# Patient Record
Sex: Male | Born: 1950
Health system: Southern US, Community
[De-identification: ages and names within clinical notes are randomized; demographics above are authoritative.]

## PROBLEM LIST (undated history)

## (undated) ENCOUNTER — Emergency Department (HOSPITAL_COMMUNITY): Discharge status: Against Medical Advice | Payer: BC Managed Care – PPO

## (undated) DIAGNOSIS — D509 Iron deficiency anemia, unspecified: Secondary | ICD-10-CM

## (undated) DIAGNOSIS — I739 Peripheral vascular disease, unspecified: Secondary | ICD-10-CM

## (undated) DIAGNOSIS — E785 Hyperlipidemia, unspecified: Secondary | ICD-10-CM

## (undated) DIAGNOSIS — I10 Essential (primary) hypertension: Secondary | ICD-10-CM

## (undated) DIAGNOSIS — K7460 Unspecified cirrhosis of liver: Secondary | ICD-10-CM

## (undated) DIAGNOSIS — R161 Splenomegaly, not elsewhere classified: Secondary | ICD-10-CM

## (undated) DIAGNOSIS — K648 Other hemorrhoids: Secondary | ICD-10-CM

## (undated) DIAGNOSIS — N200 Calculus of kidney: Secondary | ICD-10-CM

## (undated) DIAGNOSIS — E119 Type 2 diabetes mellitus without complications: Secondary | ICD-10-CM

## (undated) DIAGNOSIS — M7918 Myalgia, other site: Secondary | ICD-10-CM

## (undated) DIAGNOSIS — D126 Benign neoplasm of colon, unspecified: Secondary | ICD-10-CM

## (undated) DIAGNOSIS — K900 Celiac disease: Secondary | ICD-10-CM

## (undated) DIAGNOSIS — G4730 Sleep apnea, unspecified: Secondary | ICD-10-CM

## (undated) DIAGNOSIS — K2990 Gastroduodenitis, unspecified, without bleeding: Secondary | ICD-10-CM

## (undated) DIAGNOSIS — T7840XA Allergy, unspecified, initial encounter: Secondary | ICD-10-CM

## (undated) HISTORY — DX: Essential (primary) hypertension: I10

## (undated) HISTORY — PX: POLYPECTOMY: SHX149

## (undated) HISTORY — PX: SPINE SURGERY: SHX786

## (undated) HISTORY — DX: Peripheral vascular disease, unspecified: I73.9

## (undated) HISTORY — DX: Other hemorrhoids: K64.8

## (undated) HISTORY — DX: Iron deficiency anemia, unspecified: D50.9

## (undated) HISTORY — PX: JOINT REPLACEMENT: SHX530

## (undated) HISTORY — DX: Benign neoplasm of colon, unspecified: D12.6

## (undated) HISTORY — DX: Type 2 diabetes mellitus without complications: E11.9

## (undated) HISTORY — DX: Calculus of kidney: N20.0

## (undated) HISTORY — DX: Gastroduodenitis, unspecified, without bleeding: K29.90

## (undated) HISTORY — PX: COLONOSCOPY: SHX174

## (undated) HISTORY — DX: Splenomegaly, not elsewhere classified: R16.1

## (undated) HISTORY — DX: Celiac disease: K90.0

## (undated) HISTORY — DX: Sleep apnea, unspecified: G47.30

## (undated) HISTORY — DX: Hyperlipidemia, unspecified: E78.5

## (undated) HISTORY — DX: Allergy, unspecified, initial encounter: T78.40XA

## (undated) HISTORY — DX: Unspecified cirrhosis of liver: K74.60

---
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
## 2000-04-05 ENCOUNTER — Encounter: Admission: RE | Admit: 2000-04-05 | Discharge: 2000-04-05 | Payer: Self-pay | Admitting: Unknown Physician Specialty

## 2000-05-31 ENCOUNTER — Encounter: Admission: RE | Admit: 2000-05-31 | Discharge: 2000-05-31 | Payer: Self-pay | Admitting: Neurosurgery

## 2000-05-31 ENCOUNTER — Encounter: Payer: Self-pay | Admitting: Neurosurgery

## 2000-06-14 ENCOUNTER — Encounter: Payer: Self-pay | Admitting: Neurosurgery

## 2000-06-14 ENCOUNTER — Encounter: Admission: RE | Admit: 2000-06-14 | Discharge: 2000-06-14 | Payer: Self-pay | Admitting: Neurosurgery

## 2002-05-08 ENCOUNTER — Encounter: Payer: Self-pay | Admitting: Internal Medicine

## 2002-05-08 ENCOUNTER — Ambulatory Visit (HOSPITAL_COMMUNITY): Admission: RE | Admit: 2002-05-08 | Discharge: 2002-05-08 | Payer: Self-pay | Admitting: Internal Medicine

## 2002-06-25 ENCOUNTER — Ambulatory Visit (HOSPITAL_COMMUNITY): Admission: RE | Admit: 2002-06-25 | Discharge: 2002-06-25 | Payer: Self-pay | Admitting: Gastroenterology

## 2002-06-25 ENCOUNTER — Encounter: Payer: Self-pay | Admitting: Gastroenterology

## 2002-07-30 ENCOUNTER — Encounter: Payer: Self-pay | Admitting: Orthopedic Surgery

## 2002-08-05 ENCOUNTER — Encounter: Payer: Self-pay | Admitting: Orthopedic Surgery

## 2002-08-05 ENCOUNTER — Inpatient Hospital Stay (HOSPITAL_COMMUNITY): Admission: RE | Admit: 2002-08-05 | Discharge: 2002-08-09 | Payer: Self-pay | Admitting: Orthopedic Surgery

## 2002-08-29 ENCOUNTER — Emergency Department (HOSPITAL_COMMUNITY): Admission: EM | Admit: 2002-08-29 | Discharge: 2002-08-29 | Payer: Self-pay | Admitting: Emergency Medicine

## 2002-08-29 ENCOUNTER — Encounter: Payer: Self-pay | Admitting: Emergency Medicine

## 2004-11-10 ENCOUNTER — Ambulatory Visit (HOSPITAL_COMMUNITY): Admission: RE | Admit: 2004-11-10 | Discharge: 2004-11-10 | Payer: Self-pay | Admitting: Family Medicine

## 2004-11-10 IMAGING — US US ABDOMEN COMPLETE
1 series · 14 of 25 positions shown · non-contrast
Comparison: none

CLINICAL DATA: Elevated LFT?s. Known fatty liver.
 ABDOMEN ULTRASOUND:
TECHNIQUE: Complete abdominal ultrasound examination was performed including evaluation of the liver, gallbladder, bile ducts, pancreas, kidneys, spleen, IVC, and abdominal aorta.

[Series 1: unknown · 0.37mm/px · 14 of 87 slices shown]
[im 1/87]
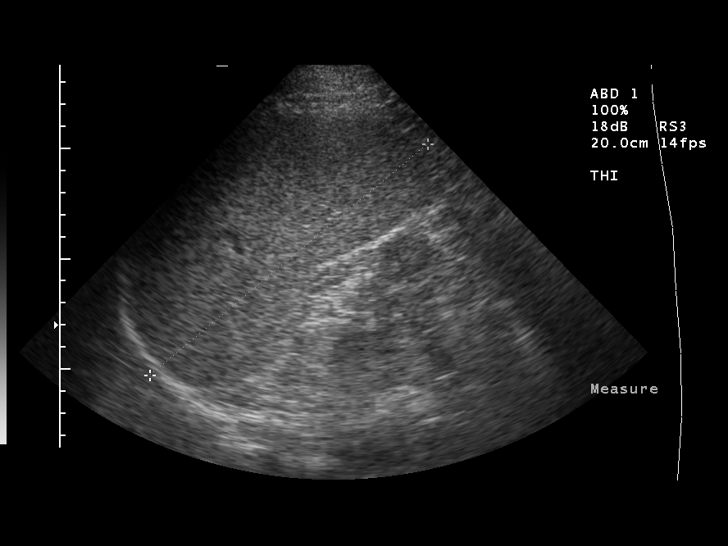
[im 8/87]
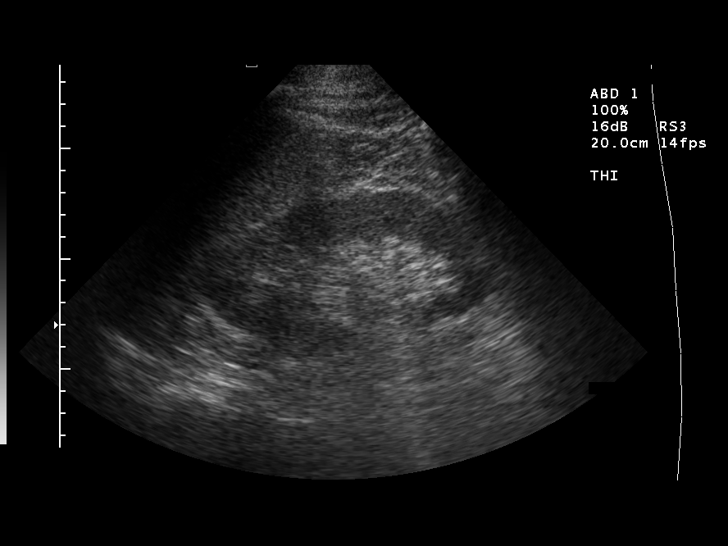
[im 15/87]
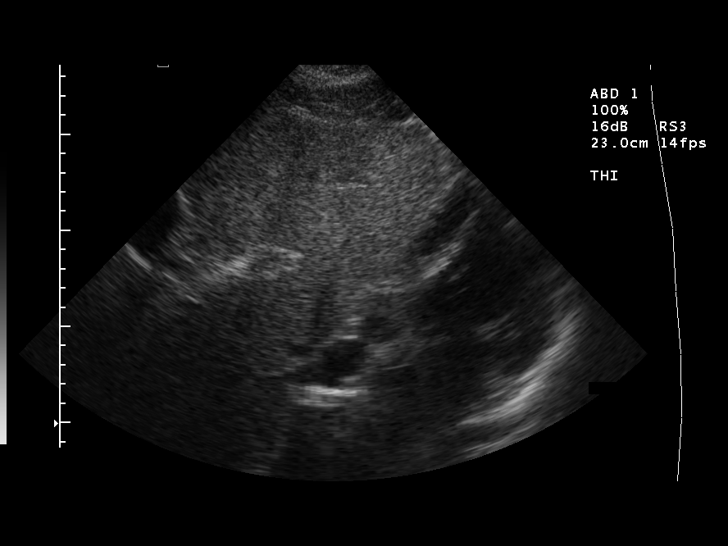
[im 22/87]
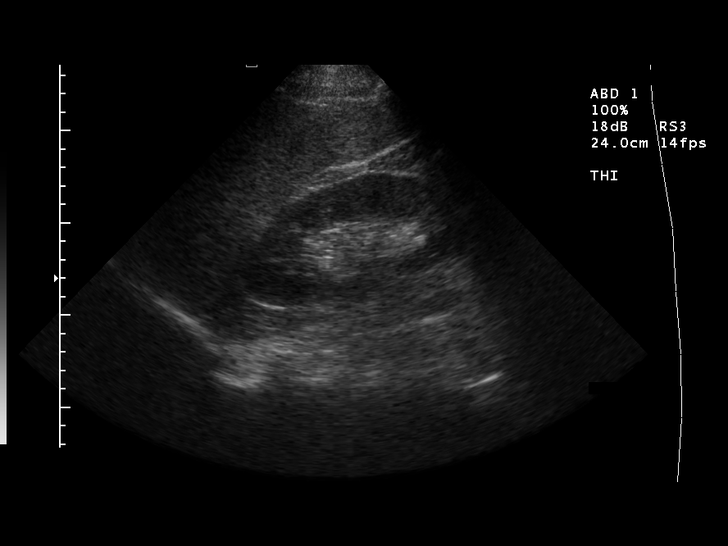
[im 29/87]
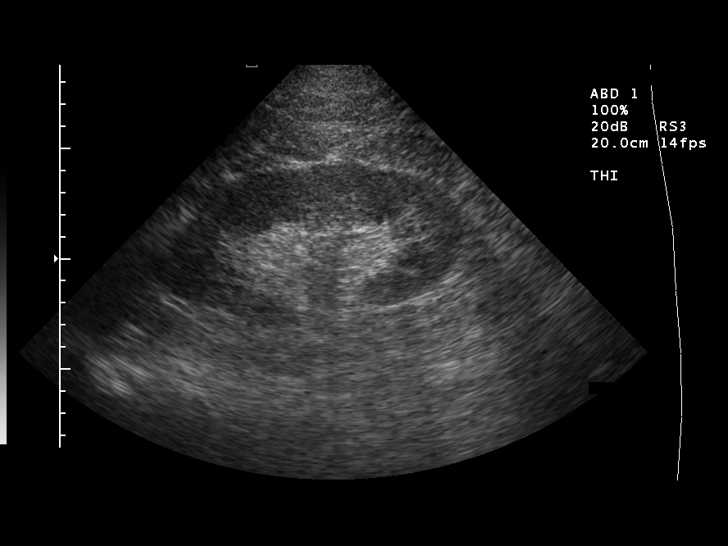
[im 33/87]
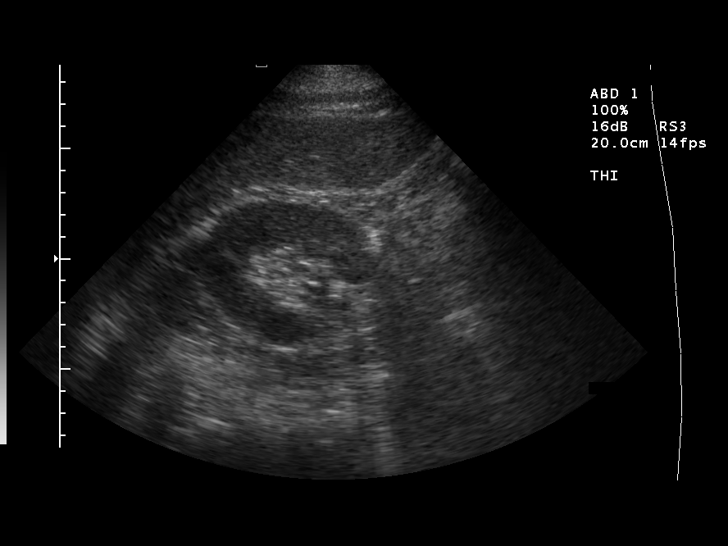
[im 40/87]
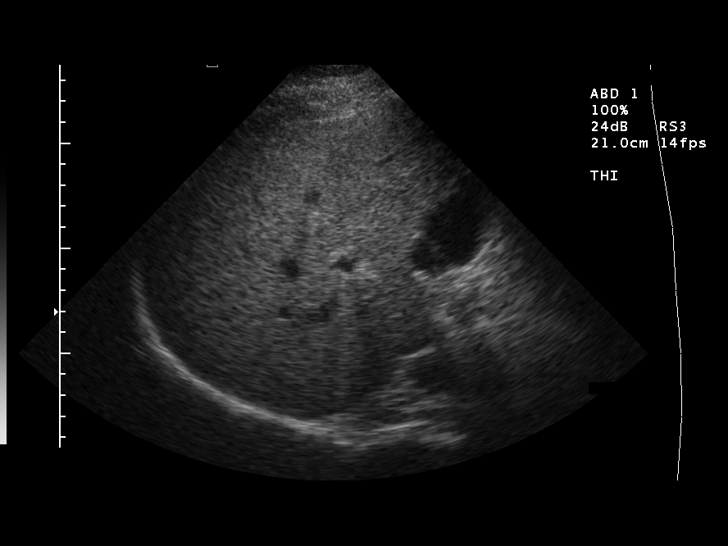
[im 47/87]
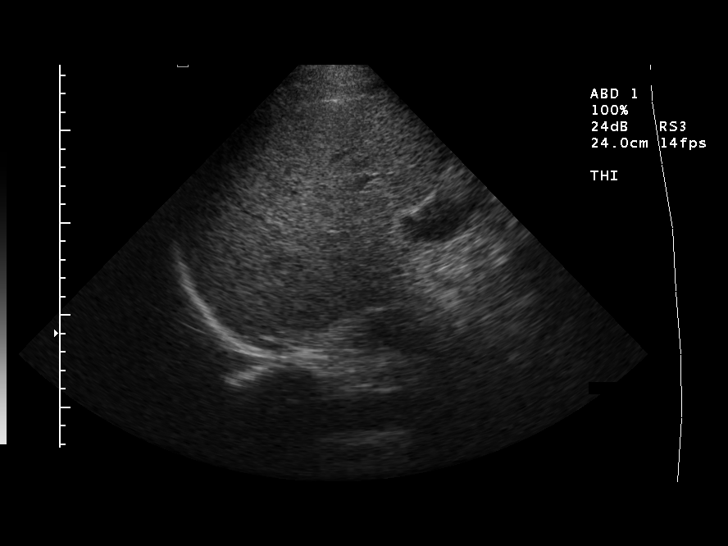
[im 54/87]
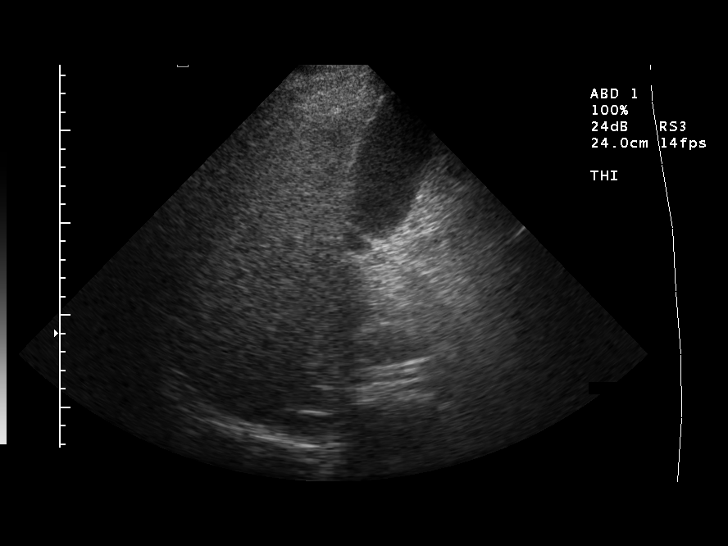
[im 58/87]
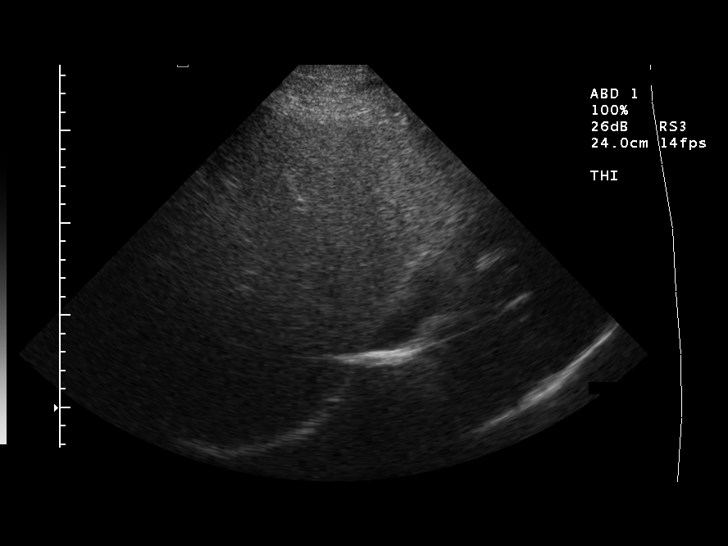
[im 65/87]
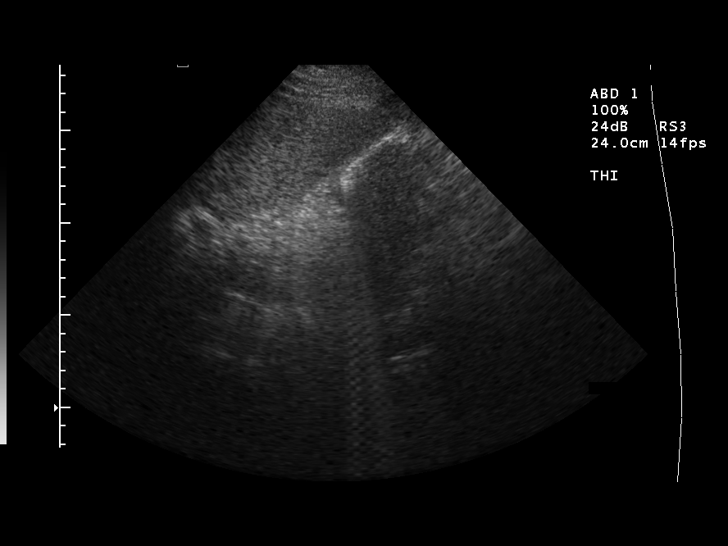
[im 72/87]
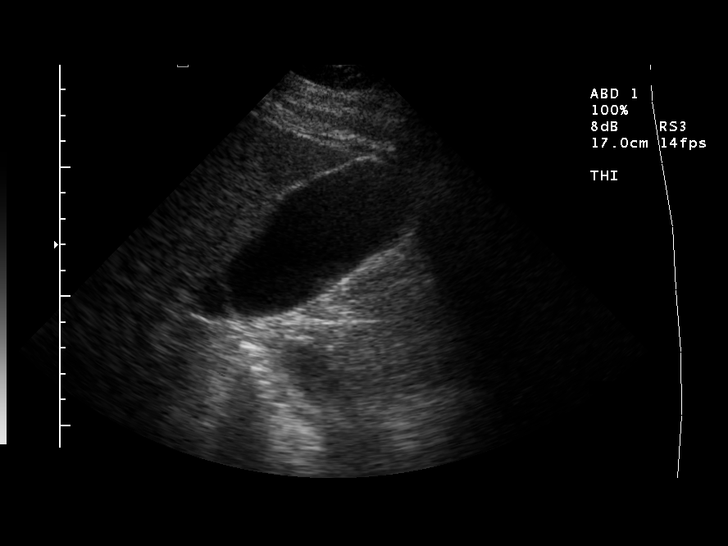
[im 79/87]
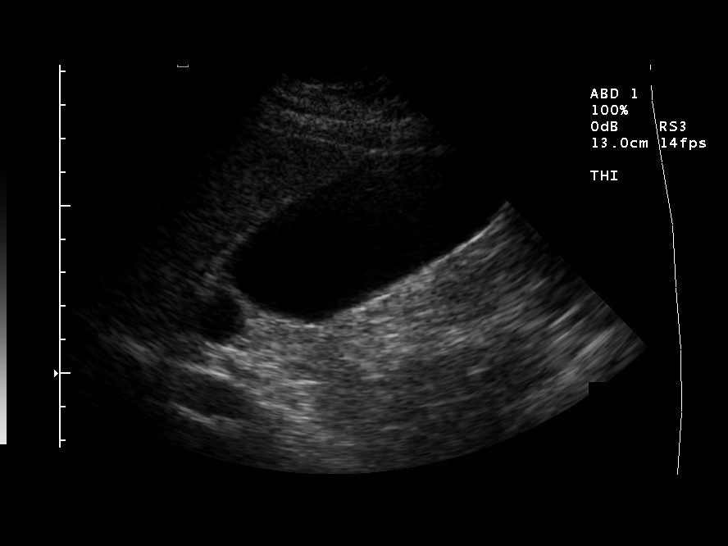
[im 87/87]
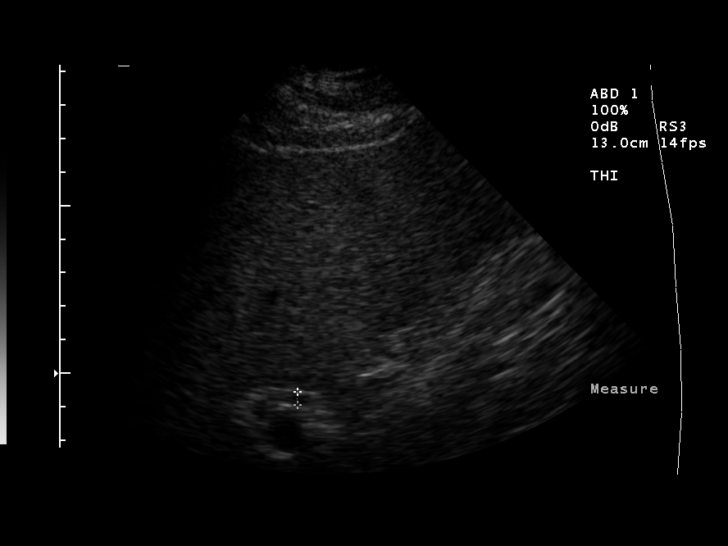

[14 of 25 positions shown; findings below may reference images not displayed]

FINDINGS: Multiple scans of the abdomen show the gallbladder to be normal with a wall thickness of 2.6 mm.  The common bile duct is normal and measures 3.9 mm.  The liver is echogenic in a generalized fashion consistent with fatty infiltration. Inferior vena cava appears normal.  The pancreas is poorly seen due to overlying gas. Spleen is slightly prominent in size and measures 16.4 cm in maximum diameter.  The right kidney measures 15.0 cm and appears normal.  The left kidney is normal and measures 13.7 cm.  The abdominal aorta is normal and measures 2.5 cm.
IMPRESSION: Liver is echogenic suggesting fatty infiltration with no focal masses. Spleen is at the upper limits of normal.  Pancreas is poorly seen due to overlying gas.

## 2006-04-19 ENCOUNTER — Ambulatory Visit (HOSPITAL_COMMUNITY): Admission: RE | Admit: 2006-04-19 | Discharge: 2006-04-19 | Payer: Self-pay | Admitting: Family Medicine

## 2006-04-19 IMAGING — US US EXTREM LOW VENOUS*R*
1 series · 14 of 22 positions shown · non-contrast
Comparison: None.

CLINICAL DATA: Swelling,  cellulitis.
 RIGHT LOWER EXTREMITY VENOUS DOPPLER ULTRASOUND:
TECHNIQUE: Gray-scale sonography with compression, as well as color and duplex Doppler ultrasound, were performed to evaluate the deep venous system from the level of the common femoral vein through the popliteal and proximal calf veins.

[Series 1: us extrem low venous*right* · 14 of 22 slices shown]
[im 1/22]
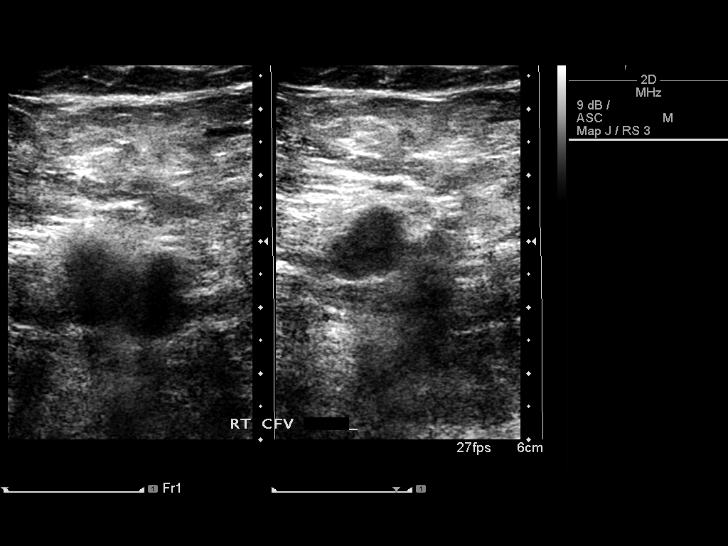
[im 3/22]
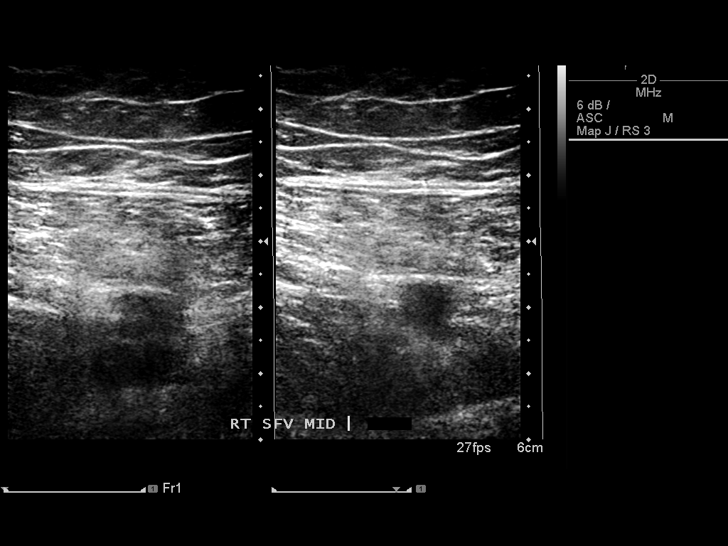
[im 4/22]
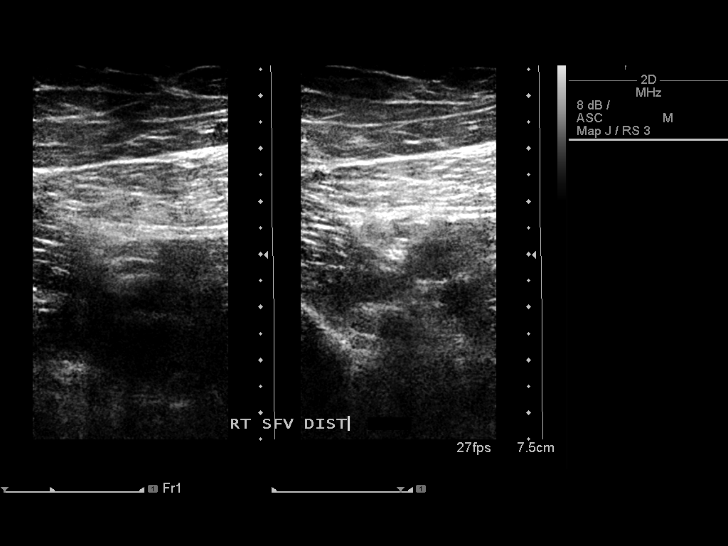
[im 6/22]
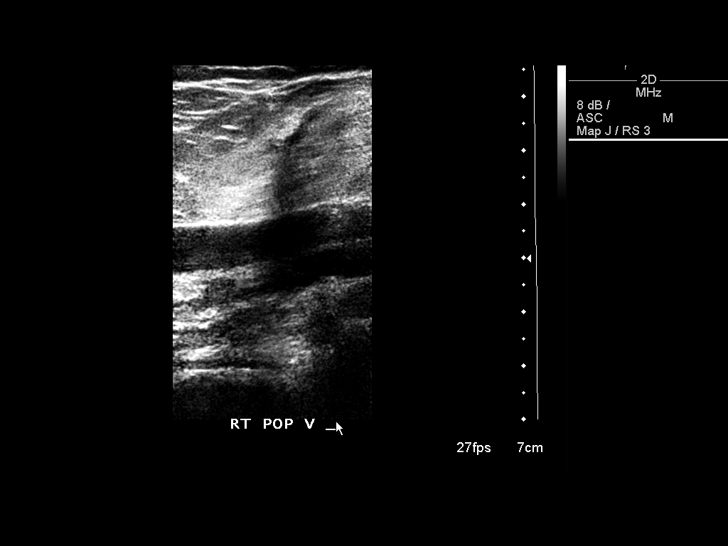
[im 8/22]
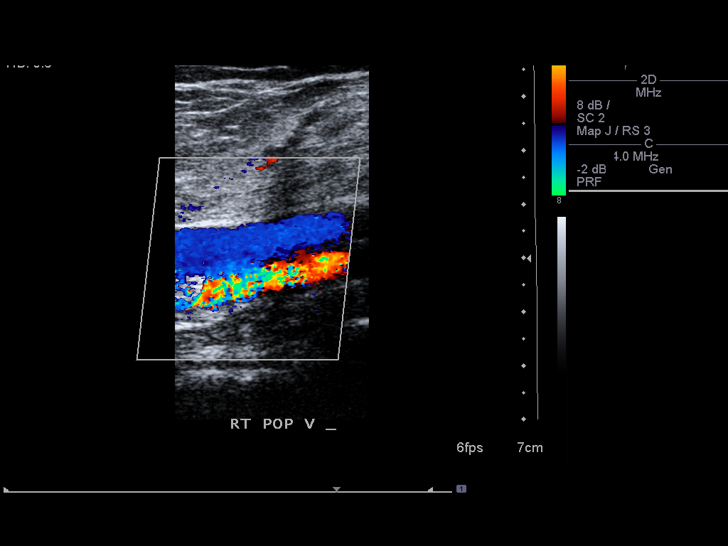
[im 9/22]
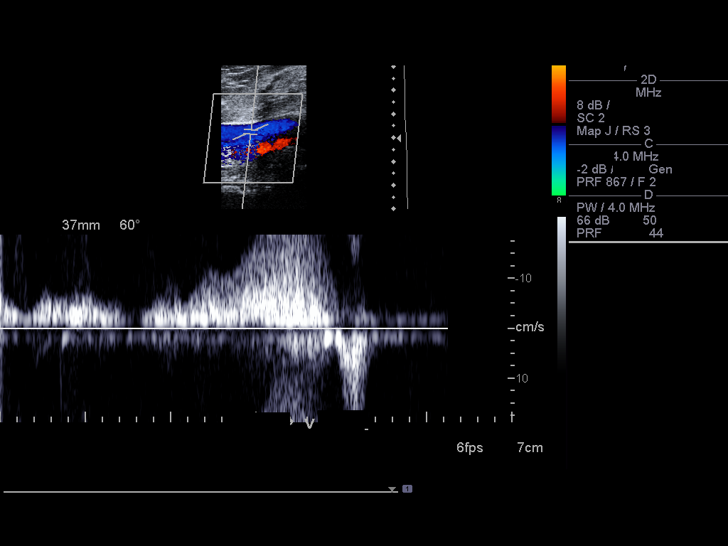
[im 11/22]
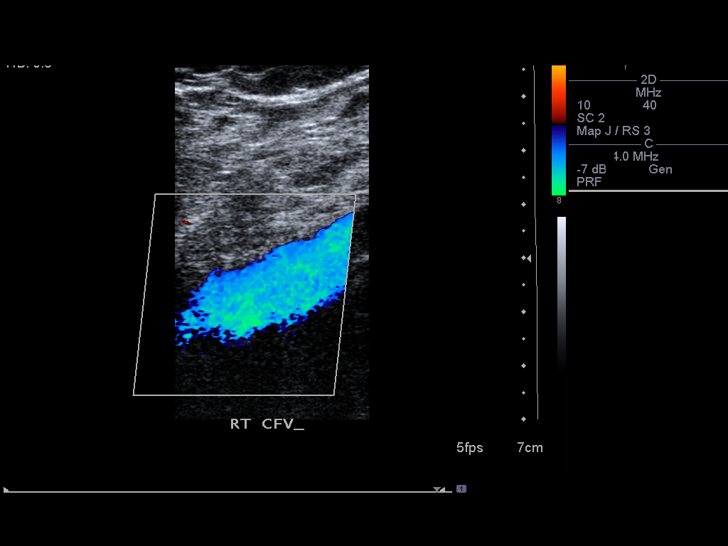
[im 12/22]
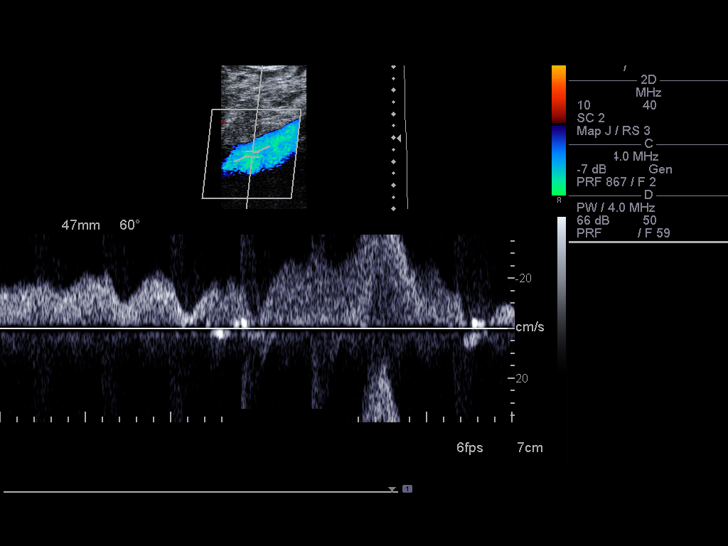
[im 14/22]
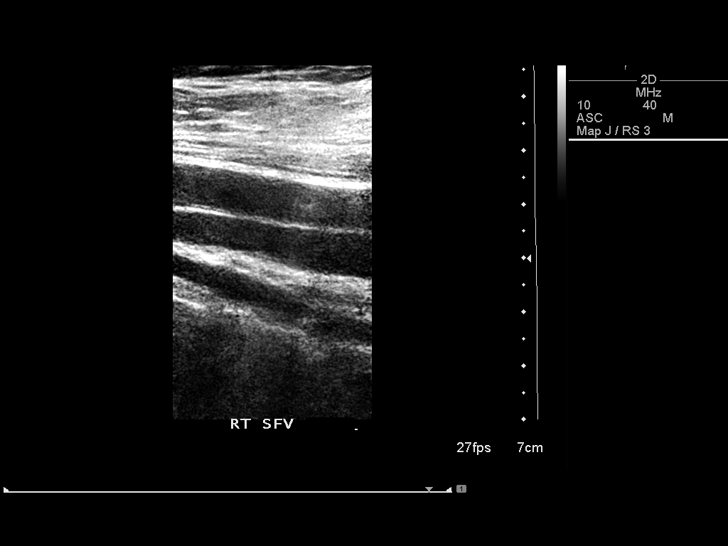
[im 15/22]
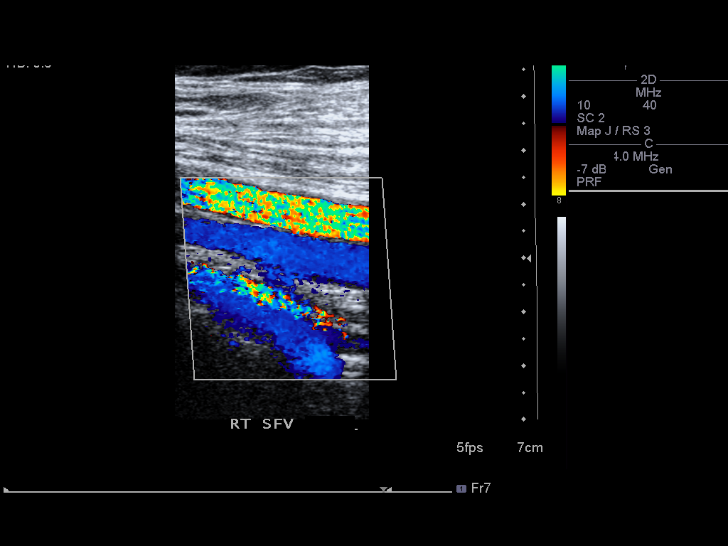
[im 17/22]
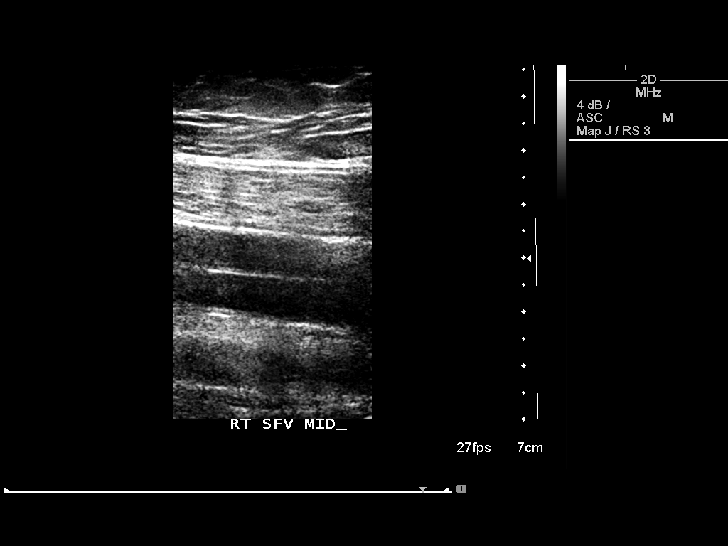
[im 19/22]
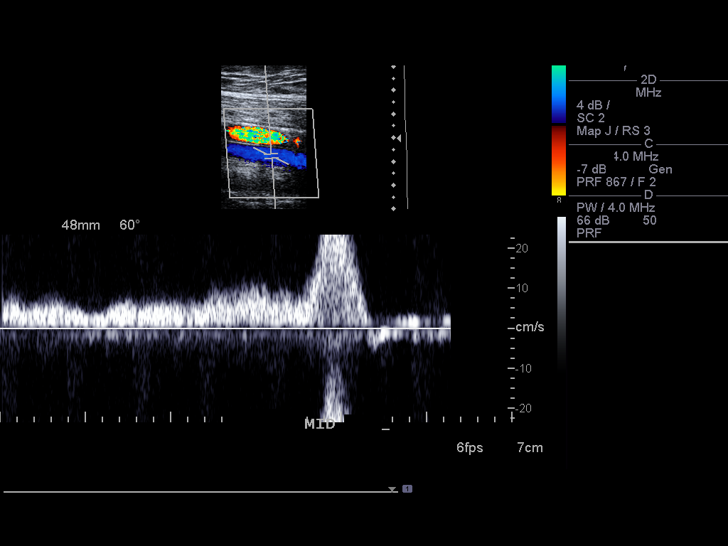
[im 20/22]
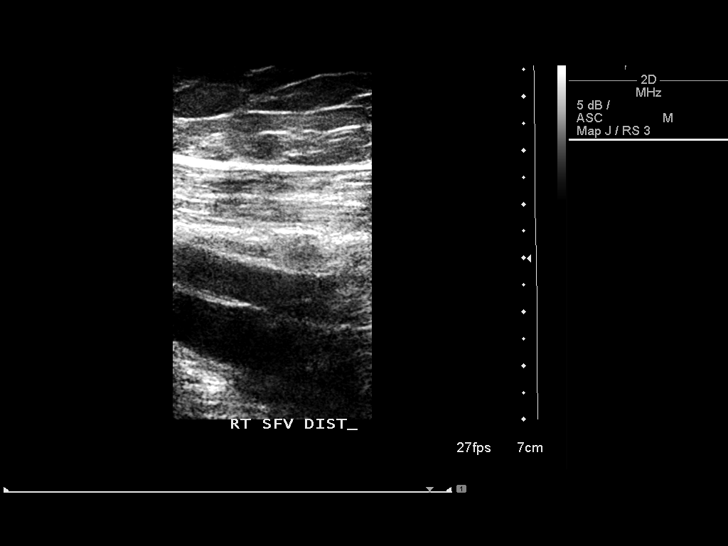
[im 22/22]
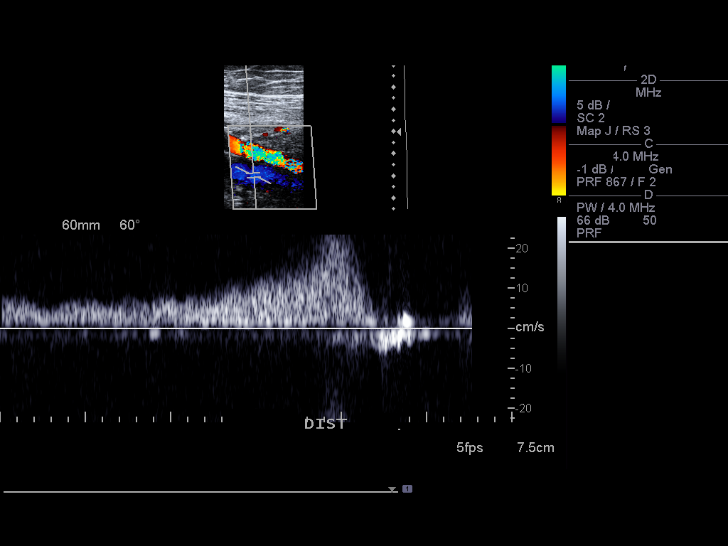

[14 of 22 positions shown; findings below may reference images not displayed]

FINDINGS: There is normal compressibility, phasicity, and augmentation of flow in the superficial and common femoral and popliteal veins.
IMPRESSION: Negative for deep venous thrombosis.

## 2012-12-08 ENCOUNTER — Ambulatory Visit (HOSPITAL_COMMUNITY)
Admission: RE | Admit: 2012-12-08 | Discharge: 2012-12-08 | Disposition: A | Discharge status: Home / Self Care | Payer: BC Managed Care – PPO | Source: Ambulatory Visit | Attending: Family Medicine | Admitting: Family Medicine

## 2012-12-08 ENCOUNTER — Other Ambulatory Visit (HOSPITAL_COMMUNITY): Payer: Self-pay | Admitting: Family Medicine

## 2012-12-08 DIAGNOSIS — N201 Calculus of ureter: Secondary | ICD-10-CM | POA: Insufficient documentation

## 2012-12-08 DIAGNOSIS — R319 Hematuria, unspecified: Secondary | ICD-10-CM | POA: Insufficient documentation

## 2012-12-08 DIAGNOSIS — N200 Calculus of kidney: Secondary | ICD-10-CM | POA: Insufficient documentation

## 2012-12-08 DIAGNOSIS — R1032 Left lower quadrant pain: Secondary | ICD-10-CM | POA: Insufficient documentation

## 2012-12-08 DIAGNOSIS — R109 Unspecified abdominal pain: Secondary | ICD-10-CM

## 2012-12-08 DIAGNOSIS — R161 Splenomegaly, not elsewhere classified: Secondary | ICD-10-CM | POA: Insufficient documentation

## 2012-12-08 DIAGNOSIS — K7689 Other specified diseases of liver: Secondary | ICD-10-CM | POA: Insufficient documentation

## 2012-12-08 DIAGNOSIS — Z87442 Personal history of urinary calculi: Secondary | ICD-10-CM | POA: Insufficient documentation

## 2012-12-08 DIAGNOSIS — K7460 Unspecified cirrhosis of liver: Secondary | ICD-10-CM | POA: Insufficient documentation

## 2012-12-08 IMAGING — CT CT ABD-PELV W/O CM
2 of 4 series · 16 of 46 positions shown, 18 images · non-contrast
Comparison: None.

ADDENDUM:
Contacted by referring physician for additional view of this study.

By report, the patient has a history of left renal abnormality.
However, no abnormality is evident on unenhanced CT (aside from the
2 mm nonobstructing lower pole calculus noted in the original
report).
A possible 13 mm cyst is present in the right lower kidney (series
2/ image 50), although poorly visualized.
CLINICAL DATA: Left flank pain, hematuria, history of kidney stones
EXAM:
CT ABDOMEN AND PELVIS WITHOUT CONTRAST
TECHNIQUE: Multidetector CT imaging of the abdomen and pelvis was performed
following the standard protocol without intravenous contrast.

[Series 2: standard/full over (age)lbs 5.0 · axial · 0.95mm/px · z∈[-515,-30]mm · 13 of 107 slices shown, 15 images]
[im 5/107  soft-tissue]
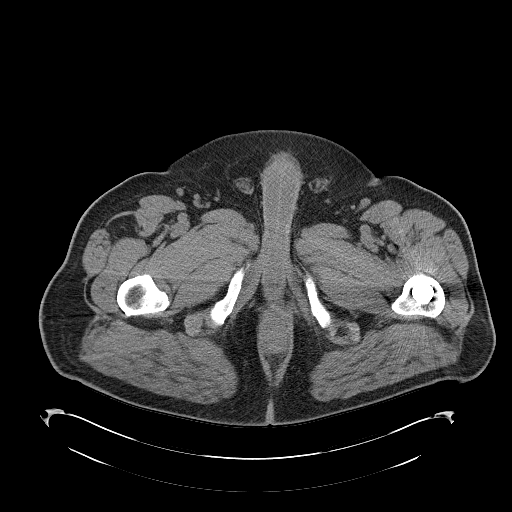
[im 5/107  bone]
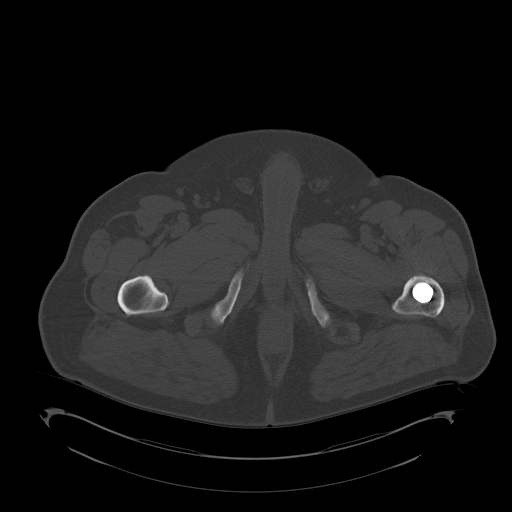
[im 14/107  soft-tissue]
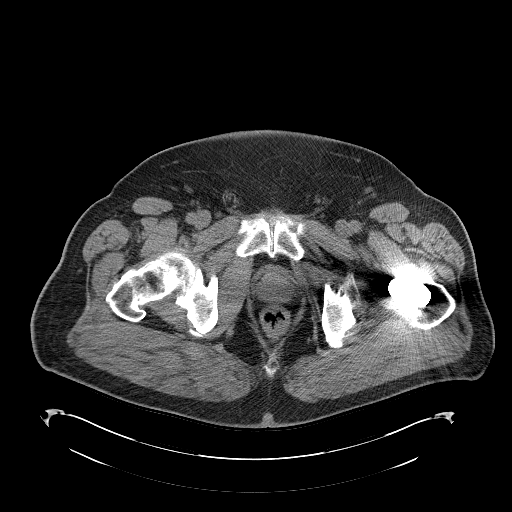
[im 23/107  soft-tissue]
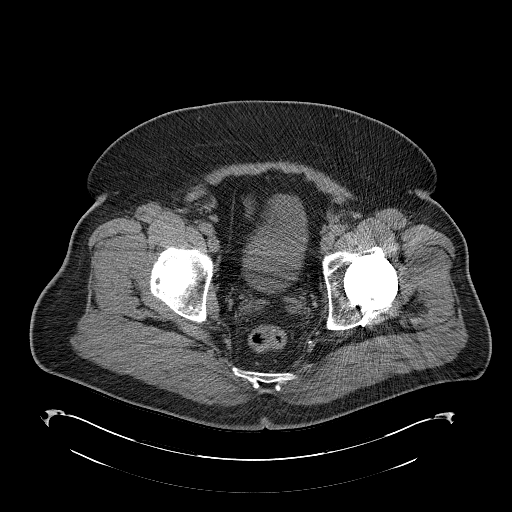
[im 31/107  soft-tissue]
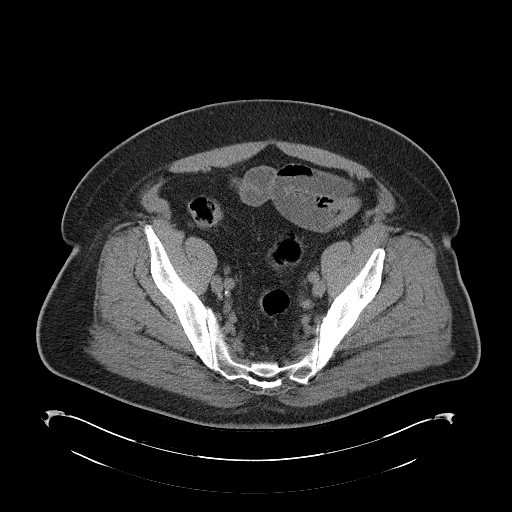
[im 36/107  soft-tissue]
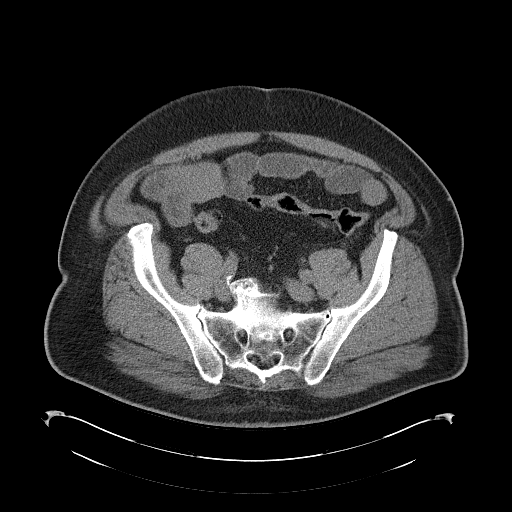
[im 45/107  soft-tissue]
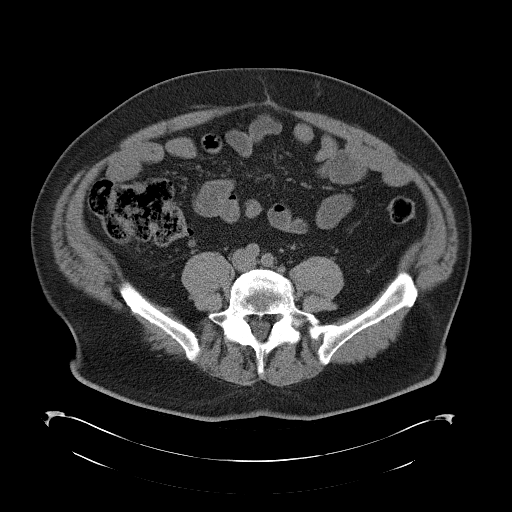
[im 54/107  soft-tissue]
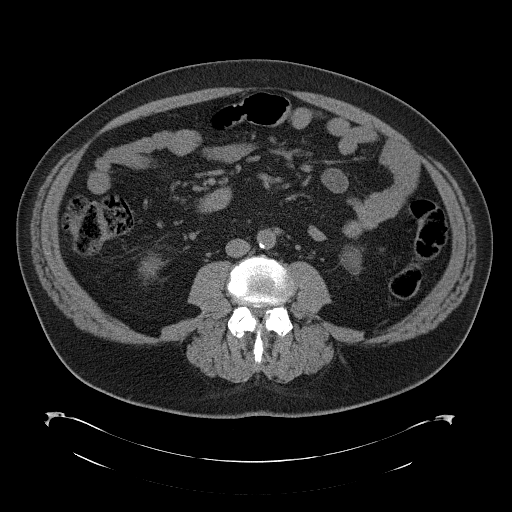
[im 62/107  soft-tissue]
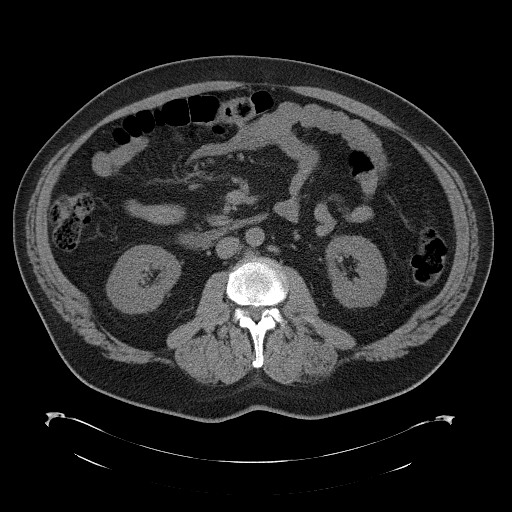
[im 71/107  soft-tissue]
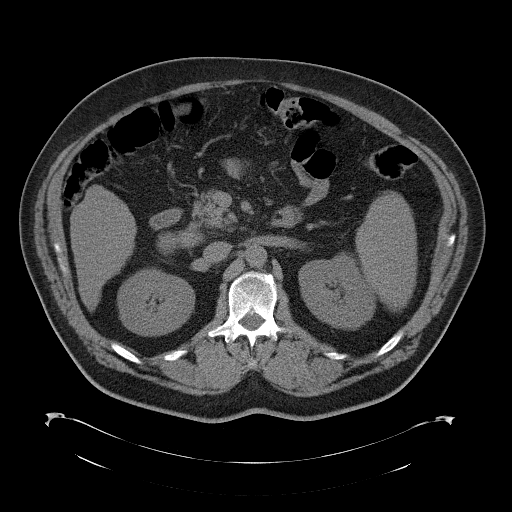
[im 71/107  bone]
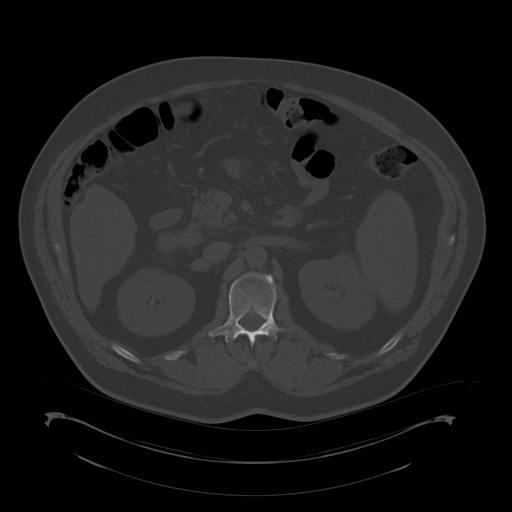
[im 76/107  soft-tissue]
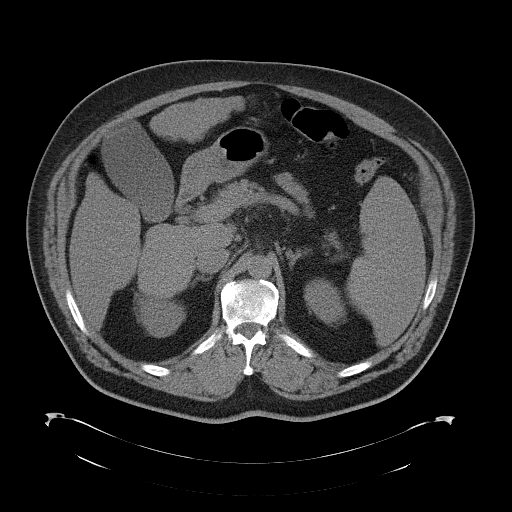
[im 84/107  soft-tissue]
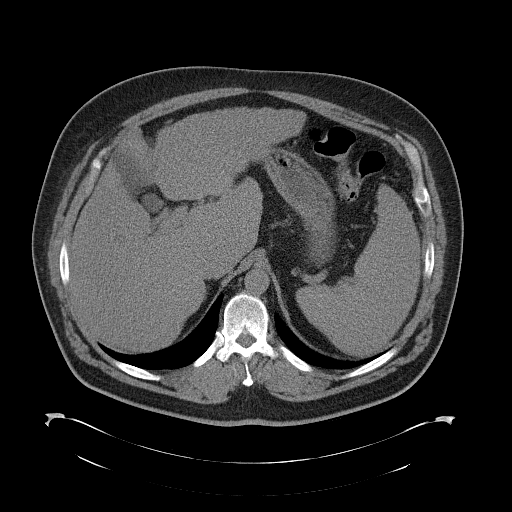
[im 93/107  soft-tissue]
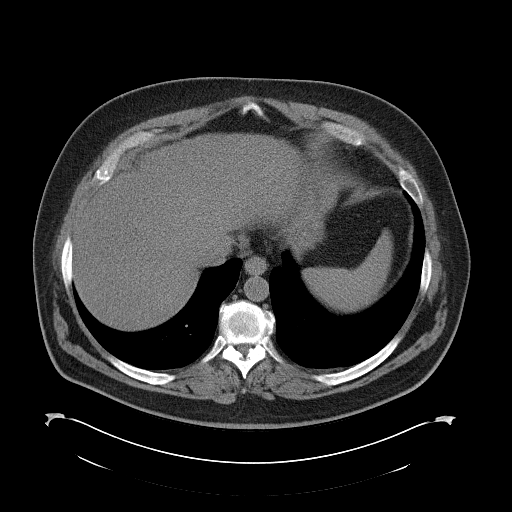
[im 102/107  soft-tissue]
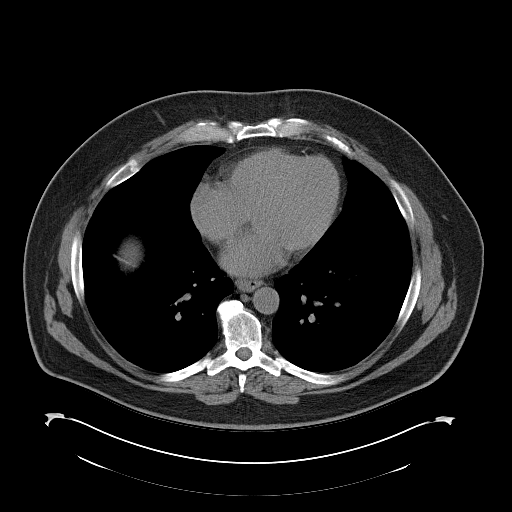

[Series 4: mpr coronal · coronal · 0.91mm/px · 3 of 121 slices shown]
[im 41/121  soft-tissue]
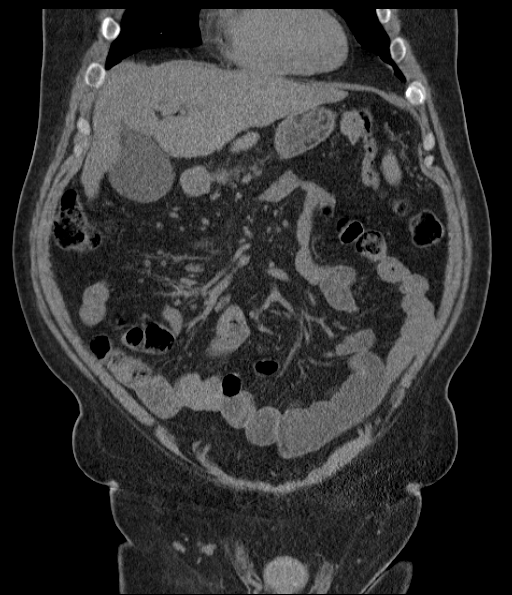
[im 54/121  soft-tissue]
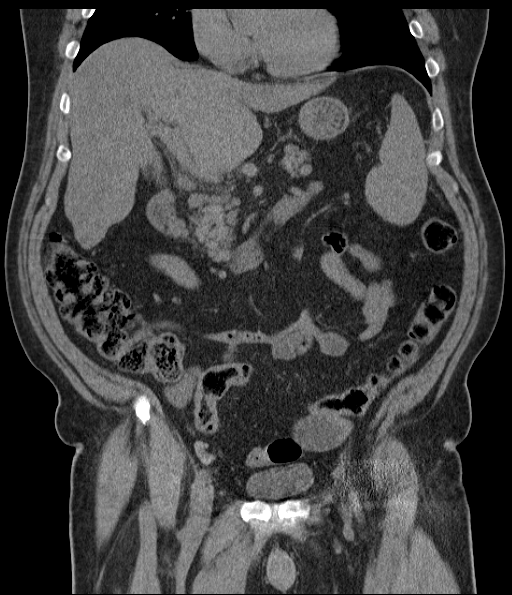
[im 67/121  soft-tissue]
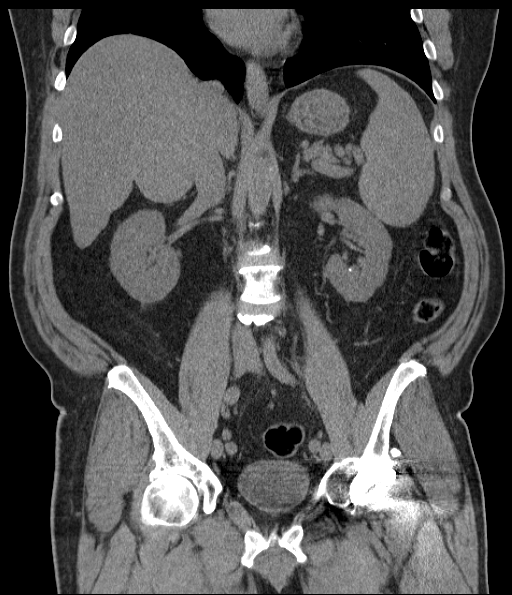

[16 of 46 positions shown; findings below may reference images not displayed]

FINDINGS: Lung bases are clear.

Cirrhotic configuration of the liver with nodular hepatic contour.
Superimposed mild hepatic steatosis.

Spleen is a mildly enlarged, measuring 14.5 cm in maximal dimension.

Pancreas and adrenal glands are unremarkable.

Gallbladder is mildly distended. No intrahepatic or extrahepatic
ductal dilatation.

Right kidney is within normal limits. 2 mm nonobstructing left lower
pole renal calculus (coronal image 68). No hydronephrosis.

No evidence of bowel obstruction. Normal appendix.

No evidence of abdominal aortic aneurysm.

No abdominopelvic ascites.

Small upper abdominal/retroperitoneal lymph nodes which do not meet
pathologic CT size criteria.

Prostate is unremarkable.

4 mm distal right ureteral calculus at the UVJ (series 2/ image 90).

Bladder is unremarkable.

Degenerative changes of the visualized thoracolumbar spine.
IMPRESSION: 4 mm distal right ureteral calculus at the UVJ.  No hydronephrosis.

2 mm nonobstructing left lower pole renal calculus. No
hydronephrosis.

Cirrhosis with mild hepatic steatosis. Splenomegaly, suggesting
portal hypertension.

## 2012-12-08 MED ORDER — IOHEXOL 300 MG/ML  SOLN: 100.0000 mL | Freq: Once | INTRAMUSCULAR | Status: AC | PRN

## 2016-02-20 ENCOUNTER — Encounter (INDEPENDENT_AMBULATORY_CARE_PROVIDER_SITE_OTHER): Payer: Self-pay

## 2016-02-20 ENCOUNTER — Other Ambulatory Visit: Payer: Self-pay | Admitting: Family Medicine

## 2016-02-20 ENCOUNTER — Encounter: Payer: Self-pay | Admitting: Family Medicine

## 2016-02-20 ENCOUNTER — Ambulatory Visit (INDEPENDENT_AMBULATORY_CARE_PROVIDER_SITE_OTHER): Payer: BLUE CROSS/BLUE SHIELD | Admitting: Family Medicine

## 2016-02-20 VITALS — BP 130/70 | HR 76 | Temp 98.4°F | Resp 18 | Ht 72.0 in | Wt 262.0 lb

## 2016-02-20 DIAGNOSIS — Z0000 Encounter for general adult medical examination without abnormal findings: Secondary | ICD-10-CM

## 2016-02-20 DIAGNOSIS — Z125 Encounter for screening for malignant neoplasm of prostate: Secondary | ICD-10-CM

## 2016-02-20 DIAGNOSIS — E7800 Pure hypercholesterolemia, unspecified: Secondary | ICD-10-CM

## 2016-02-20 DIAGNOSIS — Z7689 Persons encountering health services in other specified circumstances: Secondary | ICD-10-CM | POA: Diagnosis not present

## 2016-02-20 DIAGNOSIS — Z23 Encounter for immunization: Secondary | ICD-10-CM | POA: Diagnosis not present

## 2016-02-20 DIAGNOSIS — Z1211 Encounter for screening for malignant neoplasm of colon: Secondary | ICD-10-CM | POA: Diagnosis not present

## 2016-02-20 DIAGNOSIS — I10 Essential (primary) hypertension: Secondary | ICD-10-CM

## 2016-02-20 DIAGNOSIS — E119 Type 2 diabetes mellitus without complications: Secondary | ICD-10-CM | POA: Diagnosis not present

## 2016-02-20 NOTE — Addendum Note (Signed)
Addended by: Shary Decamp B on: 02/20/2016 04:33 PM   Modules accepted: Orders

## 2016-02-20 NOTE — Progress Notes (Signed)
Subjective:    Patient ID: Joel Reed, male    DOB: Apr 19, 1950, 66 y.o.   MRN: [id number]  HPI  Patient is a 66 year old white male here today to establish care and for complete physical exam. His last colonoscopy was in 2005 and is due. He is overdue for a PSA for prostate cancer screening. He has never had Pneumovax 23 or a flu shot. He's never had Prevnar 13. He's never had the shingles vaccine. He has history of hypertension, hyperlipidemia, diabetes mellitus type 2. He is due today for fasting lab work. Otherwise he is doing well. Diabetic eye exam was performed last March and is due again this March. Diabetic foot exam is performed today and is normal outside of some pitting edema and venous stasis changes. Past Medical History:  Diagnosis Date  . Diabetes mellitus without complication ([place])   . Hyperlipidemia   . Hypertension   . Nephrolithiasis    Past Surgical History:  Procedure Laterality Date  . JOINT REPLACEMENT     left hip Dr Noemi Chapel  . SPINE SURGERY     disectomy   No current outpatient prescriptions on file prior to visit.   No current facility-administered medications on file prior to visit.    No Known Allergies Social History   Social History  . Marital status: Single    Spouse name: N/A  . Number of children: N/A  . Years of education: N/A   Occupational History  . Not on file.   Social History Main Topics  . Smoking status: Never Smoker  . Smokeless tobacco: Never Used  . Alcohol use No  . Drug use: No  . Sexual activity: Not on file   Other Topics Concern  . Not on file   Social History Narrative  . No narrative on file   No family history on file. Father died from throat cancer  Review of Systems     Objective:   Physical Exam  Constitutional: He is oriented to person, place, and time. He appears well-developed and well-nourished. No distress.  HENT:  Head: Normocephalic and atraumatic.  Right Ear: External ear normal.  Left  Ear: External ear normal.  Nose: Nose normal.  Mouth/Throat: Oropharynx is clear and moist. No oropharyngeal exudate.  Eyes: Conjunctivae and EOM are normal. Pupils are equal, round, and reactive to light. Right eye exhibits no discharge. Left eye exhibits no discharge. No scleral icterus.  Neck: Neck supple. No JVD present. No thyromegaly present.  Cardiovascular: Normal rate, regular rhythm, normal heart sounds and intact distal pulses.  Exam reveals no gallop and no friction rub.   No murmur heard. Pulmonary/Chest: Effort normal and breath sounds normal. No respiratory distress. He has no wheezes. He has no rales. He exhibits no tenderness.  Abdominal: Soft. Bowel sounds are normal. He exhibits no distension and no mass. There is no tenderness. There is no rebound and no guarding.  Musculoskeletal: Normal range of motion. He exhibits no edema, tenderness or deformity.  Lymphadenopathy:    He has no cervical adenopathy.  Neurological: He is alert and oriented to person, place, and time. He has normal reflexes. He displays normal reflexes. No cranial nerve deficit. He exhibits normal muscle tone. Coordination normal.  Skin: Skin is warm and dry. No rash noted. He is not diaphoretic. No erythema. No pallor.  Psychiatric: He has a normal mood and affect. His behavior is normal. Judgment and thought content normal.  Vitals reviewed.  Assessment & Plan:  Establishing care with new doctor, encounter for  Benign essential HTN  Controlled type 2 diabetes mellitus without complication, without long-term current use of insulin ([place]) - Plan: CBC with Differential/Platelet, COMPLETE METABOLIC PANEL WITH GFR, Lipid panel, Hemoglobin A1c, Microalbumin, urine  Pure hypercholesterolemia - Plan: COMPLETE METABOLIC PANEL WITH GFR, Lipid panel  Prostate cancer screening - Plan: PSA  Routine general medical examination at a health care facility  Colon cancer screening - Plan: Ambulatory  referral to Gastroenterology   Blood pressure today is well controlled. I will check a hemoglobin A1c. Goal hemoglobin A1c is less than 6.5. I did recommend changing Amaryl 2 Jardiance or the toes and to help facilitate weight loss. I recommended Pneumovax 23 and a flu shot. He agreed to the flu shot. He deferred Pneumovax 23. I'll schedule the patient for a colonoscopy. A PSA will be checked today. Hepatitis see screening is up-to-date. Diabetic eye exam and foot exam are up-to-date. I will also check a fasting lipid panel. Goal LDL cholesterol is less than 100.

## 2016-02-21 LAB — COMPLETE METABOLIC PANEL WITH GFR
ALT: 72 U/L — ABNORMAL HIGH (ref 9–46)
AST: 48 U/L — ABNORMAL HIGH (ref 10–35)
Albumin: 4.1 g/dL (ref 3.6–5.1)
Alkaline Phosphatase: 80 U/L (ref 40–115)
BUN: 14 mg/dL (ref 7–25)
CO2: 28 mmol/L (ref 20–31)
Calcium: 8.8 mg/dL (ref 8.6–10.3)
Chloride: 104 mmol/L (ref 98–110)
Creat: 0.72 mg/dL (ref 0.70–1.25)
GFR, Est African American: >89 mL/min (ref >=60)
GFR, Est Non African American: >89 mL/min (ref >=60)
Glucose, Bld: 100 mg/dL — ABNORMAL HIGH (ref 70–99)
Potassium: 4.1 mmol/L (ref 3.5–5.3)
Sodium: 139 mmol/L (ref 135–146)
Total Bilirubin: 0.9 mg/dL (ref 0.2–1.2)
Total Protein: 7.7 g/dL (ref 6.1–8.1)

## 2016-02-21 LAB — LIPID PANEL
Cholesterol: 115 mg/dL (ref <200)
HDL: 30 mg/dL — ABNORMAL LOW (ref >40)
LDL Cholesterol: 67 mg/dL (ref <100)
Total CHOL/HDL Ratio: 3.8 Ratio (ref <5.0)
Triglycerides: 91 mg/dL (ref <150)
VLDL: 18 mg/dL (ref <30)

## 2016-02-21 LAB — CBC WITH DIFFERENTIAL/PLATELET
Basophils Absolute: 0 cells/uL (ref 0–200)
Basophils Relative: 0 %
Eosinophils Absolute: 98 cells/uL (ref 15–500)
Eosinophils Relative: 2 %
HCT: 41.4 % (ref 38.5–50.0)
Hemoglobin: 14.4 g/dL (ref 13.0–17.0)
Lymphocytes Relative: 33 %
Lymphs Abs: 1617 cells/uL (ref 850–3900)
MCH: 30.8 pg (ref 27.0–33.0)
MCHC: 34.8 g/dL (ref 32.0–36.0)
MCV: 88.5 fL (ref 80.0–100.0)
MPV: 11.6 fL (ref 7.5–12.5)
Monocytes Absolute: 539 cells/uL (ref 200–950)
Monocytes Relative: 11 %
Neutro Abs: 2646 cells/uL (ref 1500–7800)
Neutrophils Relative %: 54 %
Platelets: 154 10*3/uL (ref 140–400)
RBC: 4.68 MIL/uL (ref 4.20–5.80)
RDW: 14.3 % (ref 11.0–15.0)
WBC: 4.9 10*3/uL (ref 3.8–10.8)

## 2016-02-21 LAB — PSA: PSA: 0.2 ng/mL (ref <=4.0)

## 2016-02-21 LAB — HEMOGLOBIN A1C
Hgb A1c MFr Bld: 6.7 % — ABNORMAL HIGH (ref <5.7)
Mean Plasma Glucose: 146 mg/dL

## 2016-02-21 LAB — MICROALBUMIN, URINE: Microalb, Ur: 4.8 mg/dL

## 2016-02-22 LAB — HEPATITIS PANEL, ACUTE
HCV Ab: NEGATIVE
Hep A IgM: NONREACTIVE
Hep B C IgM: NONREACTIVE
Hepatitis B Surface Ag: NEGATIVE

## 2016-03-12 ENCOUNTER — Other Ambulatory Visit: Payer: Self-pay | Admitting: Family Medicine

## 2016-03-12 MED ORDER — GLIMEPIRIDE 4 MG PO TABS: 4.0000 mg | ORAL_TABLET | Freq: Every day | ORAL | 1 refills | Status: DC

## 2016-03-20 ENCOUNTER — Other Ambulatory Visit: Payer: Self-pay | Admitting: Family Medicine

## 2016-03-20 MED ORDER — METFORMIN HCL 500 MG PO TABS: 500.0000 mg | ORAL_TABLET | Freq: Two times a day (BID) | ORAL | 3 refills | Status: DC

## 2016-04-20 ENCOUNTER — Encounter: Payer: Self-pay | Admitting: Family Medicine

## 2016-06-07 ENCOUNTER — Encounter: Payer: Self-pay | Admitting: Internal Medicine

## 2016-07-18 ENCOUNTER — Other Ambulatory Visit: Payer: Self-pay | Admitting: Family Medicine

## 2016-07-18 MED ORDER — PRAVASTATIN SODIUM 20 MG PO TABS: 20.0000 mg | ORAL_TABLET | Freq: Every day | ORAL | 0 refills | Status: DC

## 2016-07-18 MED ORDER — METOPROLOL SUCCINATE ER 25 MG PO TB24: 25.0000 mg | ORAL_TABLET | Freq: Every day | ORAL | 0 refills | Status: DC

## 2016-07-18 MED ORDER — AMLODIPINE BESYLATE 5 MG PO TABS: 5.0000 mg | ORAL_TABLET | Freq: Every day | ORAL | 0 refills | Status: DC

## 2016-07-19 ENCOUNTER — Ambulatory Visit: Payer: BLUE CROSS/BLUE SHIELD

## 2016-07-19 VITALS — Ht 72.0 in | Wt 271.4 lb

## 2016-07-19 DIAGNOSIS — Z1211 Encounter for screening for malignant neoplasm of colon: Secondary | ICD-10-CM

## 2016-07-19 MED ORDER — SUPREP BOWEL PREP KIT 17.5-3.13-1.6 GM/177ML PO SOLN: 1.0000 | Freq: Once | ORAL | 0 refills | Status: AC

## 2016-07-19 NOTE — Progress Notes (Signed)
No allergie to eggs or soy No diet meds No home oxygen No past problems with anesthesia  Declined emmi

## 2016-07-20 ENCOUNTER — Encounter: Payer: Self-pay | Admitting: Internal Medicine

## 2016-07-22 ENCOUNTER — Other Ambulatory Visit: Payer: Self-pay | Admitting: Family Medicine

## 2016-07-25 ENCOUNTER — Telehealth: Payer: Self-pay | Admitting: Internal Medicine

## 2016-07-25 NOTE — Telephone Encounter (Signed)
Unable to get through to [facility] to speak with the person who called [place] I called [facility] and told them we do not call in [place], I asked if the patient has pick up the [place]. The pharmacist was not nice to me and said do we expect the patient to pay the $100, when I told him yes, he said he was going to call Mr.Cureton to see if he would be able to pay this and have the patient call us back.

## 2016-07-25 NOTE — Telephone Encounter (Signed)
Spoke with patient. Prep $150 per pharmacy. He did not get coupon. Faxed coupon to [employer]. Pt aware.

## 2016-08-02 ENCOUNTER — Ambulatory Visit (AMBULATORY_SURGERY_CENTER): Payer: BLUE CROSS/BLUE SHIELD | Admitting: Internal Medicine

## 2016-08-02 ENCOUNTER — Encounter: Payer: Self-pay | Admitting: Internal Medicine

## 2016-08-02 VITALS — BP 130/73 | HR 74 | Temp 97.8°F | Resp 33 | Ht 72.0 in | Wt 271.0 lb

## 2016-08-02 DIAGNOSIS — Z1212 Encounter for screening for malignant neoplasm of rectum: Secondary | ICD-10-CM | POA: Diagnosis not present

## 2016-08-02 DIAGNOSIS — D122 Benign neoplasm of ascending colon: Secondary | ICD-10-CM | POA: Diagnosis not present

## 2016-08-02 DIAGNOSIS — Z1211 Encounter for screening for malignant neoplasm of colon: Secondary | ICD-10-CM

## 2016-08-02 DIAGNOSIS — D128 Benign neoplasm of rectum: Secondary | ICD-10-CM

## 2016-08-02 DIAGNOSIS — D127 Benign neoplasm of rectosigmoid junction: Secondary | ICD-10-CM | POA: Diagnosis not present

## 2016-08-02 DIAGNOSIS — D123 Benign neoplasm of transverse colon: Secondary | ICD-10-CM

## 2016-08-02 MED ORDER — SODIUM CHLORIDE 0.9 % IV SOLN: 500.0000 mL | INTRAVENOUS | Status: DC

## 2016-08-02 NOTE — Progress Notes (Signed)
Report given to PACU, vss 

## 2016-08-02 NOTE — Progress Notes (Signed)
Called to room to assist during endoscopic procedure.  Patient ID and intended procedure confirmed with present staff. Received instructions for my participation in the procedure from the performing physician.  

## 2016-08-02 NOTE — Op Note (Addendum)
[facility] Patient Name: Joel Reed Procedure Date: 08/02/2016 7:55 AM MRN: [id number] Endoscopist: Jerene Bears , MD Age: 66 Referring MD:  Date of Birth: 25-Feb-1950 Gender: Male Account #: [id number] Procedure:                Colonoscopy Indications:              Screening for colorectal malignant neoplasm, Last                            colonoscopy greater than 10 years ago Medicines:                Monitored Anesthesia Care Procedure:                Pre-Anesthesia Assessment:                           - Prior to the procedure, a History and Physical                            was performed, and patient medications and                            allergies were reviewed. The patient's tolerance of                            previous anesthesia was also reviewed. The risks                            and benefits of the procedure and the sedation                            options and risks were discussed with the patient.                            All questions were answered, and informed consent                            was obtained. Prior Anticoagulants: The patient has                            taken no previous anticoagulant or antiplatelet                            agents. ASA Grade Assessment: II - A patient with                            mild systemic disease. After reviewing the risks                            and benefits, the patient was deemed in                            satisfactory condition to undergo the procedure.  After obtaining informed consent, the colonoscope                            was passed under direct vision. Throughout the                            procedure, the patient's blood pressure, pulse, and                            oxygen saturations were monitored continuously. The                            Colonoscope was introduced through the anus and                            advanced to the the cecum,  identified by                            appendiceal orifice and ileocecal valve. The                            colonoscopy was performed without difficulty. The                            patient tolerated the procedure well. The quality                            of the bowel preparation was fair clearing to good                            after copious irrigation and lavage. The ileocecal                            valve, appendiceal orifice, and rectum were                            photographed. Scope In: 8:10:21 AM Scope Out: 8:27:29 AM Scope Withdrawal Time: 0 hours 14 minutes 31 seconds  Total Procedure Duration: 0 hours 17 minutes 8 seconds  Findings:                 The digital rectal exam was normal.                           A 6 mm polyp was found in the ascending colon. The                            polyp was sessile. The polyp was removed with a                            cold snare. Resection and retrieval were complete.                           A 7 mm polyp was found in the transverse  colon. The                            polyp was sessile. The polyp was removed with a                            cold snare. Resection and retrieval were complete.                           Two sessile polyps were found in the rectum and                            recto-sigmoid colon. The polyps were 3 to 4 mm in                            size. These polyps were removed with a cold snare.                            Resection and retrieval were complete.                           Internal hemorrhoids were found during                            retroflexion. The hemorrhoids were small. Complications:            No immediate complications. Estimated Blood Loss:     Estimated blood loss was minimal. Impression:               - Preparation of the colon was fair.                           - One 6 mm polyp in the ascending colon, removed                            with a cold snare. Resected  and retrieved.                           - One 7 mm polyp in the transverse colon, removed                            with a cold snare. Resected and retrieved.                           - Two 3 to 4 mm polyps in the rectum and at the                            recto-sigmoid colon, removed with a cold snare.                            Resected and retrieved.                           - Small internal hemorrhoids. Recommendation:           -  Patient has a contact number available for                            emergencies. The signs and symptoms of potential                            delayed complications were discussed with the                            patient. Return to normal activities tomorrow.                            Written discharge instructions were provided to the                            patient.                           - Resume previous diet.                           - Continue present medications.                           - Await pathology results.                           - Repeat colonoscopy is recommended. The                            colonoscopy date will be determined after pathology                            results from today's exam become available for                            review. Jerene Bears, MD 08/02/2016 8:31:42 AM This report has been signed electronically.

## 2016-08-02 NOTE — Patient Instructions (Addendum)
YOU HAD AN ENDOSCOPIC PROCEDURE TODAY AT THE [facility]:   Refer to the procedure report that was given to you for any specific questions about what was found during the examination.  If the procedure report does not answer your questions, please call your gastroenterologist to clarify.  If you requested that your care partner not be given the details of your procedure findings, then the procedure report has been included in a sealed envelope for you to review at your convenience later.  YOU SHOULD EXPECT: Some feelings of bloating in the abdomen. Passage of more gas than usual.  Walking can help get rid of the air that was put into your GI tract during the procedure and reduce the bloating. If you had a lower endoscopy (such as a colonoscopy or flexible sigmoidoscopy) you may notice spotting of blood in your stool or on the toilet paper. If you underwent a bowel prep for your procedure, you may not have a normal bowel movement for a few days.  Please Note:  You might notice some irritation and congestion in your nose or some drainage.  This is from the oxygen used during your procedure.  There is no need for concern and it should clear up in a day or so.  SYMPTOMS TO REPORT IMMEDIATELY:   Following lower endoscopy (colonoscopy or flexible sigmoidoscopy):  Excessive amounts of blood in the stool  Significant tenderness or worsening of abdominal pains  Swelling of the abdomen that is new, acute  Fever of 100F or higher  For urgent or emergent issues, a gastroenterologist can be reached at any hour by calling [phone].   DIET:  We do recommend a small meal at first, but then you may proceed to your regular diet.  Drink plenty of fluids but you should avoid alcoholic beverages for 24 hours.  MEDICATIONS: Resume present medications.  Please see handouts given to you by your recovery nurse.  ACTIVITY:  You should plan to take it easy for the rest of today and you should NOT  DRIVE or use heavy machinery until tomorrow (because of the sedation medicines used during the test).    FOLLOW UP: Our staff will call the number listed on your records the next business day following your procedure to check on you and address any questions or concerns that you may have regarding the information given to you following your procedure. If we do not reach you, we will leave a message.  However, if you are feeling well and you are not experiencing any problems, there is no need to return our call.  We will assume that you have returned to your regular daily activities without incident.  If any biopsies were taken you will be contacted by phone or by letter within the next 1-3 weeks.  Please call us at [phone] if you have not heard about the biopsies in 3 weeks.   Thank you for allowing us to provide for your healthcare needs today.  SIGNATURES/CONFIDENTIALITY: You and/or your care partner have signed paperwork which will be entered into your electronic medical record.  These signatures attest to the fact that that the information above on your After Visit Summary has been reviewed and is understood.  Full responsibility of the confidentiality of this discharge information lies with you and/or your care-partner. 

## 2016-08-03 ENCOUNTER — Telehealth: Payer: Self-pay | Admitting: *Deleted

## 2016-08-03 NOTE — Telephone Encounter (Signed)
  Follow up Call-  Call back number 08/02/2016  Post procedure Call Back phone  # [phone]  Permission to leave phone message Yes  Some recent data might be hidden     Patient questions:  Do you have a fever, pain , or abdominal swelling? No. Pain Score  0 *  Have you tolerated food without any problems? Yes.    Have you been able to return to your normal activities? Yes.    Do you have any questions about your discharge instructions: Diet   No. Medications  No. Follow up visit  No.  Do you have questions or concerns about your Care? No.  Actions: * If pain score is 4 or above: No action needed, pain <4.

## 2016-08-09 ENCOUNTER — Encounter: Payer: Self-pay | Admitting: Internal Medicine

## 2016-08-12 ENCOUNTER — Other Ambulatory Visit: Payer: Self-pay | Admitting: Family Medicine

## 2016-08-24 ENCOUNTER — Telehealth: Payer: Self-pay | Admitting: *Deleted

## 2016-08-24 NOTE — Telephone Encounter (Signed)
Received call from patient.   States that he had meeting at work last night and ate seafood. Reports that before he got home, he had episode of loose stools. States that he has nausea, vomiting, abd cramping, RQ pain, and multiple loose stools.   Advised to stay hydrated and eat brat diet as tolerable. If Sx persist >48 hours, schedule appointment with MD or go to UC for eval/ fluids. Verbalized understanding.

## 2016-08-28 ENCOUNTER — Ambulatory Visit (INDEPENDENT_AMBULATORY_CARE_PROVIDER_SITE_OTHER): Payer: BLUE CROSS/BLUE SHIELD | Admitting: Family Medicine

## 2016-08-28 ENCOUNTER — Encounter: Payer: Self-pay | Admitting: Family Medicine

## 2016-08-28 ENCOUNTER — Ambulatory Visit (HOSPITAL_COMMUNITY)
Admission: RE | Admit: 2016-08-28 | Discharge: 2016-08-28 | Disposition: A | Discharge status: Home / Self Care | Payer: BLUE CROSS/BLUE SHIELD | Source: Ambulatory Visit | Attending: Family Medicine | Admitting: Family Medicine

## 2016-08-28 ENCOUNTER — Other Ambulatory Visit: Payer: Self-pay | Admitting: Family Medicine

## 2016-08-28 VITALS — BP 150/82 | HR 84 | Temp 98.2°F | Resp 18 | Ht 72.0 in | Wt 270.0 lb

## 2016-08-28 DIAGNOSIS — N281 Cyst of kidney, acquired: Secondary | ICD-10-CM | POA: Diagnosis not present

## 2016-08-28 DIAGNOSIS — R1011 Right upper quadrant pain: Secondary | ICD-10-CM

## 2016-08-28 LAB — CBC WITH DIFFERENTIAL/PLATELET
Basophils Absolute: 0 cells/uL (ref 0–200)
Basophils Relative: 0 %
Eosinophils Absolute: 54 cells/uL (ref 15–500)
Eosinophils Relative: 1 %
HCT: 40.9 % (ref 38.5–50.0)
Hemoglobin: 13.9 g/dL (ref 13.0–17.0)
Lymphocytes Relative: 23 %
Lymphs Abs: 1242 cells/uL (ref 850–3900)
MCH: 30.5 pg (ref 27.0–33.0)
MCHC: 34 g/dL (ref 32.0–36.0)
MCV: 89.7 fL (ref 80.0–100.0)
MPV: 10.8 fL (ref 7.5–12.5)
Monocytes Absolute: 648 cells/uL (ref 200–950)
Monocytes Relative: 12 %
Neutro Abs: 3456 cells/uL (ref 1500–7800)
Neutrophils Relative %: 64 %
Platelets: 123 10*3/uL — ABNORMAL LOW (ref 140–400)
RBC: 4.56 MIL/uL (ref 4.20–5.80)
RDW: 13.8 % (ref 11.0–15.0)
WBC: 5.4 10*3/uL (ref 3.8–10.8)

## 2016-08-28 LAB — COMPLETE METABOLIC PANEL WITH GFR
ALT: 56 U/L — ABNORMAL HIGH (ref 9–46)
AST: 37 U/L — ABNORMAL HIGH (ref 10–35)
Albumin: 4 g/dL (ref 3.6–5.1)
Alkaline Phosphatase: 83 U/L (ref 40–115)
BUN: 10 mg/dL (ref 7–25)
CO2: 24 mmol/L (ref 20–31)
Calcium: 8.9 mg/dL (ref 8.6–10.3)
Chloride: 101 mmol/L (ref 98–110)
Creat: 0.84 mg/dL (ref 0.70–1.25)
GFR, Est African American: >89 mL/min (ref >=60)
GFR, Est Non African American: >89 mL/min (ref >=60)
Glucose, Bld: 145 mg/dL — ABNORMAL HIGH (ref 70–99)
Potassium: 4.3 mmol/L (ref 3.5–5.3)
Sodium: 137 mmol/L (ref 135–146)
Total Bilirubin: 1 mg/dL (ref 0.2–1.2)
Total Protein: 7.1 g/dL (ref 6.1–8.1)

## 2016-08-28 MED ORDER — TRAMADOL HCL 50 MG PO TABS: 50.0000 mg | ORAL_TABLET | Freq: Three times a day (TID) | ORAL | 0 refills | Status: DC | PRN

## 2016-08-28 NOTE — Progress Notes (Signed)
Subjective:    Patient ID: Joel Reed, male    DOB: 28-Nov-1950, 66 y.o.   MRN: [id number]  HPI Thursday night, the patient had shrimp at a business meeting. Shortly thereafter he developed watery diarrhea. Watery diarrhea persisted for 24-48 hours and then subsided. He denies any nausea or vomiting. However over the entire weekend, he has had near constant pain in his right upper quadrant. He is tender to palpation on the lower edge of the liver. There is no obvious hepatomegaly. He denies any constipation. He denies any diarrhea. He denies any melena or hematochezia. He denies any vomiting. He denies any pain with food. He denies any fevers or chills. Pain is made worse when he is sitting still or with movement. He also reports pain with deep inspiration. He denies any cough or shortness of breath. Past Medical History:  Diagnosis Date  . Diabetes mellitus without complication ([place])   . Hyperlipidemia   . Hypertension   . Nephrolithiasis    Past Surgical History:  Procedure Laterality Date  . JOINT REPLACEMENT     left hip Dr Noemi Chapel  . SPINE SURGERY     disectomy   Current Outpatient Prescriptions on File Prior to Visit  Medication Sig Dispense Refill  . amLODipine (NORVASC) 5 MG tablet TAKE 1 TABLET BY MOUTH ONCE DAILY 90 tablet 2  . aspirin EC 81 MG tablet Take 81 mg by mouth daily.    Marland Kitchen glimepiride (AMARYL) 4 MG tablet Take 1 tablet (4 mg total) by mouth daily. 90 tablet 1  . losartan (COZAAR) 100 MG tablet Take 100 mg by mouth daily.     . metFORMIN (GLUCOPHAGE) 500 MG tablet TAKE ONE TABLET BY MOUTH TWICE DAILY WITH  A  MEAL 60 tablet 3  . metoprolol succinate (TOPROL-XL) 25 MG 24 hr tablet Take 1 tablet (25 mg total) by mouth daily. 90 tablet 0  . pravastatin (PRAVACHOL) 20 MG tablet Take 1 tablet (20 mg total) by mouth daily. 90 tablet 0   Current Facility-Administered Medications on File Prior to Visit  Medication Dose Route Frequency Provider Last Rate Last Dose  . 0.9  %  sodium chloride infusion  500 mL Intravenous Continuous Pyrtle, Lajuan Lines, MD       No Known Allergies Social History   Social History  . Marital status: Single    Spouse name: N/A  . Number of children: N/A  . Years of education: N/A   Occupational History  . Not on file.   Social History Main Topics  . Smoking status: Never Smoker  . Smokeless tobacco: Never Used  . Alcohol use No  . Drug use: No  . Sexual activity: Not on file   Other Topics Concern  . Not on file   Social History Narrative  . No narrative on file      Review of Systems  All other systems reviewed and are negative.      Objective:   Physical Exam  Constitutional: He appears well-developed and well-nourished. No distress.  Cardiovascular: Normal rate, regular rhythm and normal heart sounds.   Pulmonary/Chest: Effort normal and breath sounds normal. No respiratory distress. He has no wheezes. He has no rales.  Abdominal: Soft. Bowel sounds are normal. He exhibits no distension and no mass. There is tenderness. There is no rebound and no guarding.  Skin: He is not diaphoretic.  Vitals reviewed.         Assessment & Plan:  RUQ pain -  Plan: CBC with Differential/Platelet, COMPLETE METABOLIC PANEL WITH GFR, Hepatitis panel, acute, US Abdomen Limited RUQ  Check CBC to evaluate for leukocytosis, CMP to check liver function test. I will check a acute hepatitis panel to rule out hepatitis A. I will also obtain a right upper quadrant ultrasound given the pain in the area to rule out cholelithiasis, cholecystitis. My suspicion is that he has food poisoning and with pain in the right upper quadrant is musculoskeletal in nature. Therefore if right upper quadrant ultrasound is normal, lab work is normal, I will treat the patient with medication to ease the pain. Await the results of the right upper quadrant ultrasound today

## 2016-08-29 ENCOUNTER — Other Ambulatory Visit: Payer: BLUE CROSS/BLUE SHIELD

## 2016-08-29 ENCOUNTER — Ambulatory Visit (HOSPITAL_COMMUNITY)
Admission: RE | Admit: 2016-08-29 | Discharge: 2016-08-29 | Disposition: A | Discharge status: Home / Self Care | Payer: BLUE CROSS/BLUE SHIELD | Source: Ambulatory Visit | Attending: Family Medicine | Admitting: Family Medicine

## 2016-08-29 ENCOUNTER — Other Ambulatory Visit: Payer: Self-pay | Admitting: Family Medicine

## 2016-08-29 DIAGNOSIS — J9811 Atelectasis: Secondary | ICD-10-CM | POA: Insufficient documentation

## 2016-08-29 DIAGNOSIS — R091 Pleurisy: Secondary | ICD-10-CM

## 2016-08-29 LAB — HEPATITIS PANEL, ACUTE
HCV Ab: NEGATIVE
Hep A IgM: NONREACTIVE
Hep B C IgM: NONREACTIVE
Hepatitis B Surface Ag: NEGATIVE

## 2016-08-29 IMAGING — DX DG CHEST 2V
2 series · 2 of 2 positions shown · non-contrast
Comparison: None.

CLINICAL DATA: Right-sided pain with shortness of Breath

EXAM:
CHEST  2 VIEW

[chest pa]
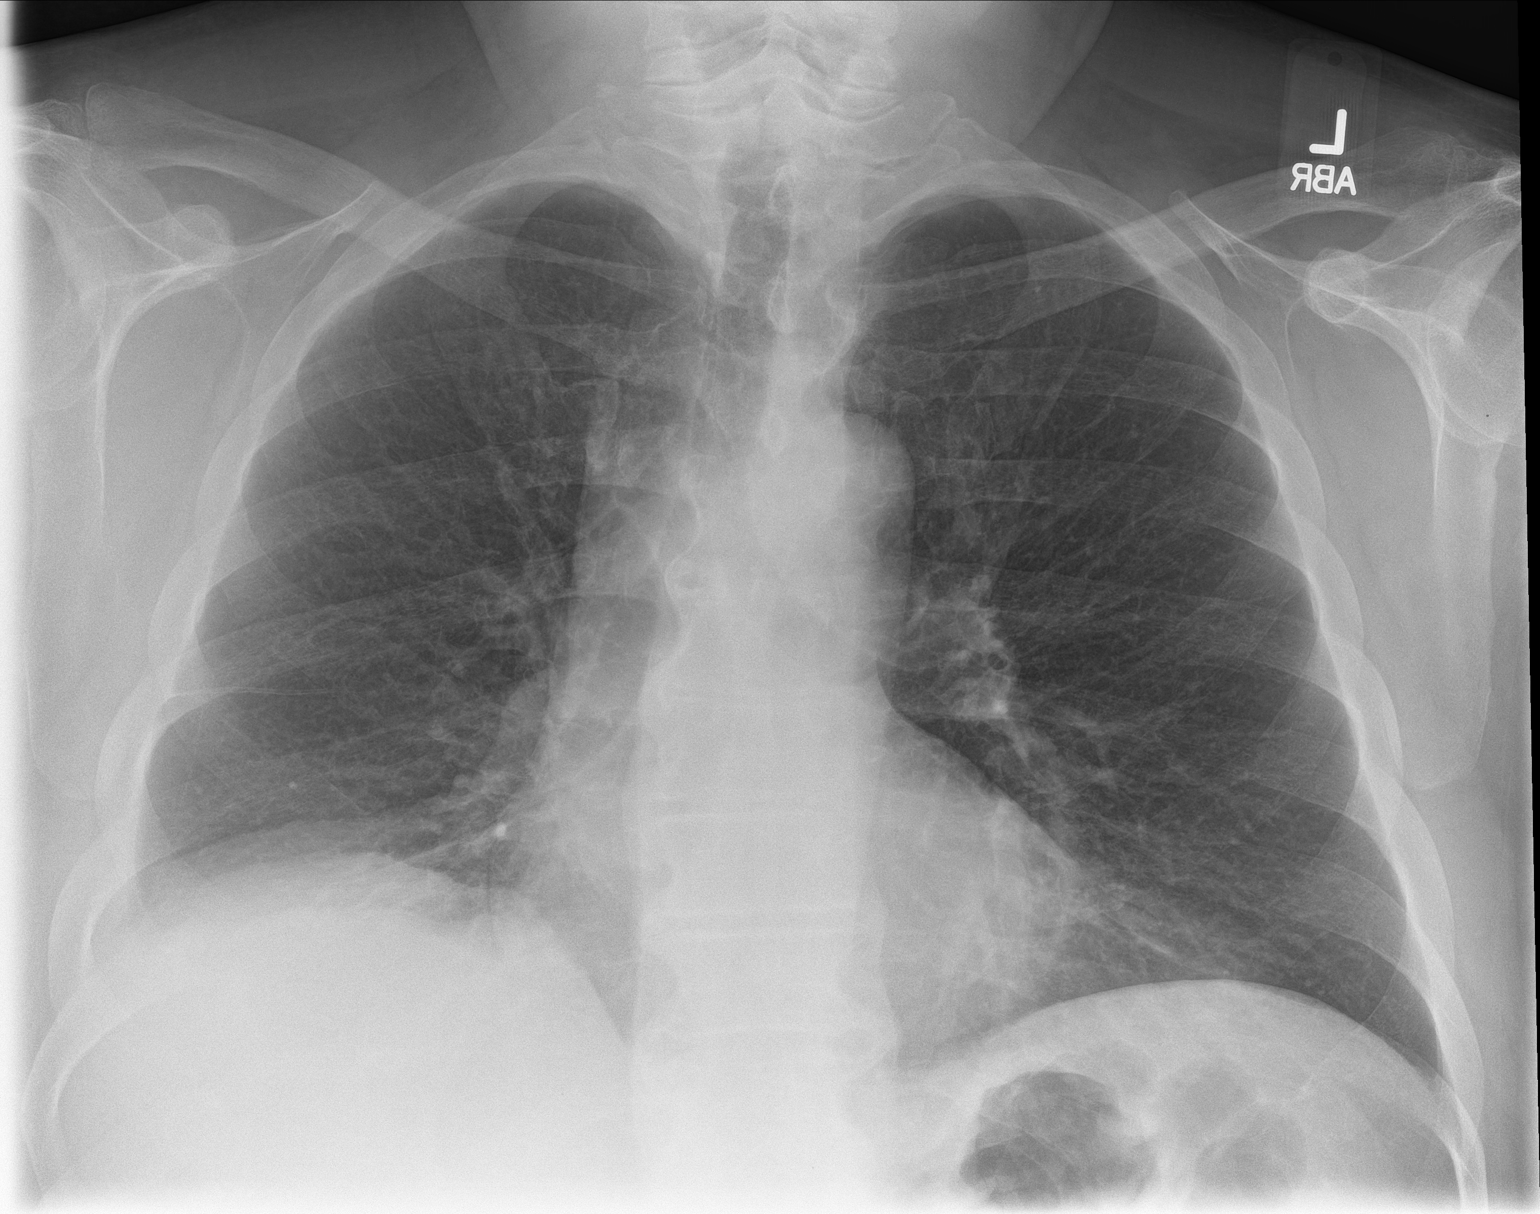

[chest lat]
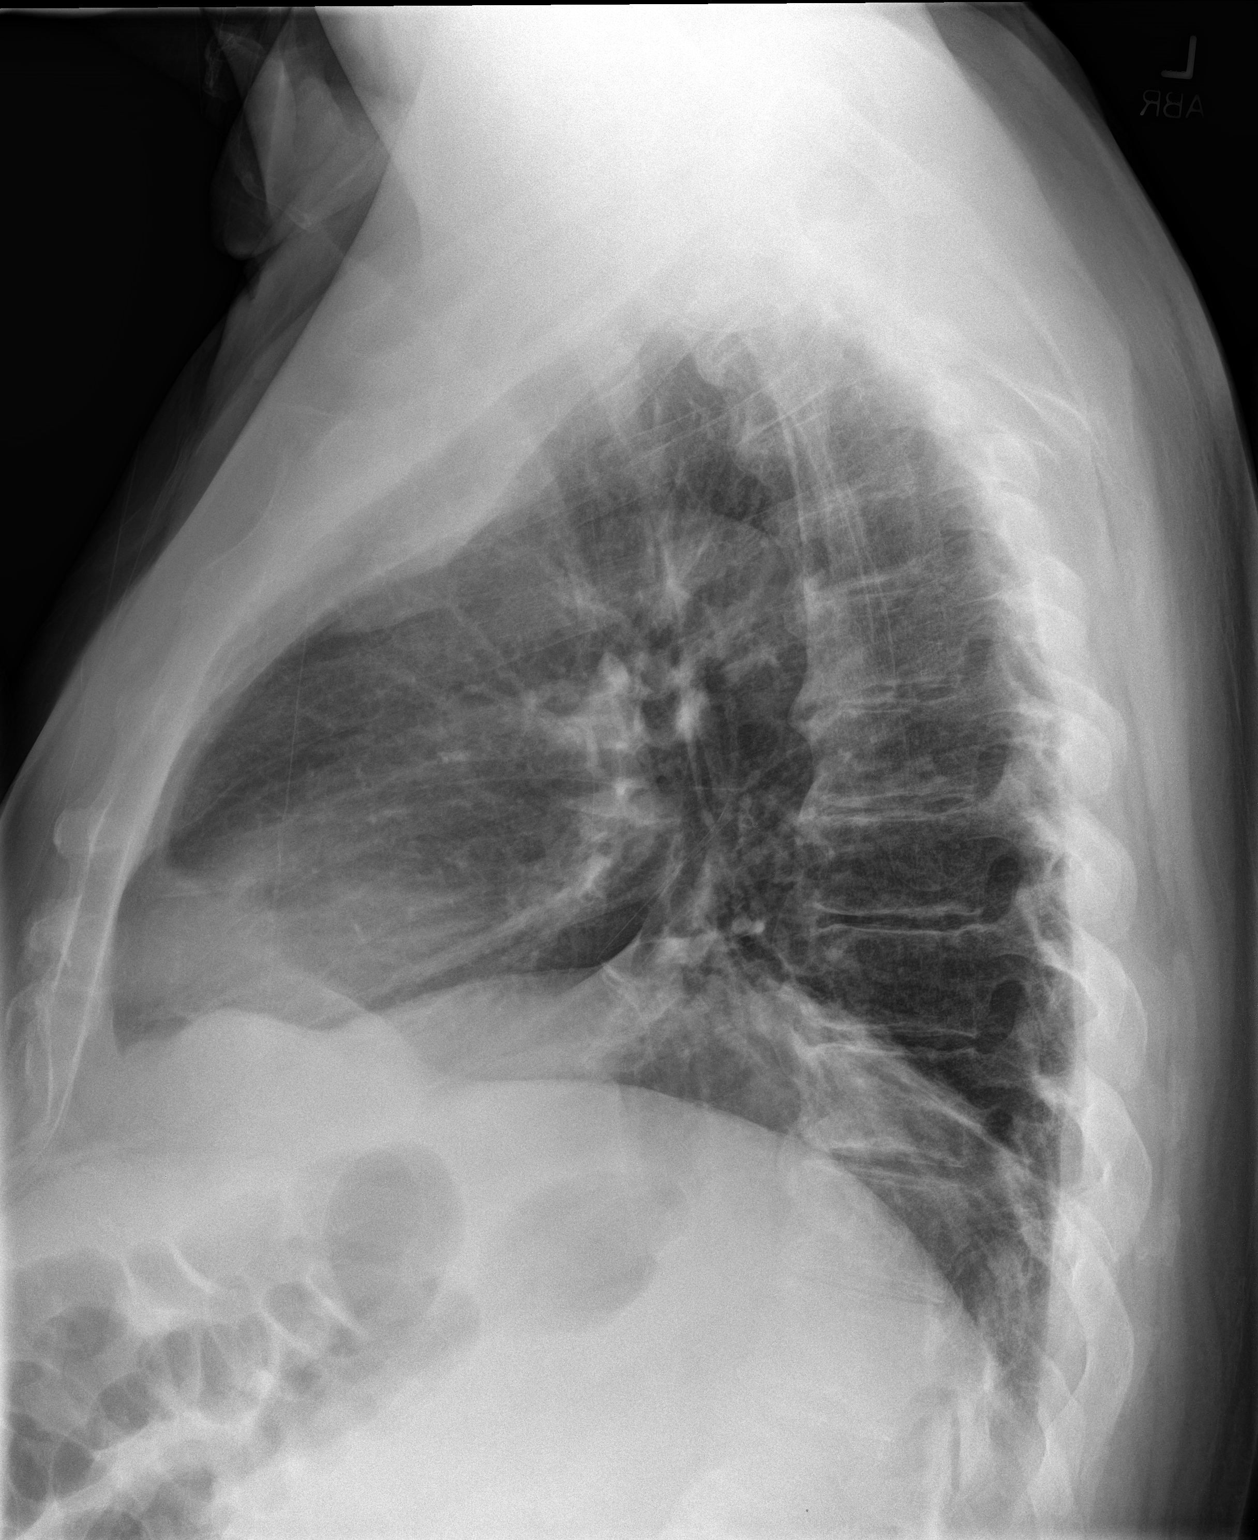

[2 of 2 positions shown; findings below may reference images not displayed]

FINDINGS: Cardiac shadow is within normal limits. The lungs are well aerated
bilaterally. Mild right atelectasis is seen. No sizable effusion is
noted. Degenerative changes of the thoracic spine are noted.
IMPRESSION: Right basilar atelectasis.

## 2016-08-30 ENCOUNTER — Ambulatory Visit: Payer: BLUE CROSS/BLUE SHIELD | Admitting: Family Medicine

## 2016-08-30 ENCOUNTER — Other Ambulatory Visit: Payer: Self-pay | Admitting: Family Medicine

## 2016-08-30 DIAGNOSIS — R1011 Right upper quadrant pain: Secondary | ICD-10-CM

## 2016-08-31 ENCOUNTER — Encounter: Payer: Self-pay | Admitting: Family Medicine

## 2016-08-31 ENCOUNTER — Other Ambulatory Visit: Payer: Self-pay | Admitting: Family Medicine

## 2016-08-31 DIAGNOSIS — R1011 Right upper quadrant pain: Secondary | ICD-10-CM

## 2016-09-04 ENCOUNTER — Other Ambulatory Visit: Payer: Self-pay | Admitting: Family Medicine

## 2016-09-04 ENCOUNTER — Ambulatory Visit
Admission: RE | Admit: 2016-09-04 | Discharge: 2016-09-04 | Disposition: A | Discharge status: Home / Self Care | Payer: BLUE CROSS/BLUE SHIELD | Source: Ambulatory Visit | Attending: Family Medicine | Admitting: Family Medicine

## 2016-09-04 DIAGNOSIS — R1011 Right upper quadrant pain: Secondary | ICD-10-CM

## 2016-09-04 IMAGING — CT CT ABD-PELV W/ CM
3 of 5 series · 12 of 36 positions shown, 18 images · IV contrast (READICAT/WATER & [ID] ISOVUE 300)
Comparison: [DATE]

CLINICAL DATA: Right upper quadrant pain for 1.5 weeks. Remote left
hip replacement. History of nephrolithiasis.

EXAM:
CT ABDOMEN AND PELVIS WITH CONTRAST
TECHNIQUE: Multidetector CT imaging of the abdomen and pelvis was performed
using the standard protocol following bolus administration of
intravenous contrast.
CONTRAST:  125mL [CP] IOPAMIDOL ([CP]) INJECTION 61%

[Series 3: abd/pelvis with · axial · 0.98mm/px · z∈[-340,+25]mm · 7 of 99 slices shown, 12 images]
[im 13/99  soft-tissue]
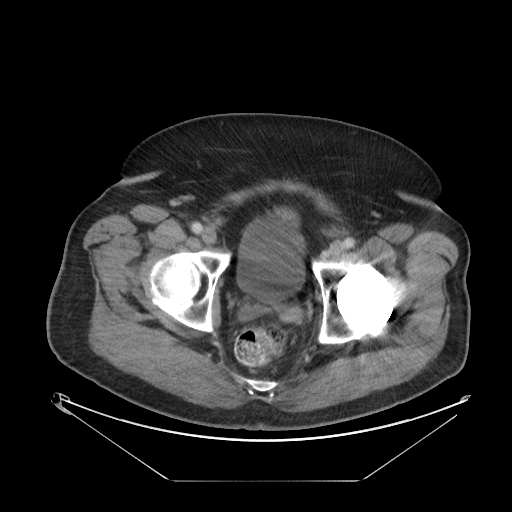
[im 13/99  bone]
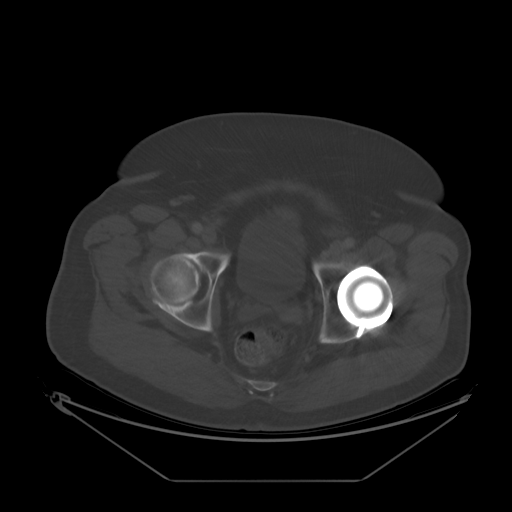
[im 25/99  soft-tissue]
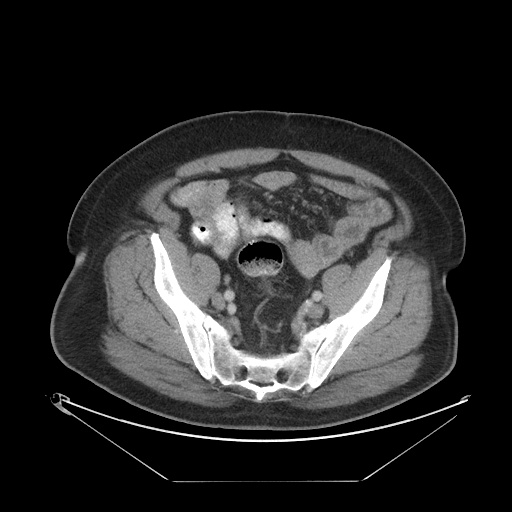
[im 37/99  soft-tissue]
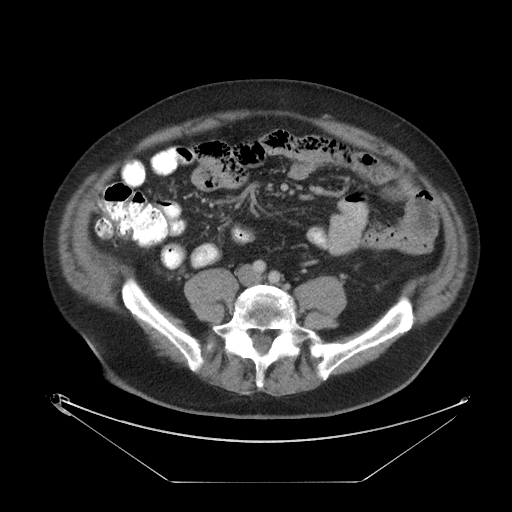
[im 50/99  soft-tissue]
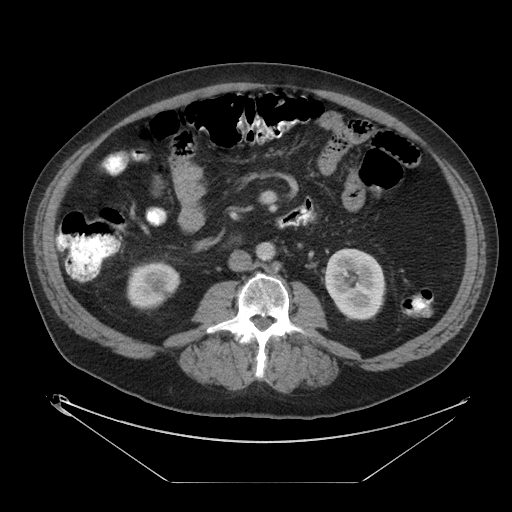
[im 50/99  lung]
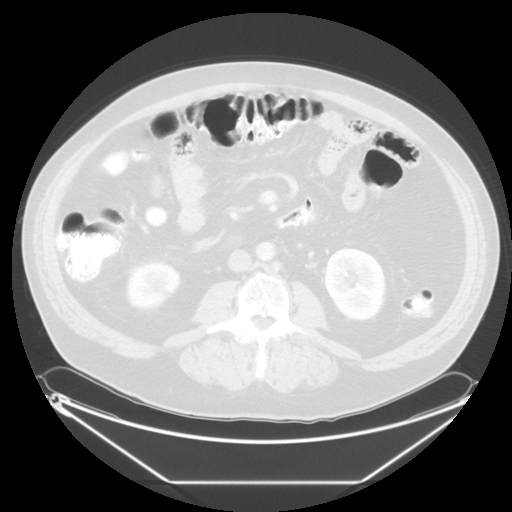
[im 62/99  soft-tissue]
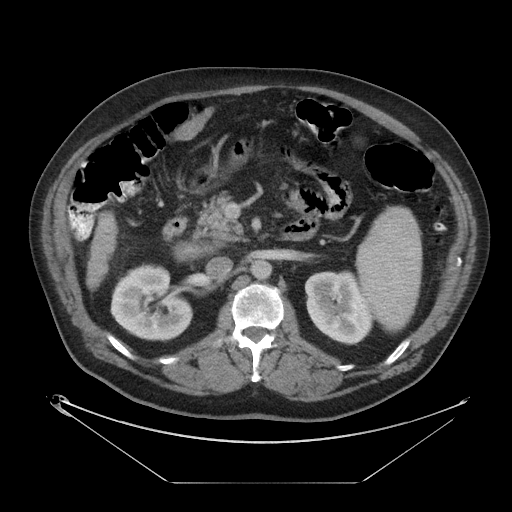
[im 62/99  lung]
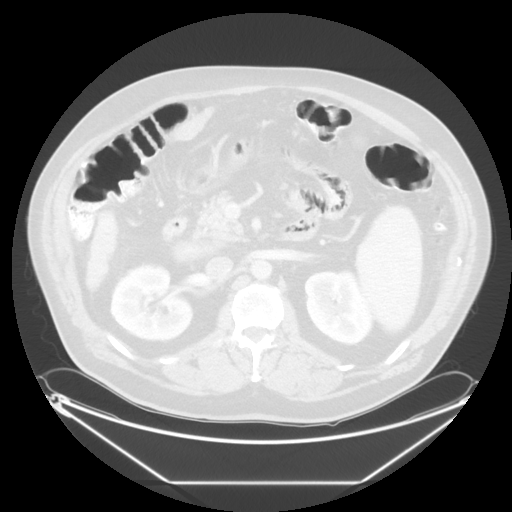
[im 74/99  soft-tissue]
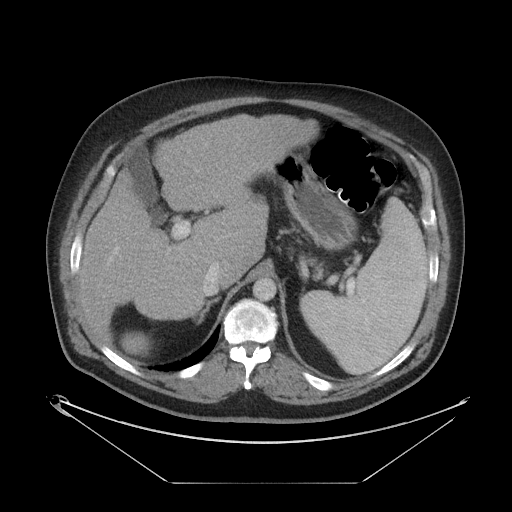
[im 74/99  lung]
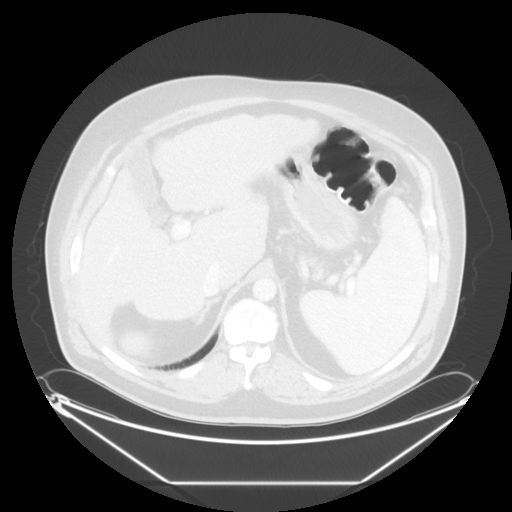
[im 86/99  soft-tissue]
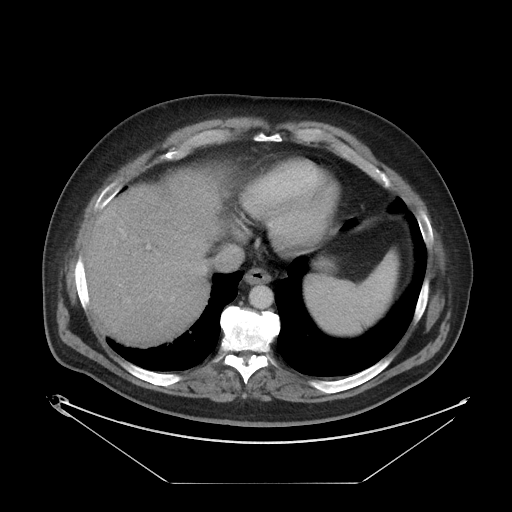
[im 86/99  lung]
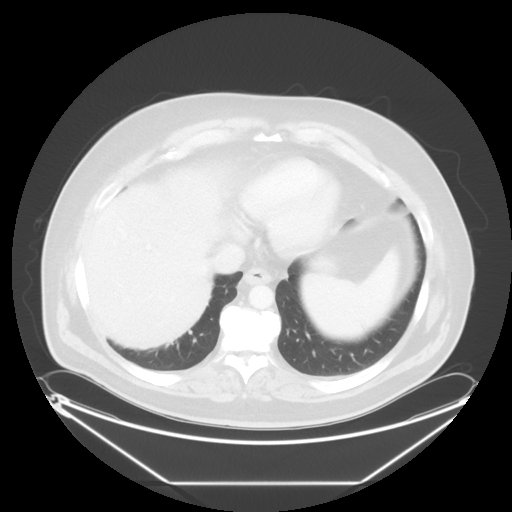

[Series 601: coronal body · coronal · 0.98mm/px · 1 of 149 slices shown, 2 images]
[im 50/149  soft-tissue]
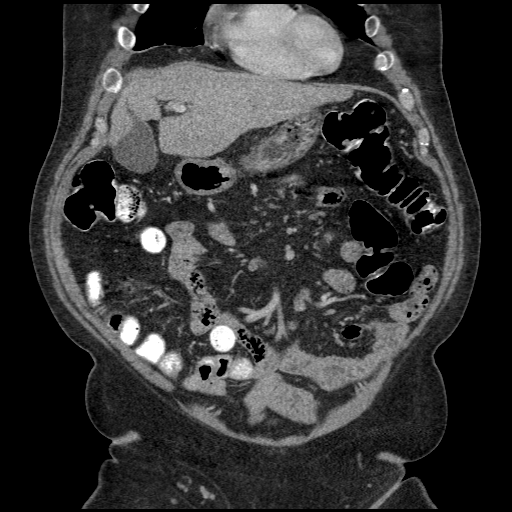
[im 50/149  bone]
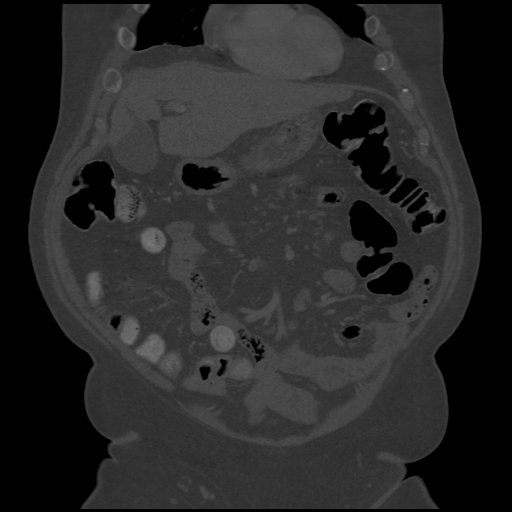

[Series 602: sagittal body · sagittal · 0.98mm/px · 4 of 188 slices shown]
[im 12/188  soft-tissue]
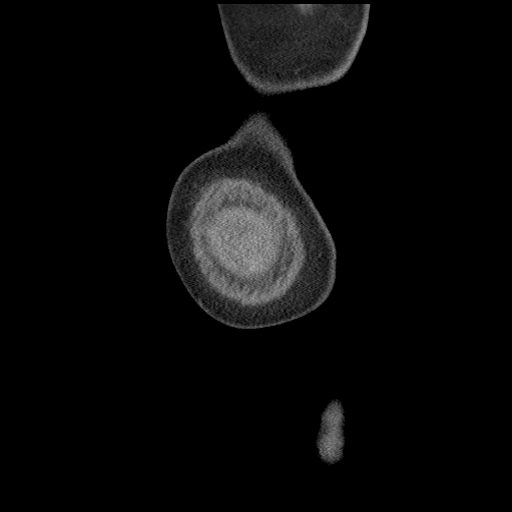
[im 36/188  soft-tissue]
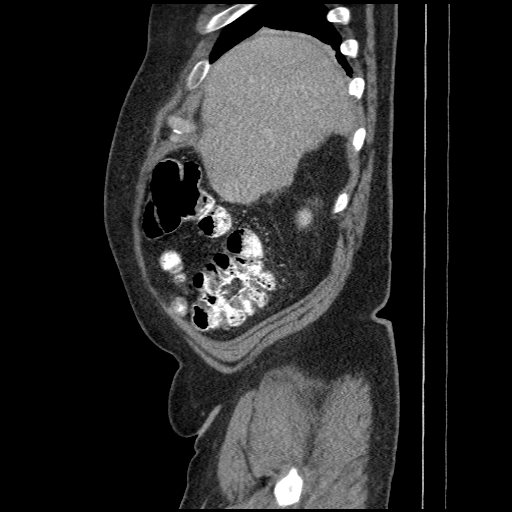
[im 59/188  soft-tissue]
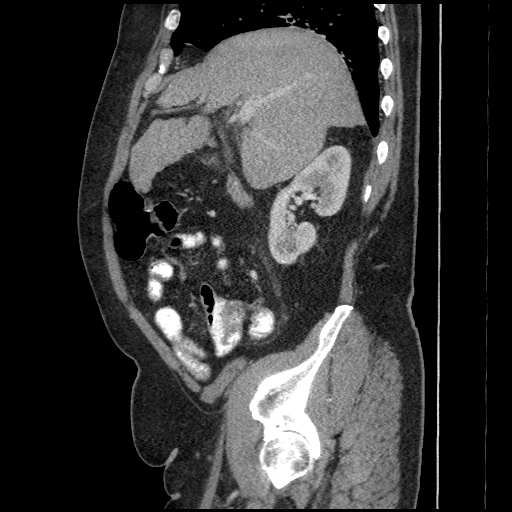
[im 82/188  soft-tissue]
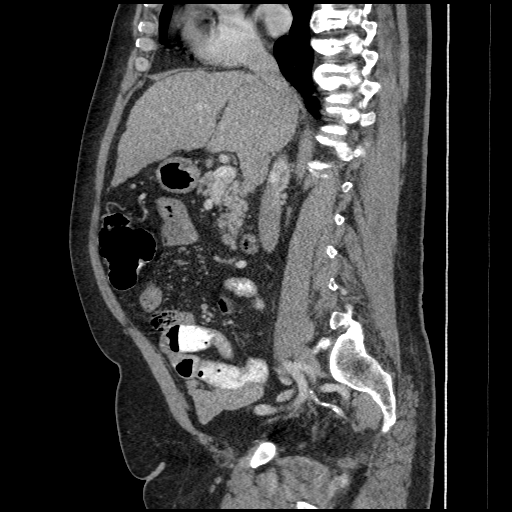

[12 of 36 positions shown; findings below may reference images not displayed]

FINDINGS: Lower chest: Minor left basilar atelectasis. Symmetric gynecomastia
noted anteriorly. Normal heart size. No pericardial or pleural
effusion. Degenerative changes of the lower thoracic spine.

Hepatobiliary: Irregularity and micronodular pattern to the liver
surface compatible with hepatic cirrhosis. Gallbladder and biliary
system unremarkable. No biliary dilatation. Portal and hepatic veins
remain patent. No focal hepatic abnormality.

Pancreas: Unremarkable. No pancreatic ductal dilatation or
surrounding inflammatory changes.

Spleen: Spleen is enlarged measuring 16 cm in length, previously
14.5 cm. No focal splenic abnormality.

Adrenals/Urinary Tract: Normal adrenal glands. Incidental hypodense
12 mm cyst in the right kidney lower pole. Punctate nonobstructing
nephrolithiasis in the left kidney lower pole. No renal obstruction
or hydronephrosis. No obstructing ureteral calculus or hydroureter.
Bladder is underdistended.

Stomach/Bowel: Negative for bowel obstruction, significant
dilatation, ileus, or free air. No fluid collection or abscess.
Normal appendix.

Vascular/Lymphatic: Aortic atherosclerosis present without aneurysm.
No occlusive process. No adenopathy.

Reproductive: No acute or significant finding.

Other: No abdominal wall hernia or abnormality. No abdominopelvic
ascites.

Musculoskeletal: Remote left hip arthroplasty creates artifact
through the pelvis. Degenerative changes of the lumbar spine and
facet joints. No acute osseous finding.
IMPRESSION: No acute intra-abdominal or pelvic finding by CT.

Hepatic cirrhosis with mild splenomegaly. No other complicating
feature of cirrhosis.

Nonobstructing punctate left nephrolithiasis.

Aortic atherosclerosis

Gynecomastia

## 2016-09-04 MED ORDER — IOPAMIDOL (ISOVUE-300) INJECTION 61%
125.0000 mL | Freq: Once | INTRAVENOUS | Status: AC | PRN
  Administered 2016-09-04: 125 mL via INTRAVENOUS

## 2016-09-06 ENCOUNTER — Encounter: Payer: Self-pay | Admitting: Family Medicine

## 2016-09-06 ENCOUNTER — Ambulatory Visit (INDEPENDENT_AMBULATORY_CARE_PROVIDER_SITE_OTHER): Payer: BLUE CROSS/BLUE SHIELD | Admitting: Family Medicine

## 2016-09-06 VITALS — BP 140/80 | HR 64 | Temp 98.3°F | Resp 18 | Ht 72.0 in | Wt 267.0 lb

## 2016-09-06 DIAGNOSIS — K7460 Unspecified cirrhosis of liver: Secondary | ICD-10-CM | POA: Diagnosis not present

## 2016-09-06 NOTE — Progress Notes (Signed)
Subjective:    Patient ID: Joel Reed, male    DOB: May 15, 1950, 66 y.o.   MRN: [id number]  HPI  08/28/16 Thursday night, the patient had shrimp at a business meeting. Shortly thereafter he developed watery diarrhea. Watery diarrhea persisted for 24-48 hours and then subsided. He denies any nausea or vomiting. However over the entire weekend, he has had near constant pain in his right upper quadrant. He is tender to palpation on the lower edge of the liver. There is no obvious hepatomegaly. He denies any constipation. He denies any diarrhea. He denies any melena or hematochezia. He denies any vomiting. He denies any pain with food. He denies any fevers or chills. Pain is made worse when he is sitting still or with movement. He also reports pain with deep inspiration. He denies any cough or shortness of breath.  At that time, my plan was: Check CBC to evaluate for leukocytosis, CMP to check liver function test. I will check a acute hepatitis panel to rule out hepatitis A. I will also obtain a right upper quadrant ultrasound given the pain in the area to rule out cholelithiasis, cholecystitis. My suspicion is that he has food poisoning and with pain in the right upper quadrant is musculoskeletal in nature. Therefore if right upper quadrant ultrasound is normal, lab work is normal, I will treat the patient with medication to ease the pain. Await the results of the right upper quadrant ultrasound today  09/06/16 Right upper quadrant ultrasound revealed no cause of the patient's pain. CT scan revealed cirrhosis with mild splenomegaly but otherwise revealed no cause for his pain. Chest x-ray was clear. Patient states that his pain is gradually improving. It is now brought on only by movement. If he twists or turns the pain worse on the right side. He takes a deep breath in the pain will worsen. It sounds musculoskeletal/abdominal wall related. He denies any fever. He denies any nausea vomiting or diarrhea. He  is here today primarily to discuss the cirrhosis Past Medical History:  Diagnosis Date  . Diabetes mellitus without complication ([place])   . Hyperlipidemia   . Hypertension   . Nephrolithiasis    Past Surgical History:  Procedure Laterality Date  . JOINT REPLACEMENT     left hip Dr Noemi Chapel  . SPINE SURGERY     disectomy   Current Outpatient Prescriptions on File Prior to Visit  Medication Sig Dispense Refill  . amLODipine (NORVASC) 5 MG tablet TAKE 1 TABLET BY MOUTH ONCE DAILY 90 tablet 2  . aspirin EC 81 MG tablet Take 81 mg by mouth daily.    Marland Kitchen glimepiride (AMARYL) 4 MG tablet TAKE ONE TABLET BY MOUTH ONCE DAILY 90 tablet 1  . losartan (COZAAR) 100 MG tablet Take 100 mg by mouth daily.     . metFORMIN (GLUCOPHAGE) 500 MG tablet TAKE ONE TABLET BY MOUTH TWICE DAILY WITH  A  MEAL 60 tablet 3  . metoprolol succinate (TOPROL-XL) 25 MG 24 hr tablet Take 1 tablet (25 mg total) by mouth daily. 90 tablet 0  . traMADol (ULTRAM) 50 MG tablet Take 1-2 tablets (50-100 mg total) by mouth every 8 (eight) hours as needed. 30 tablet 0  . pravastatin (PRAVACHOL) 20 MG tablet Take 1 tablet (20 mg total) by mouth daily. (Patient not taking: Reported on 09/06/2016) 90 tablet 0   No current facility-administered medications on file prior to visit.    No Known Allergies Social History   Social History  .  Marital status: Single    Spouse name: N/A  . Number of children: N/A  . Years of education: N/A   Occupational History  . Not on file.   Social History Main Topics  . Smoking status: Never Smoker  . Smokeless tobacco: Never Used  . Alcohol use No  . Drug use: No  . Sexual activity: Not on file   Other Topics Concern  . Not on file   Social History Narrative  . No narrative on file      Review of Systems  All other systems reviewed and are negative.      Objective:   Physical Exam  Constitutional: He appears well-developed and well-nourished. No distress.  Cardiovascular:  Normal rate, regular rhythm and normal heart sounds.   Pulmonary/Chest: Effort normal and breath sounds normal. No respiratory distress. He has no wheezes. He has no rales.  Abdominal: Soft. Bowel sounds are normal. He exhibits no distension and no mass. There is no tenderness. There is no rebound and no guarding.  Skin: He is not diaphoretic.  Vitals reviewed.         Assessment & Plan:  Cirrhosis of liver without ascites, unspecified hepatic cirrhosis type ([place]) - Plan: Amb Referral to Hepatology I believe the pain he was having in his right side is musculoskeletal. It is slowly improving and we decided to watch it for the next 2 weeks and allow her to gradually improve. We spent 20 minutes today discussing his cirrhosis. I recommended 30-50 pounds weight loss, a 1500-calorie a day diet, a vegetarian diet, switching glipizide to victoza for weight loss.  Patient would also like to touch base again with his hepatologist at [facility]. In the past he had a liver biopsy to confirm fatty liver disease. However he never has cirrhosis until now. I will schedule this as well

## 2016-10-06 IMAGING — US US ABDOMEN LIMITED
1 series · 14 of 25 positions shown · non-contrast
Comparison: Abdomen and pelvis CT dated [DATE].

CLINICAL DATA: Right upper quadrant abdominal pain.

EXAM:
ULTRASOUND ABDOMEN LIMITED RIGHT UPPER QUADRANT

[Series 1: us abdomen limited · 0.25mm/px · 14 of 49 slices shown]
[im 1/49]
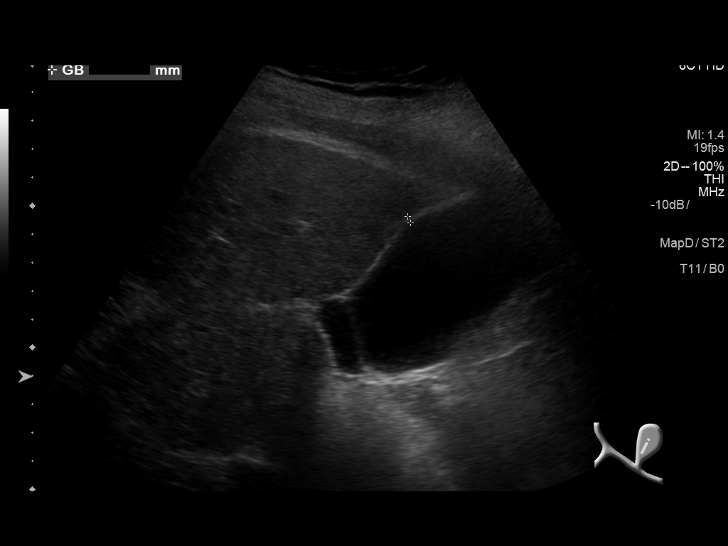
[im 5/49]
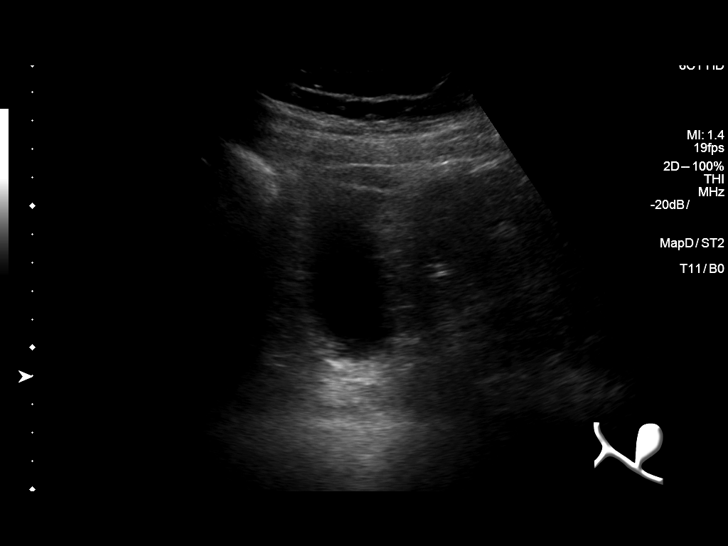
[im 9/49]
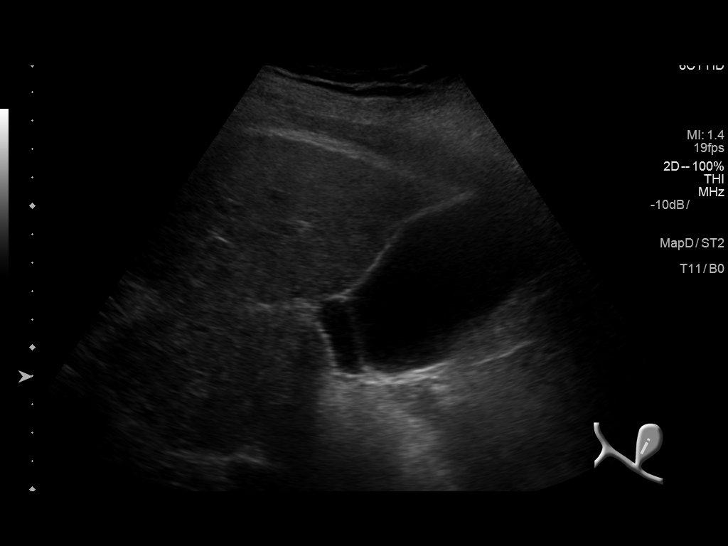
[im 13/49]
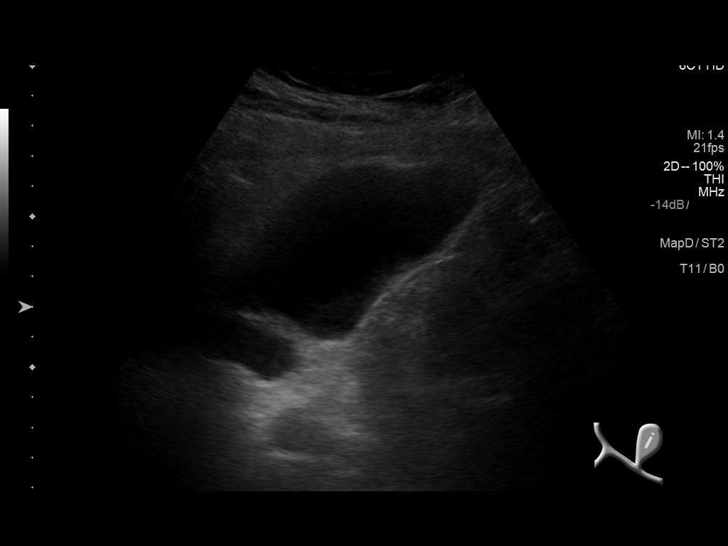
[im 17/49]
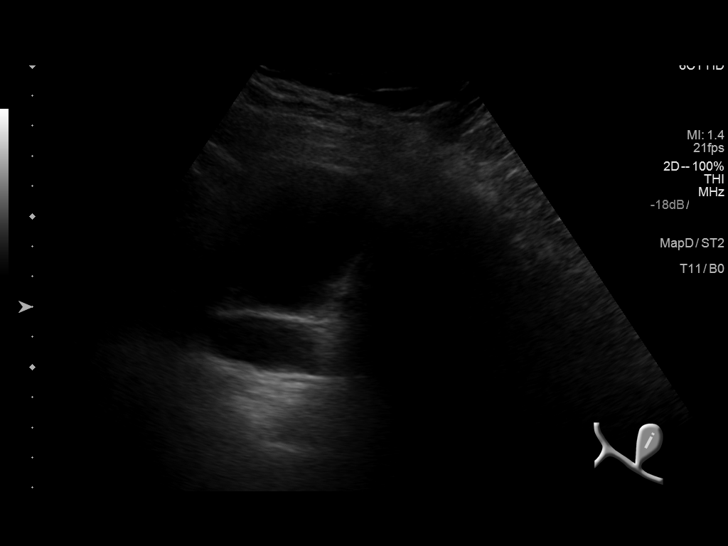
[im 19/49]
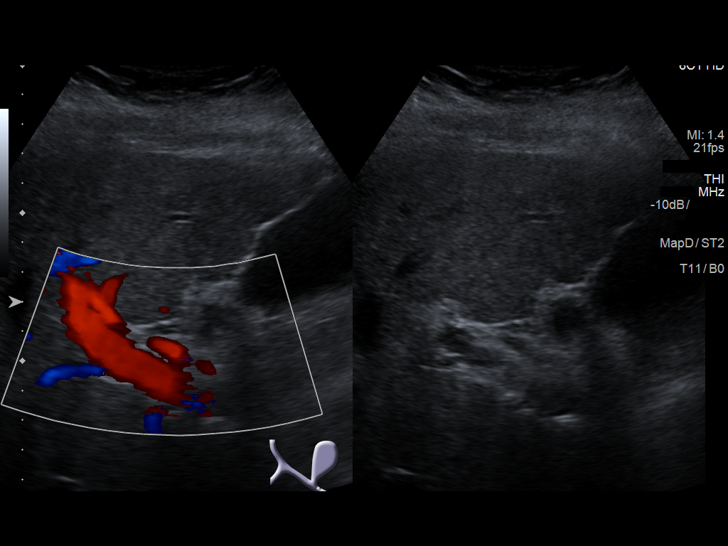
[im 23/49]
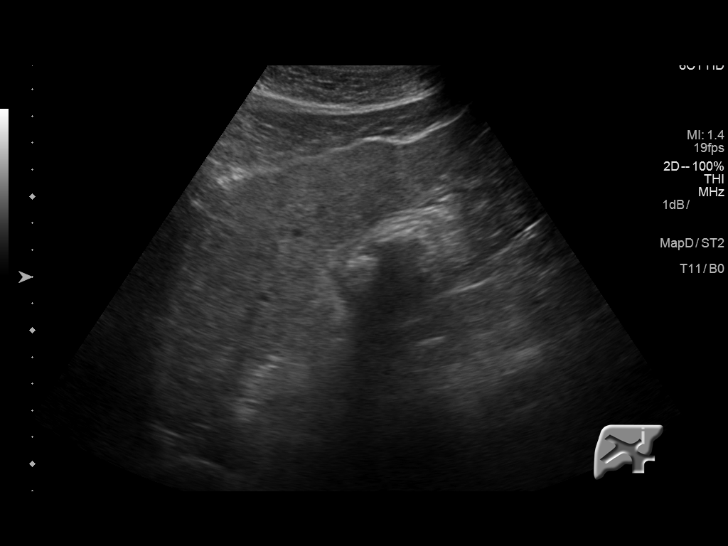
[im 27/49]
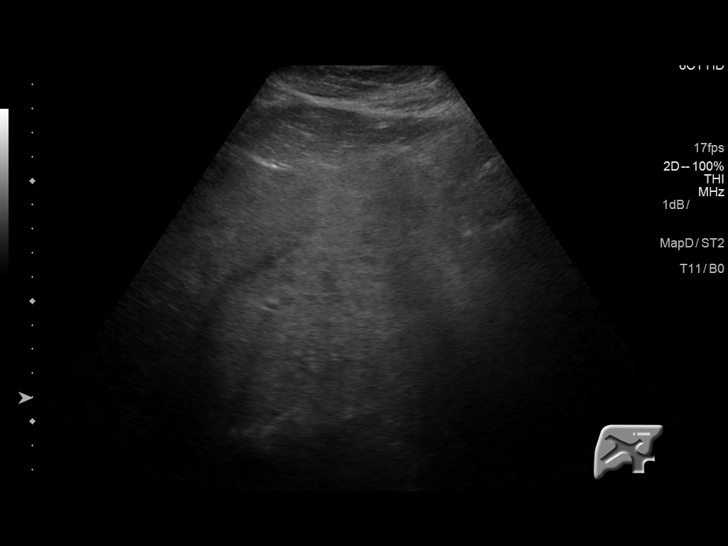
[im 31/49]
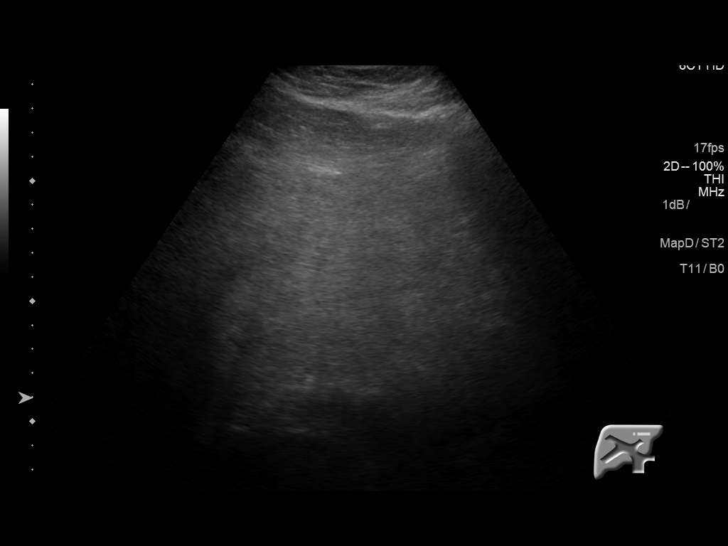
[im 33/49]
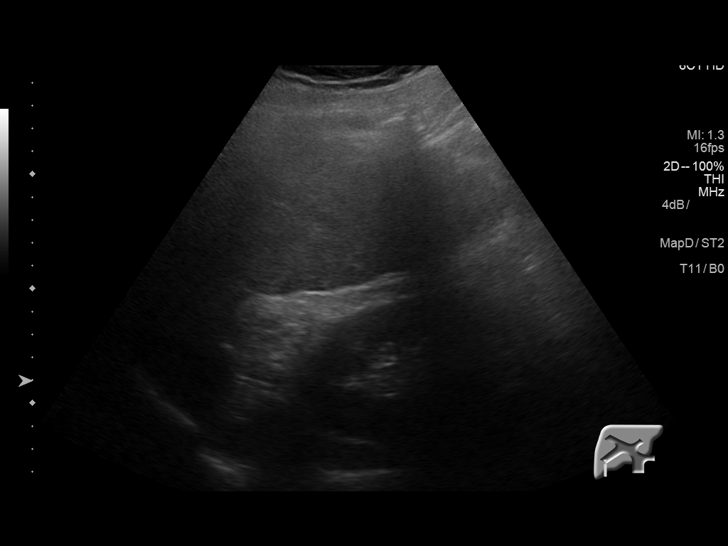
[im 37/49]
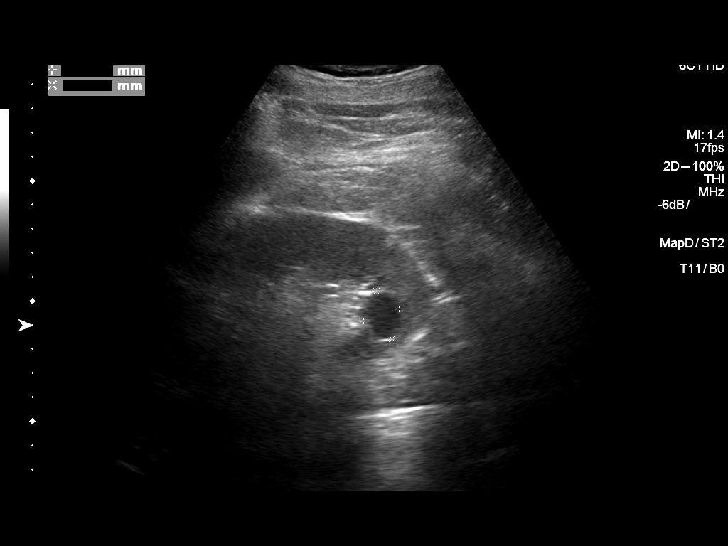
[im 41/49]
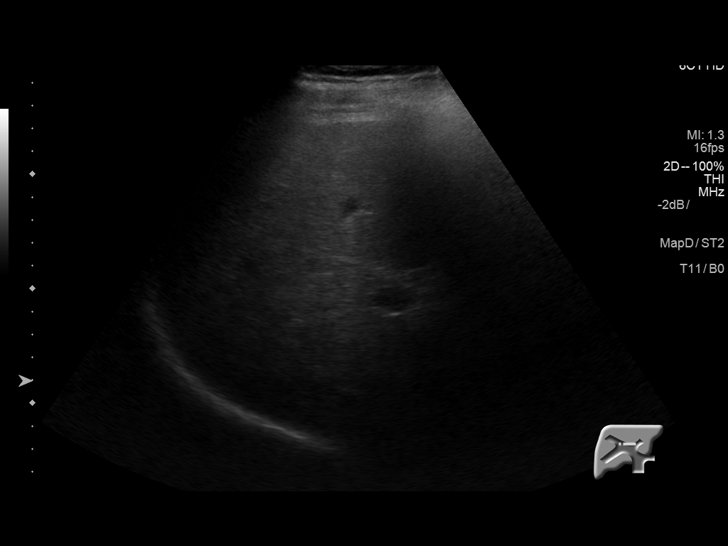
[im 45/49]
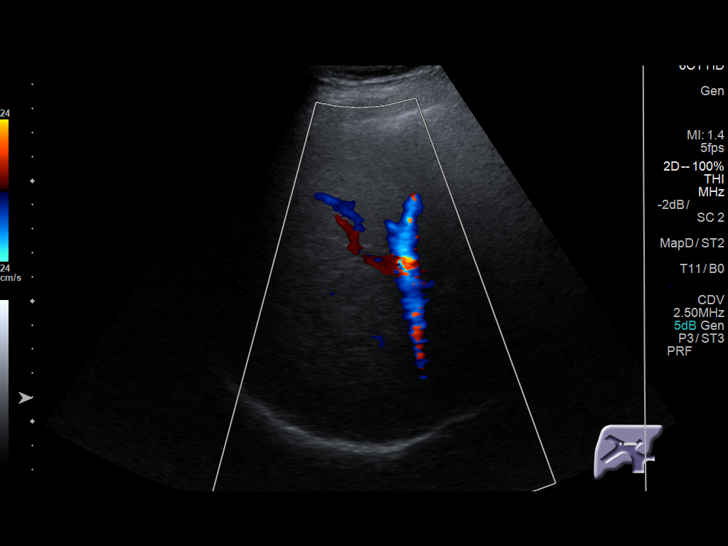
[im 49/49]
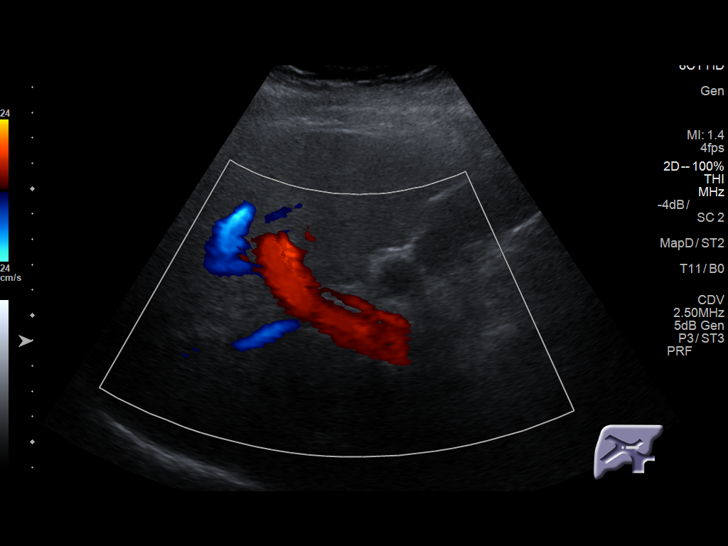

[14 of 25 positions shown; findings below may reference images not displayed]

FINDINGS: Gallbladder:

No gallstones or wall thickening visualized. No sonographic Murphy
sign noted by sonographer.

Common bile duct:

Diameter: 3.6 mm

Liver:

Diffusely echogenic.  Previously noted lobulated contours.

Other:  2.1 cm lower pole right renal cyst.
IMPRESSION: 1. Diffusely echogenic liver with lobulated contours. This could all
be due to cirrhosis. Hepatic steatosis with cirrhosis is also a
possibility.
2. 2.1 cm lower pole right renal cyst.

## 2016-10-09 ENCOUNTER — Other Ambulatory Visit: Payer: Self-pay | Admitting: Family Medicine

## 2016-10-09 MED ORDER — LOSARTAN POTASSIUM 100 MG PO TABS: 100.0000 mg | ORAL_TABLET | Freq: Every day | ORAL | 3 refills | Status: DC

## 2016-10-14 ENCOUNTER — Other Ambulatory Visit: Payer: Self-pay | Admitting: Family Medicine

## 2016-10-26 ENCOUNTER — Encounter: Payer: Self-pay | Admitting: Family Medicine

## 2016-10-26 ENCOUNTER — Ambulatory Visit (INDEPENDENT_AMBULATORY_CARE_PROVIDER_SITE_OTHER): Payer: BLUE CROSS/BLUE SHIELD | Admitting: Family Medicine

## 2016-10-26 VITALS — BP 130/80 | HR 78 | Temp 98.6°F | Resp 16 | Ht 72.0 in | Wt 268.0 lb

## 2016-10-26 DIAGNOSIS — Z9109 Other allergy status, other than to drugs and biological substances: Secondary | ICD-10-CM

## 2016-10-26 MED ORDER — CETIRIZINE HCL 10 MG PO TABS: 10.0000 mg | ORAL_TABLET | Freq: Every day | ORAL | 11 refills | Status: DC

## 2016-10-26 MED ORDER — FLUTICASONE PROPIONATE 50 MCG/ACT NA SUSP: 2.0000 | Freq: Every day | NASAL | 6 refills | Status: DC

## 2016-10-26 NOTE — Progress Notes (Signed)
Subjective:    Patient ID: Joel Reed, male    DOB: 1950-08-18, 66 y.o.   MRN: [id number]  HPI  08/28/16 Thursday night, the patient had shrimp at a business meeting. Shortly thereafter he developed watery diarrhea. Watery diarrhea persisted for 24-48 hours and then subsided. He denies any nausea or vomiting. However over the entire weekend, he has had near constant pain in his right upper quadrant. He is tender to palpation on the lower edge of the liver. There is no obvious hepatomegaly. He denies any constipation. He denies any diarrhea. He denies any melena or hematochezia. He denies any vomiting. He denies any pain with food. He denies any fevers or chills. Pain is made worse when he is sitting still or with movement. He also reports pain with deep inspiration. He denies any cough or shortness of breath.  At that time, my plan was: Check CBC to evaluate for leukocytosis, CMP to check liver function test. I will check a acute hepatitis panel to rule out hepatitis A. I will also obtain a right upper quadrant ultrasound given the pain in the area to rule out cholelithiasis, cholecystitis. My suspicion is that he has food poisoning and with pain in the right upper quadrant is musculoskeletal in nature. Therefore if right upper quadrant ultrasound is normal, lab work is normal, I will treat the patient with medication to ease the pain. Await the results of the right upper quadrant ultrasound today  09/06/16 Right upper quadrant ultrasound revealed no cause of the patient's pain. CT scan revealed cirrhosis with mild splenomegaly but otherwise revealed no cause for his pain. Chest x-ray was clear. Patient states that his pain is gradually improving. It is now brought on only by movement. If he twists or turns the pain worse on the right side. He takes a deep breath in the pain will worsen. It sounds musculoskeletal/abdominal wall related. He denies any fever. He denies any nausea vomiting or diarrhea. He  is here today primarily to discuss the cirrhosis.  At that time, my plan was: I believe the pain he was having in his right side is musculoskeletal. It is slowly improving and we decided to watch it for the next 2 weeks and allow her to gradually improve. We spent 20 minutes today discussing his cirrhosis. I recommended 30-50 pounds weight loss, a 1500-calorie a day diet, a vegetarian diet, switching glipizide to victoza for weight loss.  Patient would also like to touch base again with his hepatologist at [facility]. In the past he had a liver biopsy to confirm fatty liver disease. However he never has cirrhosis until now. I will schedule this as well  10/26/16 Unfortunately, the patient has not lost substantial weight since his last office visit. He is not engaging in any regular aerobic exercise although he states that he is trying to eat a more vegetarian diet. I spent more than 20 minutes today with the patient discussing a low saturated fat diet high in fruits and vegetables, 30 minutes a day 5 days a week of aerobic exercise, and recommending 30 pounds of weight loss over the next 6 months. I encouraged the patient to try to meet these goals and recheck his weight in 3-6 months to see how he is doing as a way to manage his fatty liver disease and cirrhosis. However his biggest concern today is rhinorrhea, hoarseness, and postnasal drip. Symptoms began roughly a week ago. He developed a scratchy throat with postnasal drainage. He then developed  hoarseness that comes and goes and a nonproductive cough that is dry.  He feels as though his throat and his airways or irritated. He does not feel ill. He is congested. Past Medical History:  Diagnosis Date  . Diabetes mellitus without complication ([place])   . Hyperlipidemia   . Hypertension   . Nephrolithiasis    Past Surgical History:  Procedure Laterality Date  . JOINT REPLACEMENT     left hip Dr Noemi Chapel  . SPINE SURGERY     disectomy   Current Outpatient  Prescriptions on File Prior to Visit  Medication Sig Dispense Refill  . amLODipine (NORVASC) 5 MG tablet TAKE 1 TABLET BY MOUTH ONCE DAILY 90 tablet 2  . aspirin EC 81 MG tablet Take 81 mg by mouth daily.    Marland Kitchen glimepiride (AMARYL) 4 MG tablet TAKE ONE TABLET BY MOUTH ONCE DAILY 90 tablet 1  . losartan (COZAAR) 100 MG tablet Take 1 tablet (100 mg total) by mouth daily. 90 tablet 3  . metFORMIN (GLUCOPHAGE) 500 MG tablet TAKE ONE TABLET BY MOUTH TWICE DAILY WITH  A  MEAL 60 tablet 3  . metoprolol succinate (TOPROL-XL) 25 MG 24 hr tablet TAKE 1 TABLET BY MOUTH ONCE DAILY 90 tablet 3  . traMADol (ULTRAM) 50 MG tablet Take 1-2 tablets (50-100 mg total) by mouth every 8 (eight) hours as needed. 30 tablet 0   No current facility-administered medications on file prior to visit.    No Known Allergies Social History   Social History  . Marital status: Single    Spouse name: N/A  . Number of children: N/A  . Years of education: N/A   Occupational History  . Not on file.   Social History Main Topics  . Smoking status: Never Smoker  . Smokeless tobacco: Never Used  . Alcohol use No  . Drug use: No  . Sexual activity: Not on file   Other Topics Concern  . Not on file   Social History Narrative  . No narrative on file      Review of Systems  All other systems reviewed and are negative.      Objective:   Physical Exam  Constitutional: He appears well-developed and well-nourished. No distress.  HENT:  Right Ear: Tympanic membrane, external ear and ear canal normal. Tympanic membrane is not injected and not erythematous.  Left Ear: Tympanic membrane, external ear and ear canal normal. Tympanic membrane is not injected and not erythematous.  Nose: Mucosal edema and rhinorrhea present. Right sinus exhibits no maxillary sinus tenderness and no frontal sinus tenderness. Left sinus exhibits no maxillary sinus tenderness and no frontal sinus tenderness.  Mouth/Throat: Oropharynx is clear  and moist. No oropharyngeal exudate.  Cardiovascular: Normal rate, regular rhythm and normal heart sounds.   Pulmonary/Chest: Effort normal and breath sounds normal. No respiratory distress. He has no wheezes. He has no rales.  Abdominal: Soft. Bowel sounds are normal. He exhibits no distension and no mass. There is no tenderness. There is no rebound and no guarding.  Skin: He is not diaphoretic.  Vitals reviewed.         Assessment & Plan:  Environmental allergies I believe the patient is suffering from seasonal allergies most likely to ragweed. I believe this is causing postnasal drip, irritating his throat and also causing laryngitis as well as his dry cough. I recommended trying Zyrtec 10 mg a day and Flonase 2 sprays each nostril daily. I also spent more than 20 minutes with  the patient discussing therapeutic lifestyle changes including diet exercise and weight loss to address his fatty liver disease, his cirrhosis, his obesity, and his diabetes. I will see the patient back in 3 months to recheck his weight and monitor his lab work

## 2016-11-07 LAB — HM DIABETES EYE EXAM

## 2016-11-08 ENCOUNTER — Encounter: Payer: Self-pay | Admitting: Family Medicine

## 2016-11-08 DIAGNOSIS — I10 Essential (primary) hypertension: Secondary | ICD-10-CM | POA: Insufficient documentation

## 2016-11-08 DIAGNOSIS — E785 Hyperlipidemia, unspecified: Secondary | ICD-10-CM | POA: Insufficient documentation

## 2016-11-08 DIAGNOSIS — E1165 Type 2 diabetes mellitus with hyperglycemia: Secondary | ICD-10-CM | POA: Insufficient documentation

## 2016-11-08 DIAGNOSIS — IMO0002 Reserved for concepts with insufficient information to code with codable children: Secondary | ICD-10-CM | POA: Insufficient documentation

## 2016-11-08 DIAGNOSIS — Z87442 Personal history of urinary calculi: Secondary | ICD-10-CM | POA: Insufficient documentation

## 2016-11-29 ENCOUNTER — Other Ambulatory Visit: Payer: Self-pay | Admitting: Family Medicine

## 2017-01-01 ENCOUNTER — Other Ambulatory Visit: Payer: Self-pay | Admitting: Family Medicine

## 2017-01-02 NOTE — Telephone Encounter (Signed)
Patient needs to be seen before any further refills 

## 2017-03-29 ENCOUNTER — Encounter: Payer: Self-pay | Admitting: Family Medicine

## 2017-03-29 ENCOUNTER — Ambulatory Visit (INDEPENDENT_AMBULATORY_CARE_PROVIDER_SITE_OTHER): Payer: No Typology Code available for payment source | Admitting: Family Medicine

## 2017-03-29 VITALS — BP 134/68 | HR 76 | Temp 98.2°F | Resp 16 | Ht 72.0 in | Wt 266.0 lb

## 2017-03-29 DIAGNOSIS — Z23 Encounter for immunization: Secondary | ICD-10-CM | POA: Diagnosis not present

## 2017-03-29 DIAGNOSIS — E7800 Pure hypercholesterolemia, unspecified: Secondary | ICD-10-CM | POA: Diagnosis not present

## 2017-03-29 DIAGNOSIS — K7460 Unspecified cirrhosis of liver: Secondary | ICD-10-CM

## 2017-03-29 DIAGNOSIS — R0989 Other specified symptoms and signs involving the circulatory and respiratory systems: Secondary | ICD-10-CM

## 2017-03-29 DIAGNOSIS — E119 Type 2 diabetes mellitus without complications: Secondary | ICD-10-CM | POA: Diagnosis not present

## 2017-03-29 DIAGNOSIS — I10 Essential (primary) hypertension: Secondary | ICD-10-CM

## 2017-03-29 NOTE — Addendum Note (Signed)
Addended by: Shary Decamp B on: 03/29/2017 04:31 PM   Modules accepted: Orders

## 2017-03-29 NOTE — Progress Notes (Signed)
Subjective:    Patient ID: Joel Reed, male    DOB: [birth year], 67 y.o.   MRN: [id number]  HPI  08/28/16 Thursday night, the patient had shrimp at a business meeting. Shortly thereafter he developed watery diarrhea. Watery diarrhea persisted for 24-48 hours and then subsided. He denies any nausea or vomiting. However over the entire weekend, he has had near constant pain in his right upper quadrant. He is tender to palpation on the lower edge of the liver. There is no obvious hepatomegaly. He denies any constipation. He denies any diarrhea. He denies any melena or hematochezia. He denies any vomiting. He denies any pain with food. He denies any fevers or chills. Pain is made worse when he is sitting still or with movement. He also reports pain with deep inspiration. He denies any cough or shortness of breath.  At that time, my plan was: Check CBC to evaluate for leukocytosis, CMP to check liver function test. I will check a acute hepatitis panel to rule out hepatitis A. I will also obtain a right upper quadrant ultrasound given the pain in the area to rule out cholelithiasis, cholecystitis. My suspicion is that he has food poisoning and with pain in the right upper quadrant is musculoskeletal in nature. Therefore if right upper quadrant ultrasound is normal, lab work is normal, I will treat the patient with medication to ease the pain. Await the results of the right upper quadrant ultrasound today  09/06/16 Right upper quadrant ultrasound revealed no cause of the patient's pain. CT scan revealed cirrhosis with mild splenomegaly but otherwise revealed no cause for his pain. Chest x-ray was clear. Patient states that his pain is gradually improving. It is now brought on only by movement. If he twists or turns the pain worse on the right side. He takes a deep breath in the pain will worsen. It sounds musculoskeletal/abdominal wall related. He denies any fever. He denies any nausea vomiting or diarrhea. He  is here today primarily to discuss the cirrhosis.  At that time, my plan was:  I believe the pain he was having in his right side is musculoskeletal. It is slowly improving and we decided to watch it for the next 2 weeks and allow her to gradually improve. We spent 20 minutes today discussing his cirrhosis. I recommended 30-50 pounds weight loss, a 1500-calorie a day diet, a vegetarian diet, switching glipizide to victoza for weight loss.  Patient would also like to touch base again with his hepatologist at [facility]. In the past he had a liver biopsy to confirm fatty liver disease. However he never has cirrhosis until now. I will schedule this as well   03/29/17 Patient is here today for follow-up.  His weight is essentially unchanged since his visit last year.  Patient has cirrhosis secondary to fatty liver disease.  He admits that he is not exercising.  He is eating a low carbohydrate low saturated fat diet.  He denies any polyuria, polydipsia, or blurry vision.  He denies any chest pain or shortness of breath.  He denies any myalgias or right upper quadrant pain.  He has significant edema in both lower extremities with tight firm fibrotic skin at the ankles.  Please see the findings on diabetic foot exam.  Patient has chronic venous stasis changes in both lower extremities.  He denies any neuropathy in his feet however he does have onychomycosis on his toenails.  He is due for Pneumovax 23.  He is already had  his flu shot. Past Medical History:  Diagnosis Date  . Diabetes mellitus without complication ([place])   . Hyperlipidemia   . Hypertension   . Nephrolithiasis    Past Surgical History:  Procedure Laterality Date  . JOINT REPLACEMENT     left hip Dr Noemi Chapel  . SPINE SURGERY     disectomy   Current Outpatient Medications on File Prior to Visit  Medication Sig Dispense Refill  . amLODipine (NORVASC) 5 MG tablet TAKE 1 TABLET BY MOUTH ONCE DAILY 90 tablet 2  . aspirin EC 81 MG tablet Take 81 mg by  mouth daily.    . cetirizine (ZYRTEC) 10 MG tablet Take 1 tablet (10 mg total) by mouth daily. 30 tablet 11  . fluticasone (FLONASE) 50 MCG/ACT nasal spray Place 2 sprays into both nostrils daily. 16 g 6  . glimepiride (AMARYL) 4 MG tablet TAKE ONE TABLET BY MOUTH ONCE DAILY 90 tablet 1  . losartan (COZAAR) 100 MG tablet Take 1 tablet (100 mg total) by mouth daily. 90 tablet 3  . metFORMIN (GLUCOPHAGE) 500 MG tablet TAKE 1 TABLET BY MOUTH TWICE DAILY WITH MEALS 180 tablet 0  . metoprolol succinate (TOPROL-XL) 25 MG 24 hr tablet TAKE 1 TABLET BY MOUTH ONCE DAILY 90 tablet 3   No current facility-administered medications on file prior to visit.    No Known Allergies Social History   Socioeconomic History  . Marital status: Single    Spouse name: Not on file  . Number of children: Not on file  . Years of education: Not on file  . Highest education level: Not on file  Social Needs  . Financial resource strain: Not on file  . Food insecurity - worry: Not on file  . Food insecurity - inability: Not on file  . Transportation needs - medical: Not on file  . Transportation needs - non-medical: Not on file  Occupational History  . Not on file  Tobacco Use  . Smoking status: Never Smoker  . Smokeless tobacco: Never Used  Substance and Sexual Activity  . Alcohol use: No  . Drug use: No  . Sexual activity: Not on file  Other Topics Concern  . Not on file  Social History Narrative  . Not on file      Review of Systems  All other systems reviewed and are negative.      Objective:   Physical Exam  Constitutional: He appears well-developed and well-nourished. No distress.  Neck: No JVD present.  Cardiovascular: Normal rate, regular rhythm and normal heart sounds. Exam reveals no gallop and no friction rub.  No murmur heard. Pulmonary/Chest: Effort normal and breath sounds normal. No respiratory distress. He has no wheezes. He has no rales.  Abdominal: Soft. Bowel sounds are  normal. He exhibits no distension and no mass. There is no tenderness. There is no rebound and no guarding.  Musculoskeletal: He exhibits edema.  Skin: Rash noted. He is not diaphoretic. There is erythema.  Vitals reviewed.         Assessment & Plan:  Controlled type 2 diabetes mellitus without complication, without long-term current use of insulin ([place]) - Plan: Hemoglobin A1c, COMPLETE METABOLIC PANEL WITH GFR, CBC with Differential/Platelet, Lipid panel, Microalbumin, urine  Cirrhosis of liver without ascites, unspecified hepatic cirrhosis type (HCC)  Benign essential HTN  Pure hypercholesterolemia  Decreased pedal pulses - Plan: VAS [place] ABI WITH/WO TBI  Given the diminished pulses in both feet, I will send the patient for an  ultrasound with ankle-brachial indexes to evaluate for peripheral vascular disease.  Regarding his cirrhosis I encourage the patient to exercise 30 minutes a day 5 days a week and try to lose 30-40 pounds.  His blood pressure today is well controlled.  I will check a fasting lipid panel.  Goal LDL cholesterol is less than 100.  I will also check a hemoglobin A1c.  Goal hemoglobin A1c is less than 6.5.  May want to consider switching the patient to Victoza for weight loss.  Check urine microalbumin as well.  Patient received Pneumovax 23 today in clinic.

## 2017-03-30 LAB — COMPLETE METABOLIC PANEL WITH GFR
AG Ratio: 1.2 (calc) (ref 1.0–2.5)
ALT: 44 U/L (ref 9–46)
AST: 43 U/L — ABNORMAL HIGH (ref 10–35)
Albumin: 3.8 g/dL (ref 3.6–5.1)
Alkaline phosphatase (APISO): 96 U/L (ref 40–115)
BUN: 11 mg/dL (ref 7–25)
CO2: 26 mmol/L (ref 20–32)
Calcium: 8.7 mg/dL (ref 8.6–10.3)
Chloride: 104 mmol/L (ref 98–110)
Creat: 0.79 mg/dL (ref 0.70–1.25)
GFR, Est African American: 110 mL/min/{1.73_m2} (ref >=60)
GFR, Est Non African American: 95 mL/min/{1.73_m2} (ref >=60)
Globulin: 3.2 g/dL (calc) (ref 1.9–3.7)
Glucose, Bld: 70 mg/dL (ref 65–99)
Potassium: 3.8 mmol/L (ref 3.5–5.3)
Sodium: 139 mmol/L (ref 135–146)
Total Bilirubin: 0.8 mg/dL (ref 0.2–1.2)
Total Protein: 7 g/dL (ref 6.1–8.1)

## 2017-03-30 LAB — CBC WITH DIFFERENTIAL/PLATELET
Basophils Absolute: 18 cells/uL (ref 0–200)
Basophils Relative: 0.4 %
Eosinophils Absolute: 78 cells/uL (ref 15–500)
Eosinophils Relative: 1.7 %
HCT: 36.5 % — ABNORMAL LOW (ref 38.5–50.0)
Hemoglobin: 12.4 g/dL — ABNORMAL LOW (ref 13.2–17.1)
Lymphs Abs: 1320 cells/uL (ref 850–3900)
MCH: 28.4 pg (ref 27.0–33.0)
MCHC: 34 g/dL (ref 32.0–36.0)
MCV: 83.7 fL (ref 80.0–100.0)
MPV: 13.2 fL — ABNORMAL HIGH (ref 7.5–12.5)
Monocytes Relative: 10 %
Neutro Abs: 2723 cells/uL (ref 1500–7800)
Neutrophils Relative %: 59.2 %
Platelets: 146 10*3/uL (ref 140–400)
RBC: 4.36 10*6/uL (ref 4.20–5.80)
RDW: 12.4 % (ref 11.0–15.0)
Total Lymphocyte: 28.7 %
WBC mixed population: 460 cells/uL (ref 200–950)
WBC: 4.6 10*3/uL (ref 3.8–10.8)

## 2017-03-30 LAB — LIPID PANEL
Cholesterol: 100 mg/dL (ref <200)
HDL: 27 mg/dL — ABNORMAL LOW (ref >40)
LDL Cholesterol (Calc): 59 mg/dL (calc)
Non-HDL Cholesterol (Calc): 73 mg/dL (calc) (ref <130)
Total CHOL/HDL Ratio: 3.7 (calc) (ref <5.0)
Triglycerides: 59 mg/dL (ref <150)

## 2017-03-30 LAB — HEMOGLOBIN A1C
Hgb A1c MFr Bld: 6.3 % of total Hgb — ABNORMAL HIGH (ref <5.7)
Mean Plasma Glucose: 134 (calc)
eAG (mmol/L): 7.4 (calc)

## 2017-03-30 LAB — MICROALBUMIN, URINE: Microalb, Ur: 3.8 mg/dL

## 2017-03-31 ENCOUNTER — Other Ambulatory Visit: Payer: Self-pay | Admitting: Family Medicine

## 2017-04-03 ENCOUNTER — Other Ambulatory Visit: Payer: Self-pay | Admitting: Family Medicine

## 2017-04-03 DIAGNOSIS — I10 Essential (primary) hypertension: Secondary | ICD-10-CM

## 2017-04-03 DIAGNOSIS — E785 Hyperlipidemia, unspecified: Secondary | ICD-10-CM

## 2017-04-03 DIAGNOSIS — Z79899 Other long term (current) drug therapy: Secondary | ICD-10-CM

## 2017-04-03 DIAGNOSIS — E1165 Type 2 diabetes mellitus with hyperglycemia: Secondary | ICD-10-CM

## 2017-04-12 ENCOUNTER — Ambulatory Visit (HOSPITAL_COMMUNITY)
Admission: RE | Admit: 2017-04-12 | Discharge: 2017-04-12 | Disposition: A | Discharge status: Home / Self Care | Payer: 59 | Source: Ambulatory Visit | Attending: Family Medicine | Admitting: Family Medicine

## 2017-04-12 DIAGNOSIS — R0989 Other specified symptoms and signs involving the circulatory and respiratory systems: Secondary | ICD-10-CM | POA: Diagnosis not present

## 2017-04-12 NOTE — Progress Notes (Signed)
Bilateral ABIs and TBI are completed. ABIs indicate normal arterial flow bilaterally at rest. The right TBI is abnormal and the left is normal. [facility], RVS 04/12/2017, 2:48 PM

## 2017-04-19 ENCOUNTER — Encounter: Payer: Self-pay | Admitting: Family Medicine

## 2017-04-19 DIAGNOSIS — I739 Peripheral vascular disease, unspecified: Secondary | ICD-10-CM | POA: Insufficient documentation

## 2017-07-03 ENCOUNTER — Other Ambulatory Visit: Payer: Self-pay | Admitting: Family Medicine

## 2017-07-13 ENCOUNTER — Other Ambulatory Visit: Payer: Self-pay | Admitting: Family Medicine

## 2017-08-19 ENCOUNTER — Other Ambulatory Visit: Payer: Self-pay | Admitting: Family Medicine

## 2017-08-19 MED ORDER — LANCETS MISC. MISC: 3 refills | Status: DC

## 2017-08-19 MED ORDER — GLUCOSE BLOOD VI STRP: ORAL_STRIP | 12 refills | Status: DC

## 2017-08-19 MED ORDER — CONTOUR NEXT MONITOR W/DEVICE KIT: 1.0000 | PACK | Freq: Every day | 0 refills | Status: DC

## 2017-09-13 DIAGNOSIS — M9905 Segmental and somatic dysfunction of pelvic region: Secondary | ICD-10-CM | POA: Diagnosis not present

## 2017-09-13 DIAGNOSIS — M9902 Segmental and somatic dysfunction of thoracic region: Secondary | ICD-10-CM | POA: Diagnosis not present

## 2017-09-13 DIAGNOSIS — M545 Low back pain: Secondary | ICD-10-CM | POA: Diagnosis not present

## 2017-09-13 DIAGNOSIS — M9903 Segmental and somatic dysfunction of lumbar region: Secondary | ICD-10-CM | POA: Diagnosis not present

## 2017-09-17 DIAGNOSIS — M545 Low back pain: Secondary | ICD-10-CM | POA: Diagnosis not present

## 2017-09-17 DIAGNOSIS — M9905 Segmental and somatic dysfunction of pelvic region: Secondary | ICD-10-CM | POA: Diagnosis not present

## 2017-09-17 DIAGNOSIS — M9903 Segmental and somatic dysfunction of lumbar region: Secondary | ICD-10-CM | POA: Diagnosis not present

## 2017-09-17 DIAGNOSIS — M9902 Segmental and somatic dysfunction of thoracic region: Secondary | ICD-10-CM | POA: Diagnosis not present

## 2017-09-19 DIAGNOSIS — M9903 Segmental and somatic dysfunction of lumbar region: Secondary | ICD-10-CM | POA: Diagnosis not present

## 2017-09-19 DIAGNOSIS — M545 Low back pain: Secondary | ICD-10-CM | POA: Diagnosis not present

## 2017-09-19 DIAGNOSIS — M9902 Segmental and somatic dysfunction of thoracic region: Secondary | ICD-10-CM | POA: Diagnosis not present

## 2017-09-19 DIAGNOSIS — M9905 Segmental and somatic dysfunction of pelvic region: Secondary | ICD-10-CM | POA: Diagnosis not present

## 2017-09-24 DIAGNOSIS — M9905 Segmental and somatic dysfunction of pelvic region: Secondary | ICD-10-CM | POA: Diagnosis not present

## 2017-09-24 DIAGNOSIS — M9903 Segmental and somatic dysfunction of lumbar region: Secondary | ICD-10-CM | POA: Diagnosis not present

## 2017-09-24 DIAGNOSIS — M545 Low back pain: Secondary | ICD-10-CM | POA: Diagnosis not present

## 2017-09-24 DIAGNOSIS — M9902 Segmental and somatic dysfunction of thoracic region: Secondary | ICD-10-CM | POA: Diagnosis not present

## 2017-10-01 ENCOUNTER — Other Ambulatory Visit: Payer: Self-pay | Admitting: Family Medicine

## 2017-10-01 ENCOUNTER — Other Ambulatory Visit: Payer: Self-pay

## 2017-10-01 DIAGNOSIS — Z79899 Other long term (current) drug therapy: Secondary | ICD-10-CM

## 2017-10-01 DIAGNOSIS — E1165 Type 2 diabetes mellitus with hyperglycemia: Secondary | ICD-10-CM

## 2017-10-01 DIAGNOSIS — I10 Essential (primary) hypertension: Secondary | ICD-10-CM | POA: Diagnosis not present

## 2017-10-01 DIAGNOSIS — E785 Hyperlipidemia, unspecified: Secondary | ICD-10-CM

## 2017-10-01 MED ORDER — LANCETS MISC. MISC: 3 refills | Status: DC

## 2017-10-01 MED ORDER — GLUCOSE BLOOD VI STRP: ORAL_STRIP | 12 refills | Status: DC

## 2017-10-01 MED ORDER — LOSARTAN POTASSIUM 100 MG PO TABS: 100.0000 mg | ORAL_TABLET | Freq: Every day | ORAL | 0 refills | Status: DC

## 2017-10-01 MED ORDER — METFORMIN HCL 500 MG PO TABS: 500.0000 mg | ORAL_TABLET | Freq: Two times a day (BID) | ORAL | 0 refills | Status: DC

## 2017-10-01 MED ORDER — METOPROLOL SUCCINATE ER 25 MG PO TB24: 25.0000 mg | ORAL_TABLET | Freq: Every day | ORAL | 0 refills | Status: DC

## 2017-10-01 MED ORDER — GLIMEPIRIDE 4 MG PO TABS: 4.0000 mg | ORAL_TABLET | Freq: Every day | ORAL | 0 refills | Status: DC

## 2017-10-01 NOTE — Telephone Encounter (Signed)
Please call pt to make an appointment for lab follow up with Dr. Dennard Schaumann.   Refill sent for 90 day to [facility].

## 2017-10-01 NOTE — Telephone Encounter (Signed)
Pt's wife in to have strips and lancets sent to pharm and apt made.

## 2017-10-01 NOTE — Telephone Encounter (Signed)
Patient came in this morning with the understanding he had appt with dr pickard, but only had lab work done  He needs refills on his losartan, glimepiride, and metoprolol, and metformin  [facility] [place]

## 2017-10-03 LAB — CBC WITH DIFFERENTIAL/PLATELET
Basophils Absolute: 19 cells/uL (ref 0–200)
Basophils Relative: 0.6 %
Eosinophils Absolute: 68 cells/uL (ref 15–500)
Eosinophils Relative: 2.2 %
HCT: 31.8 % — ABNORMAL LOW (ref 38.5–50.0)
Hemoglobin: 10.4 g/dL — ABNORMAL LOW (ref 13.2–17.1)
Lymphs Abs: 840 cells/uL — ABNORMAL LOW (ref 850–3900)
MCH: 24.8 pg — ABNORMAL LOW (ref 27.0–33.0)
MCHC: 32.7 g/dL (ref 32.0–36.0)
MCV: 75.9 fL — ABNORMAL LOW (ref 80.0–100.0)
MPV: 13.7 fL — ABNORMAL HIGH (ref 7.5–12.5)
Monocytes Relative: 13.7 %
Neutro Abs: 1748 cells/uL (ref 1500–7800)
Neutrophils Relative %: 56.4 %
Platelets: 114 10*3/uL — ABNORMAL LOW (ref 140–400)
RBC: 4.19 10*6/uL — ABNORMAL LOW (ref 4.20–5.80)
RDW: 13.9 % (ref 11.0–15.0)
Total Lymphocyte: 27.1 %
WBC mixed population: 425 cells/uL (ref 200–950)
WBC: 3.1 10*3/uL — ABNORMAL LOW (ref 3.8–10.8)

## 2017-10-03 LAB — HEMOGLOBIN A1C
Hgb A1c MFr Bld: 6 % of total Hgb — ABNORMAL HIGH (ref <5.7)
Mean Plasma Glucose: 126 (calc)
eAG (mmol/L): 7 (calc)

## 2017-10-03 LAB — COMPREHENSIVE METABOLIC PANEL
AG Ratio: 1.2 (calc) (ref 1.0–2.5)
ALT: 36 U/L (ref 9–46)
AST: 35 U/L (ref 10–35)
Albumin: 3.9 g/dL (ref 3.6–5.1)
Alkaline phosphatase (APISO): 94 U/L (ref 40–115)
BUN: 13 mg/dL (ref 7–25)
CO2: 23 mmol/L (ref 20–32)
Calcium: 8.3 mg/dL — ABNORMAL LOW (ref 8.6–10.3)
Chloride: 106 mmol/L (ref 98–110)
Creat: 0.73 mg/dL (ref 0.70–1.25)
Globulin: 3.2 g/dL (calc) (ref 1.9–3.7)
Glucose, Bld: 133 mg/dL — ABNORMAL HIGH (ref 65–99)
Potassium: 4.3 mmol/L (ref 3.5–5.3)
Sodium: 137 mmol/L (ref 135–146)
Total Bilirubin: 0.6 mg/dL (ref 0.2–1.2)
Total Protein: 7.1 g/dL (ref 6.1–8.1)

## 2017-10-03 LAB — LIPID PANEL
Cholesterol: 102 mg/dL (ref <200)
HDL: 25 mg/dL — ABNORMAL LOW (ref >40)
LDL Cholesterol (Calc): 64 mg/dL (calc)
Non-HDL Cholesterol (Calc): 77 mg/dL (calc) (ref <130)
Total CHOL/HDL Ratio: 4.1 (calc) (ref <5.0)
Triglycerides: 57 mg/dL (ref <150)

## 2017-10-10 ENCOUNTER — Ambulatory Visit: Payer: Medicare Other | Admitting: Family Medicine

## 2017-10-10 ENCOUNTER — Ambulatory Visit (INDEPENDENT_AMBULATORY_CARE_PROVIDER_SITE_OTHER): Payer: Medicare Other | Admitting: Family Medicine

## 2017-10-10 VITALS — BP 152/74 | HR 64 | Temp 98.0°F | Wt 264.0 lb

## 2017-10-10 DIAGNOSIS — I10 Essential (primary) hypertension: Secondary | ICD-10-CM

## 2017-10-10 DIAGNOSIS — R6889 Other general symptoms and signs: Secondary | ICD-10-CM | POA: Diagnosis not present

## 2017-10-10 DIAGNOSIS — D509 Iron deficiency anemia, unspecified: Secondary | ICD-10-CM

## 2017-10-10 DIAGNOSIS — K7460 Unspecified cirrhosis of liver: Secondary | ICD-10-CM | POA: Diagnosis not present

## 2017-10-10 DIAGNOSIS — E119 Type 2 diabetes mellitus without complications: Secondary | ICD-10-CM | POA: Diagnosis not present

## 2017-10-10 NOTE — Progress Notes (Signed)
Subjective:    Patient ID: Joel Reed, male    DOB: October 01, 1950, 67 y.o.   MRN: [id number]  Medication Refill     08/28/16 Thursday night, the patient had shrimp at a business meeting. Shortly thereafter he developed watery diarrhea. Watery diarrhea persisted for 24-48 hours and then subsided. He denies any nausea or vomiting. However over the entire weekend, he has had near constant pain in his right upper quadrant. He is tender to palpation on the lower edge of the liver. There is no obvious hepatomegaly. He denies any constipation. He denies any diarrhea. He denies any melena or hematochezia. He denies any vomiting. He denies any pain with food. He denies any fevers or chills. Pain is made worse when he is sitting still or with movement. He also reports pain with deep inspiration. He denies any cough or shortness of breath.  At that time, my plan was: Check CBC to evaluate for leukocytosis, CMP to check liver function test. I will check a acute hepatitis panel to rule out hepatitis A. I will also obtain a right upper quadrant ultrasound given the pain in the area to rule out cholelithiasis, cholecystitis. My suspicion is that he has food poisoning and with pain in the right upper quadrant is musculoskeletal in nature. Therefore if right upper quadrant ultrasound is normal, lab work is normal, I will treat the patient with medication to ease the pain. Await the results of the right upper quadrant ultrasound today  09/06/16 Right upper quadrant ultrasound revealed no cause of the patient's pain. CT scan revealed cirrhosis with mild splenomegaly but otherwise revealed no cause for his pain. Chest x-ray was clear. Patient states that his pain is gradually improving. It is now brought on only by movement. If he twists or turns the pain worse on the right side. He takes a deep breath in the pain will worsen. It sounds musculoskeletal/abdominal wall related. He denies any fever. He denies any nausea  vomiting or diarrhea. He is here today primarily to discuss the cirrhosis.  At that time, my plan was:  I believe the pain he was having in his right side is musculoskeletal. It is slowly improving and we decided to watch it for the next 2 weeks and allow her to gradually improve. We spent 20 minutes today discussing his cirrhosis. I recommended 30-50 pounds weight loss, a 1500-calorie a day diet, a vegetarian diet, switching glipizide to victoza for weight loss.  Patient would also like to touch base again with his hepatologist at [facility]. In the past he had a liver biopsy to confirm fatty liver disease. However he never has cirrhosis until now. I will schedule this as well   03/29/17 Patient is here today for follow-up.  His weight is essentially unchanged since his visit last year.  Patient has cirrhosis secondary to fatty liver disease.  He admits that he is not exercising.  He is eating a low carbohydrate low saturated fat diet.  He denies any polyuria, polydipsia, or blurry vision.  He denies any chest pain or shortness of breath.  He denies any myalgias or right upper quadrant pain.  He has significant edema in both lower extremities with tight firm fibrotic skin at the ankles.  Please see the findings on diabetic foot exam.  Patient has chronic venous stasis changes in both lower extremities.  He denies any neuropathy in his feet however he does have onychomycosis on his toenails.  He is due for Pneumovax 23.  He is already had his flu shot.  At that time, my plan was: Given the diminished pulses in both feet, I will send the patient for an ultrasound with ankle-brachial indexes to evaluate for peripheral vascular disease.  Regarding his cirrhosis I encourage the patient to exercise 30 minutes a day 5 days a week and try to lose 30-40 pounds.  His blood pressure today is well controlled.  I will check a fasting lipid panel.  Goal LDL cholesterol is less than 100.  I will also check a hemoglobin A1c.  Goal  hemoglobin A1c is less than 6.5.  May want to consider switching the patient to Victoza for weight loss.  Check urine microalbumin as well.  Patient received Pneumovax 23 today in clinic.  10/10/17 Patient is here today for follow-up.  His most recent lab work is listed below.  His hemoglobin A1c has fallen from 6.3-6.0!.  I am very proud of that.  He is having occasional episodes of hypoglycemia.  He is still taking Amaryl 4 mg a day in addition to his metformin.  I have recommended decreasing the dose of Amaryl to 2 mg a day to avoid hypoglycemia.  He denies any polyuria, polydipsia, or blurry vision.  His immunizations are up-to-date except for seasonal flu shot.  His blood pressure today is significantly elevated at 152/74.  He is not checking his blood pressure at home but he states that he is never seen his blood pressure that high.  He is not experiencing any chest pain shortness of breath or dyspnea on exertion.  His liver function test are normal!.  He denies any myalgias or right upper quadrant pain.  His cholesterol is excellent and his LDL cholesterol is well below 100.  He continues to have low HDL cholesterol and he admits that he is not getting any aerobic exercise.  Unfortunately there is been a significant drop in his white blood cell count, his hemoglobin, and his platelet count.  Past medical history is significant for cirrhosis suspected due to [place].  He denies any melena or hematochezia. Past Medical History:  Diagnosis Date  . Diabetes mellitus without complication ([place])   . Hyperlipidemia   . Hypertension   . Nephrolithiasis   . 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   Past Surgical History:  Procedure Laterality Date  . JOINT REPLACEMENT     left hip Dr Noemi Chapel  . SPINE SURGERY     disectomy   Current Outpatient Medications on File Prior to Visit  Medication Sig Dispense Refill  . amLODipine (NORVASC) 5 MG tablet TAKE 1 TABLET BY MOUTH ONCE  DAILY 90 tablet 2  . aspirin EC 81 MG tablet Take 81 mg by mouth daily.    . Blood Glucose Monitoring Suppl (CONTOUR NEXT MONITOR) w/Device KIT 1 strip by Does not apply route daily. 1 kit 0  . glimepiride (AMARYL) 4 MG tablet Take 1 tablet (4 mg total) by mouth daily. 90 tablet 0  . glucose blood (CONTOUR NEXT TEST) test strip Check bs daily DX: E11.9 100 each 12  . Lancets Misc. MISC Use with daily bs check 100 each 3  . losartan (COZAAR) 100 MG tablet Take 1 tablet (100 mg total) by mouth daily. 90 tablet 0  . metFORMIN (GLUCOPHAGE) 500 MG tablet Take 1 tablet (500 mg total) by mouth 2 (two) times daily with a meal. 180 tablet 0  . metoprolol succinate (TOPROL-XL) 25 MG 24 hr  tablet Take 1 tablet (25 mg total) by mouth daily. 90 tablet 0   No current facility-administered medications on file prior to visit.    No Known Allergies Social History   Socioeconomic History  . Marital status: Married    Spouse name: Not on file  . Number of children: Not on file  . Years of education: Not on file  . Highest education level: Not on file  Occupational History  . Not on file  Social Needs  . Financial resource strain: Not on file  . Food insecurity:    Worry: Not on file    Inability: Not on file  . Transportation needs:    Medical: Not on file    Non-medical: Not on file  Tobacco Use  . Smoking status: Never Smoker  . Smokeless tobacco: Never Used  Substance and Sexual Activity  . Alcohol use: No  . Drug use: No  . Sexual activity: Not on file  Lifestyle  . Physical activity:    Days per week: Not on file    Minutes per session: Not on file  . Stress: Not on file  Relationships  . Social connections:    Talks on phone: Not on file    Gets together: Not on file    Attends religious service: Not on file    Active member of club or organization: Not on file    Attends meetings of clubs or organizations: Not on file    Relationship status: Not on file  . Intimate partner  violence:    Fear of current or ex partner: Not on file    Emotionally abused: Not on file    Physically abused: Not on file    Forced sexual activity: Not on file  Other Topics Concern  . Not on file  Social History Narrative  . Not on file      Review of Systems  All other systems reviewed and are negative.      Objective:   Physical Exam  Constitutional: He appears well-developed and well-nourished. No distress.  Neck: No JVD present.  Cardiovascular: Normal rate, regular rhythm and normal heart sounds. Exam reveals no gallop and no friction rub.  No murmur heard. Pulmonary/Chest: Effort normal and breath sounds normal. No respiratory distress. He has no wheezes. He has no rales.  Abdominal: Soft. Bowel sounds are normal. He exhibits no distension and no mass. There is no tenderness. There is no rebound and no guarding.  Musculoskeletal: He exhibits edema.  Skin: He is not diaphoretic.  Vitals reviewed.         Assessment & Plan:  Microcytic anemia - Plan: Ferritin, Vitamin B12, Iron, Fecal Globin By Immunochemistry  HTN (hypertension), benign  Controlled type 2 diabetes mellitus without complication, without long-term current use of insulin (HCC)  Cirrhosis of liver without ascites, unspecified hepatic cirrhosis type ([place])  I am concerned by his pancytopenia.  I will check an iron level, ferritin, and vitamin B12 to evaluate for any vitamin or mineral deficiencies.  I have recommended stool cards to rule out an occult GI bleed.  If positive, I would recommend an EGD to evaluate for esophageal varices given his history of cirrhosis.  I am concerned by his blood pressure and I recommended that he buy blood pressure cuff and check it every day and notify me of the values in 1 week so that we can adjust his blood pressure medicine further.  Diabetes test is excellent.  I would decrease  Amaryl to 2 mg a day and recheck fasting lab work in 3 months.  Cholesterol is excellent  I have encouraged him exercise 30 minutes a day 5 days a week to address his dyslipidemia.  If there is no evidence of a GI bleed and his vitamin and mineral levels are normal, I would recommend an oncology consultation for possible myelofibrosis, etc. however it is possible that his pancytopenia is secondary to his cirrhosis

## 2017-10-11 LAB — VITAMIN B12: Vitamin B-12: 299 pg/mL (ref 200–1100)

## 2017-10-11 LAB — IRON: Iron: 30 ug/dL — ABNORMAL LOW (ref 50–180)

## 2017-10-11 LAB — FERRITIN: Ferritin: 12 ng/mL — ABNORMAL LOW (ref 24–380)

## 2017-10-22 ENCOUNTER — Other Ambulatory Visit: Payer: Medicare Other

## 2017-10-22 DIAGNOSIS — D509 Iron deficiency anemia, unspecified: Secondary | ICD-10-CM | POA: Diagnosis not present

## 2017-10-23 ENCOUNTER — Other Ambulatory Visit: Payer: Self-pay | Admitting: Family Medicine

## 2017-10-23 DIAGNOSIS — K921 Melena: Secondary | ICD-10-CM

## 2017-10-23 LAB — FECAL GLOBIN BY IMMUNOCHEMISTRY
FECAL GLOBIN RESULT:: DETECTED — AB
MICRO NUMBER:: 91081859
SPECIMEN QUALITY:: ADEQUATE

## 2017-11-01 ENCOUNTER — Encounter: Payer: Self-pay | Admitting: Physician Assistant

## 2017-11-01 ENCOUNTER — Ambulatory Visit (INDEPENDENT_AMBULATORY_CARE_PROVIDER_SITE_OTHER): Payer: Medicare Other | Admitting: Physician Assistant

## 2017-11-01 ENCOUNTER — Encounter (INDEPENDENT_AMBULATORY_CARE_PROVIDER_SITE_OTHER): Payer: Self-pay

## 2017-11-01 VITALS — BP 124/62 | HR 78 | Ht 72.0 in | Wt 262.8 lb

## 2017-11-01 DIAGNOSIS — R195 Other fecal abnormalities: Secondary | ICD-10-CM | POA: Diagnosis not present

## 2017-11-01 DIAGNOSIS — D509 Iron deficiency anemia, unspecified: Secondary | ICD-10-CM

## 2017-11-01 NOTE — Progress Notes (Addendum)
Chief Complaint: IDA, positive fecal globin  HPI:    Joel Reed is a 67 year old Caucasian male with a past medical history of diabetes and PVD, known to Dr. Hilarie Fredrickson, who was referred to me by Susy Frizzle, MD for a complaint of iron deficiency anemia and positive fecal globin.      08/02/2016 colonoscopy with fair preparation, one 6 mm polyp in the ascending colon, one 7 mm polyp in the transverse colon, two 3-4 mm polyps in the rectum and rectosigmoid colon and small internal hemorrhoids.  Pathology showed adenomatous polyps and repeat was recommended in 3 years.    10/01/2017 CBC with a hemoglobin of 10.4 (12.4 on 03/29/2017), MCV low at 75.9.  10/10/2017 ferritin low at 12, iron low at 30.  10/22/2017 fecal globin positive.    Today, patient resents to clinic accompanied by his wife and tells me that he is feeling fairly well but was told that he was anemic recently.  He was started on B12 and iron supplementation a couple of weeks ago.  His wife explains that he does have a family history of cancer but no colon cancer.  Patient tells me he has noticed no change in his GI system and has seen no bright red blood or black tarry stools.    Denies fever, chills, abdominal pain, shortness of breath, dyspnea on exertion, palpitations, dizziness, syncope, heartburn, reflux or symptoms that awaken him at night.  Past Medical History:  Diagnosis Date  . Diabetes mellitus without complication ([facility])   . Hyperlipidemia   . Hypertension   . Nephrolithiasis   . 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    Past Surgical History:  Procedure Laterality Date  . JOINT REPLACEMENT     left hip Dr Noemi Chapel  . SPINE SURGERY     disectomy    Current Outpatient Medications  Medication Sig Dispense Refill  . amLODipine (NORVASC) 5 MG tablet TAKE 1 TABLET BY MOUTH ONCE DAILY 90 tablet 2  . aspirin EC 81 MG tablet Take 81 mg by mouth daily.    . Blood Glucose Monitoring Suppl  (CONTOUR NEXT MONITOR) w/Device KIT 1 strip by Does not apply route daily. 1 kit 0  . glimepiride (AMARYL) 4 MG tablet Take 1 tablet (4 mg total) by mouth daily. (Patient taking differently: Take 2 mg by mouth daily. ) 90 tablet 0  . glucose blood (CONTOUR NEXT TEST) test strip Check bs daily DX: E11.9 100 each 12  . Lancets Misc. MISC Use with daily bs check 100 each 3  . losartan (COZAAR) 100 MG tablet Take 1 tablet (100 mg total) by mouth daily. 90 tablet 0  . metFORMIN (GLUCOPHAGE) 500 MG tablet Take 1 tablet (500 mg total) by mouth 2 (two) times daily with a meal. 180 tablet 0  . metoprolol succinate (TOPROL-XL) 25 MG 24 hr tablet Take 1 tablet (25 mg total) by mouth daily. 90 tablet 0   No current facility-administered medications for this visit.     Allergies as of 11/01/2017  . (No Known Allergies)    Family History  Problem Relation Age of Onset  . Colon cancer Neg Hx     Social History   Socioeconomic History  . Marital status: Married    Spouse name: Not on file  . Number of children: Not on file  . Years of education: Not on file  . Highest education level: Not on file  Occupational History  .  Not on file  Social Needs  . Financial resource strain: Not on file  . Food insecurity:    Worry: Not on file    Inability: Not on file  . Transportation needs:    Medical: Not on file    Non-medical: Not on file  Tobacco Use  . Smoking status: Never Smoker  . Smokeless tobacco: Never Used  Substance and Sexual Activity  . Alcohol use: No  . Drug use: No  . Sexual activity: Not on file  Lifestyle  . Physical activity:    Days per week: Not on file    Minutes per session: Not on file  . Stress: Not on file  Relationships  . Social connections:    Talks on phone: Not on file    Gets together: Not on file    Attends religious service: Not on file    Active member of club or organization: Not on file    Attends meetings of clubs or organizations: Not on file     Relationship status: Not on file  . Intimate partner violence:    Fear of current or ex partner: Not on file    Emotionally abused: Not on file    Physically abused: Not on file    Forced sexual activity: Not on file  Other Topics Concern  . Not on file  Social History Narrative  . Not on file    Review of Systems:    Constitutional: No weight loss, fever, or chills Cardiovascular: No chest pain Respiratory: No SOB  Gastrointestinal: See HPI and otherwise negative   Physical Exam:  Vital signs: BP 124/62   Pulse 78   Ht 6' (1.829 m)   Wt 262 lb 12.8 oz (119.2 kg)   SpO2 98%   BMI 35.64 kg/m   Constitutional:   Pleasant overweight Caucasian male appears to be in NAD, Well developed, Well nourished, alert and cooperative Respiratory: Respirations even and unlabored. Lungs clear to auscultation bilaterally.   No wheezes, crackles, or rhonchi.  Cardiovascular: Normal S1, S2. No MRG. Regular rate and rhythm. No peripheral edema, cyanosis or pallor.  Gastrointestinal:  Soft, nondistended, nontender. No rebound or guarding. Normal bowel sounds. No appreciable masses or hepatomegaly. Psychiatric:  Demonstrates good judgement and reason without abnormal affect or behaviors.  MOST RECENT LABS AND IMAGING: CBC    Component Value Date/Time   WBC 3.1 (L) 10/01/2017 0802   RBC 4.19 (L) 10/01/2017 0802   HGB 10.4 (L) 10/01/2017 0802   HCT 31.8 (L) 10/01/2017 0802   PLT 114 (L) 10/01/2017 0802   MCV 75.9 (L) 10/01/2017 0802   MCH 24.8 (L) 10/01/2017 0802   MCHC 32.7 10/01/2017 0802   RDW 13.9 10/01/2017 0802   LYMPHSABS 840 (L) 10/01/2017 0802   MONOABS 648 08/28/2016 0929   EOSABS 68 10/01/2017 0802   BASOSABS 19 10/01/2017 0802    CMP     Component Value Date/Time   NA 137 10/01/2017 0802   K 4.3 10/01/2017 0802   CL 106 10/01/2017 0802   CO2 23 10/01/2017 0802   GLUCOSE 133 (H) 10/01/2017 0802   BUN 13 10/01/2017 0802   CREATININE 0.73 10/01/2017 0802   CALCIUM 8.3  (L) 10/01/2017 0802   PROT 7.1 10/01/2017 0802   ALBUMIN 4.0 08/28/2016 0929   AST 35 10/01/2017 0802   ALT 36 10/01/2017 0802   ALKPHOS 83 08/28/2016 0929   BILITOT 0.6 10/01/2017 0802   GFRNONAA 95 03/29/2017 1602   GFRAA  110 03/29/2017 1602    Assessment: 1.  IDA: Recent hemoglobin 10.4 with low iron and ferritin, positive fecal hemoglobin, recent colonoscopy 07/2016 with adenomatous polyps and internal hemorrhoids; consider upper GI source of blood loss versus hematological source  Plan: 1.  Scheduled patient for an EGD in the [place] with Dr. Hilarie Fredrickson.  Did discuss risk, benefits, limitations and alternatives and patient agrees to proceed. 2.  Patient to continue iron and B12 as directed by his PCP 3.  Patient to follow in clinic per recommendations from Dr. Hilarie Fredrickson after time of procedure.  Ellouise Newer, PA-C [facility] 11/01/2017, 2:15 PM  Cc: Susy Frizzle, MD   Addendum: Reviewed and agree with initial management. In light of IDA, and the fair bowel preparation at time of colonoscopy 15 months ago, I would add colonoscopy with 2 day prep to the scheduled EGD.  If both negative, then VCE. Pyrtle, Lajuan Lines, MD

## 2017-11-01 NOTE — Patient Instructions (Signed)

## 2017-11-04 ENCOUNTER — Telehealth: Payer: Self-pay

## 2017-11-04 NOTE — Telephone Encounter (Signed)
The pt was scheduled for pre visit and colon was added to EGD and moved to 11/4.  The pt has been advised of the information and verbalized understanding.

## 2017-11-04 NOTE — Telephone Encounter (Signed)
Addendum: Reviewed and agree with initial management. In light of IDA, and the fair bowel preparation at time of colonoscopy 15 months ago, I would add colonoscopy with 2 day prep to the scheduled EGD.  If both negative, then VCE. Pyrtle, Lajuan Lines, MD

## 2017-11-04 NOTE — Progress Notes (Signed)
Pt saw Ellouise Newer PA.

## 2017-11-12 ENCOUNTER — Encounter: Payer: 59 | Admitting: Internal Medicine

## 2017-11-13 ENCOUNTER — Encounter: Payer: 59 | Admitting: Internal Medicine

## 2017-12-09 ENCOUNTER — Ambulatory Visit (AMBULATORY_SURGERY_CENTER): Payer: Self-pay

## 2017-12-09 VITALS — Ht 72.0 in | Wt 264.9 lb

## 2017-12-09 DIAGNOSIS — R195 Other fecal abnormalities: Secondary | ICD-10-CM

## 2017-12-09 MED ORDER — NA SULFATE-K SULFATE-MG SULF 17.5-3.13-1.6 GM/177ML PO SOLN: 1.0000 | Freq: Once | ORAL | 0 refills | Status: AC

## 2017-12-09 NOTE — Progress Notes (Signed)
Denies allergies to eggs or soy products. Denies complication of anesthesia or sedation. Denies use of weight loss medication. Denies use of O2.   Emmi instructions declined.  

## 2017-12-16 ENCOUNTER — Other Ambulatory Visit (INDEPENDENT_AMBULATORY_CARE_PROVIDER_SITE_OTHER): Payer: Medicare Other

## 2017-12-16 ENCOUNTER — Encounter: Payer: Self-pay | Admitting: Internal Medicine

## 2017-12-16 ENCOUNTER — Ambulatory Visit (AMBULATORY_SURGERY_CENTER): Payer: Medicare Other | Admitting: Internal Medicine

## 2017-12-16 VITALS — BP 112/63 | HR 71 | Temp 98.2°F | Resp 14 | Ht 72.0 in | Wt 264.0 lb

## 2017-12-16 DIAGNOSIS — K2980 Duodenitis without bleeding: Secondary | ICD-10-CM | POA: Diagnosis not present

## 2017-12-16 DIAGNOSIS — D508 Other iron deficiency anemias: Secondary | ICD-10-CM | POA: Diagnosis not present

## 2017-12-16 DIAGNOSIS — K3189 Other diseases of stomach and duodenum: Secondary | ICD-10-CM

## 2017-12-16 DIAGNOSIS — K2970 Gastritis, unspecified, without bleeding: Secondary | ICD-10-CM | POA: Diagnosis not present

## 2017-12-16 DIAGNOSIS — D124 Benign neoplasm of descending colon: Secondary | ICD-10-CM

## 2017-12-16 DIAGNOSIS — K2951 Unspecified chronic gastritis with bleeding: Secondary | ICD-10-CM | POA: Diagnosis not present

## 2017-12-16 DIAGNOSIS — K5520 Angiodysplasia of colon without hemorrhage: Secondary | ICD-10-CM | POA: Diagnosis not present

## 2017-12-16 DIAGNOSIS — K2981 Duodenitis with bleeding: Secondary | ICD-10-CM | POA: Diagnosis not present

## 2017-12-16 DIAGNOSIS — K2990 Gastroduodenitis, unspecified, without bleeding: Secondary | ICD-10-CM

## 2017-12-16 DIAGNOSIS — R195 Other fecal abnormalities: Secondary | ICD-10-CM

## 2017-12-16 DIAGNOSIS — D649 Anemia, unspecified: Secondary | ICD-10-CM | POA: Diagnosis not present

## 2017-12-16 HISTORY — PX: UPPER GASTROINTESTINAL ENDOSCOPY: SHX188

## 2017-12-16 LAB — CBC WITH DIFFERENTIAL/PLATELET
Basophils Absolute: 0 10*3/uL (ref 0.0–0.1)
Basophils Relative: 0.4 % (ref 0.0–3.0)
Eosinophils Absolute: 0 10*3/uL (ref 0.0–0.7)
Eosinophils Relative: 2.1 % (ref 0.0–5.0)
HCT: 31.5 % — ABNORMAL LOW (ref 39.0–52.0)
Hemoglobin: 10.4 g/dL — ABNORMAL LOW (ref 13.0–17.0)
Lymphocytes Relative: 28.4 % (ref 12.0–46.0)
Lymphs Abs: 0.6 10*3/uL — ABNORMAL LOW (ref 0.7–4.0)
MCHC: 33 g/dL (ref 30.0–36.0)
MCV: 73.8 fl — ABNORMAL LOW (ref 78.0–100.0)
Monocytes Absolute: 0.2 10*3/uL (ref 0.1–1.0)
Monocytes Relative: 11.1 % (ref 3.0–12.0)
Neutro Abs: 1.3 10*3/uL — ABNORMAL LOW (ref 1.4–7.7)
Neutrophils Relative %: 58 % (ref 43.0–77.0)
Platelets: 102 10*3/uL — ABNORMAL LOW (ref 150.0–400.0)
RBC: 4.26 Mil/uL (ref 4.22–5.81)
RDW: 16.7 % — ABNORMAL HIGH (ref 11.5–15.5)
WBC: 2.2 10*3/uL — ABNORMAL LOW (ref 4.0–10.5)

## 2017-12-16 LAB — FERRITIN: Ferritin: 15.8 ng/mL — ABNORMAL LOW (ref 22.0–322.0)

## 2017-12-16 LAB — IBC PANEL
Iron: 39 ug/dL — ABNORMAL LOW (ref 42–165)
Saturation Ratios: 6.5 % — ABNORMAL LOW (ref 20.0–50.0)
Transferrin: 428 mg/dL — ABNORMAL HIGH (ref 212.0–360.0)

## 2017-12-16 MED ORDER — SODIUM CHLORIDE 0.9 % IV SOLN: 500.0000 mL | Freq: Once | INTRAVENOUS | Status: DC

## 2017-12-16 MED ORDER — PANTOPRAZOLE SODIUM 40 MG PO TBEC: 40.0000 mg | DELAYED_RELEASE_TABLET | Freq: Every day | ORAL | 3 refills | Status: DC

## 2017-12-16 NOTE — Op Note (Signed)
[place] Patient Name: Joel Reed Procedure Date: 12/16/2017 7:27 AM MRN: [id number] Endoscopist: Jerene Bears , MD Age: 67 Referring MD:  Date of Birth: Sep 29, 1950 Gender: Male Account #: [id number] Procedure:                Colonoscopy Indications:              Positive fecal immunochemical test, Iron deficiency                            anemia, last colonoscopy 17 months ago with fair                            prep and removal of 4 subcentimeter adenomas Medicines:                Monitored Anesthesia Care Procedure:                Pre-Anesthesia Assessment:                           - Prior to the procedure, a History and Physical                            was performed, and patient medications and                            allergies were reviewed. The patient's tolerance of                            previous anesthesia was also reviewed. The risks                            and benefits of the procedure and the sedation                            options and risks were discussed with the patient.                            All questions were answered, and informed consent                            was obtained. Prior Anticoagulants: The patient has                            taken no previous anticoagulant or antiplatelet                            agents. ASA Grade Assessment: II - A patient with                            mild systemic disease. After reviewing the risks                            and benefits, the patient was deemed in  satisfactory condition to undergo the procedure.                           After obtaining informed consent, the colonoscope                            was passed under direct vision. Throughout the                            procedure, the patient's blood pressure, pulse, and                            oxygen saturations were monitored continuously. The                            Colonoscope was  introduced through the anus and                            advanced to the terminal ileum. The colonoscopy was                            performed without difficulty. The patient tolerated                            the procedure well. The quality of the bowel                            preparation was good. The terminal ileum, ileocecal                            valve, appendiceal orifice, and rectum were                            photographed. The bowel preparation used was 2 day                            prep (Suprep + MiraLax). Scope In: 8:26:38 AM Scope Out: 8:40:44 AM Scope Withdrawal Time: 0 hours 11 minutes 53 seconds  Total Procedure Duration: 0 hours 14 minutes 6 seconds  Findings:                 The digital rectal exam was normal.                           The terminal ileum appeared normal.                           Multiple small and medium angioectasias without                            bleeding were found in the transverse colon and in                            the ascending colon. These lesions were scattered  and not actively bleeding. They were more fan-like                            and faint red (as opposed to tight cherry red                            lesions).                           A 2 mm polyp was found in the descending colon. The                            polyp was sessile. The polyp was removed with a                            cold snare. Resection and retrieval were complete.                           Internal hemorrhoids were found during                            retroflexion. The hemorrhoids were small. Complications:            No immediate complications. Estimated Blood Loss:     Estimated blood loss was minimal. Impression:               - The examined portion of the ileum was normal.                           - Multiple non-bleeding colonic angioectasias.                           - One 2 mm polyp in the  descending colon, removed                            with a cold snare. Resected and retrieved.                           - Small internal hemorrhoids. Recommendation:           - Patient has a contact number available for                            emergencies. The signs and symptoms of potential                            delayed complications were discussed with the                            patient. Return to normal activities tomorrow.                            Written discharge instructions were provided to the  patient.                           - Resume previous diet.                           - Continue present medications.                           - Await pathology results.                           - Repeat colonoscopy is recommended for                            surveillance. The colonoscopy date will be                            determined after pathology results from today's                            exam become available for review.                           - Continue oral iron supplementation.                           - Avoid NSAIDs.                           - Repeat iron studies to ensure improvement in iron                            deficiency, if persistent then consider IV iron. Jerene Bears, MD 12/16/2017 9:03:18 AM This report has been signed electronically.

## 2017-12-16 NOTE — Progress Notes (Signed)
Pt's states no medical or surgical changes since previsit or office visit. 

## 2017-12-16 NOTE — Patient Instructions (Signed)
YOU NEED TO STOP AT THE LAB PRIOR TO DC.  AVOID NSAIDS (ASPIRIN, IBUPROFEN, ALEVE, NAPROXEN)  STOP AT YOUR PHARMACY TO PICK UP NEW STOMACH MEDICINE  HANDOUTS GIVEN FOR POLYPS, HEMORRHOIDS AND GASTRITIS  YOU HAD AN ENDOSCOPIC PROCEDURE TODAY AT [facility]:   Refer to the procedure report that was given to you for any specific questions about what was found during the examination.  If the procedure report does not answer your questions, please call your gastroenterologist to clarify.  If you requested that your care partner not be given the details of your procedure findings, then the procedure report has been included in a sealed envelope for you to review at your convenience later.  YOU SHOULD EXPECT: Some feelings of bloating in the abdomen. Passage of more gas than usual.  Walking can help get rid of the air that was put into your GI tract during the procedure and reduce the bloating. If you had a lower endoscopy (such as a colonoscopy or flexible sigmoidoscopy) you may notice spotting of blood in your stool or on the toilet paper. If you underwent a bowel prep for your procedure, you may not have a normal bowel movement for a few days.  Please Note:  You might notice some irritation and congestion in your nose or some drainage.  This is from the oxygen used during your procedure.  There is no need for concern and it should clear up in a day or so.  SYMPTOMS TO REPORT IMMEDIATELY:   Following lower endoscopy (colonoscopy or flexible sigmoidoscopy):  Excessive amounts of blood in the stool  Significant tenderness or worsening of abdominal pains  Swelling of the abdomen that is new, acute  Fever of 100F or higher   Following upper endoscopy (EGD)  Vomiting of blood or coffee ground material  New chest pain or pain under the shoulder blades  Painful or persistently difficult swallowing  New shortness of breath  Fever of 100F or higher  Black, tarry-looking  stools  For urgent or emergent issues, a gastroenterologist can be reached at any hour by calling [phone].   DIET:  We do recommend a small meal at first, but then you may proceed to your regular diet.  Drink plenty of fluids but you should avoid alcoholic beverages for 24 hours.  ACTIVITY:  You should plan to take it easy for the rest of today and you should NOT DRIVE or use heavy machinery until tomorrow (because of the sedation medicines used during the test).    FOLLOW UP: Our staff will call the number listed on your records the next business day following your procedure to check on you and address any questions or concerns that you may have regarding the information given to you following your procedure. If we do not reach you, we will leave a message.  However, if you are feeling well and you are not experiencing any problems, there is no need to return our call.  We will assume that you have returned to your regular daily activities without incident.  If any biopsies were taken you will be contacted by phone or by letter within the next 1-3 weeks.  Please call us at [phone] if you have not heard about the biopsies in 3 weeks.    SIGNATURES/CONFIDENTIALITY: You and/or your care partner have signed paperwork which will be entered into your electronic medical record.  These signatures attest to the fact that that the information above on your  After Visit Summary has been reviewed and is understood.  Full responsibility of the confidentiality of this discharge information lies with you and/or your care-partner.

## 2017-12-16 NOTE — Progress Notes (Signed)
Called to room to assist during endoscopic procedure.  Patient ID and intended procedure confirmed with present staff. Received instructions for my participation in the procedure from the performing physician.  

## 2017-12-16 NOTE — Progress Notes (Signed)
Alert and oriented x 3, pleased with MAC, report to RN

## 2017-12-16 NOTE — Op Note (Signed)
[place] Patient Name: Joel Reed Procedure Date: 12/16/2017 7:35 AM MRN: [id number] Endoscopist: Jerene Bears , MD Age: 67 Referring MD:  Date of Birth: [birth year] Gender: Male Account #: [id number] Procedure:                Upper GI endoscopy Indications:              Iron deficiency anemia, Heme positive stool Medicines:                Monitored Anesthesia Care Procedure:                Pre-Anesthesia Assessment:                           - Prior to the procedure, a History and Physical                            was performed, and patient medications and                            allergies were reviewed. The patient's tolerance of                            previous anesthesia was also reviewed. The risks                            and benefits of the procedure and the sedation                            options and risks were discussed with the patient.                            All questions were answered, and informed consent                            was obtained. Prior Anticoagulants: The patient has                            taken no previous anticoagulant or antiplatelet                            agents. ASA Grade Assessment: II - A patient with                            mild systemic disease. After reviewing the risks                            and benefits, the patient was deemed in                            satisfactory condition to undergo the procedure.                           After obtaining informed consent, the endoscope was  passed under direct vision. Throughout the                            procedure, the patient's blood pressure, pulse, and                            oxygen saturations were monitored continuously. The                            Endoscope was introduced through the mouth, and                            advanced to the second part of duodenum. The upper                            GI endoscopy was  accomplished without difficulty.                            The patient tolerated the procedure well. Scope In: Scope Out: Findings:                 The examined esophagus was normal.                           Diffuse moderate inflammation characterized by                            congestion (edema), erosions and erythema was found                            in the gastric body and in the gastric antrum.                            Biopsies were taken with a cold forceps for                            histology and Helicobacter pylori testing (gastric                            body, antrum, and incisura).                           Multiple diffuse erosions were found in the                            duodenal bulb and in the second portion of the                            duodenum. Biopsies for histology were taken with a                            cold forceps for evaluation of celiac disease. The  mucosa in the entire examined duodenum was                            flattened with loss of normal appearing villous                            architecture (given erosions this may be more                            peptic in nature than that of celiac disease). Complications:            No immediate complications. Estimated Blood Loss:     Estimated blood loss was minimal. Impression:               - Normal esophagus.                           - Gastritis. Biopsied.                           - Duodenitis with erosions. Biopsied. Recommendation:           - Patient has a contact number available for                            emergencies. The signs and symptoms of potential                            delayed complications were discussed with the                            patient. Return to normal activities tomorrow.                            Written discharge instructions were provided to the                            patient.                           -  Resume previous diet.                           - Continue present medications.                           - Await pathology results.                           - Avoid NSAIDs.                           - Begin pantoprazole 40 mg once daily.                           - See colonoscopy report. Jerene Bears, MD 12/16/2017 8:55:31 AM This report has been signed electronically.

## 2017-12-17 ENCOUNTER — Encounter: Payer: Self-pay | Admitting: Family Medicine

## 2017-12-17 ENCOUNTER — Telehealth: Payer: Self-pay

## 2017-12-17 DIAGNOSIS — K2990 Gastroduodenitis, unspecified, without bleeding: Secondary | ICD-10-CM | POA: Insufficient documentation

## 2017-12-17 NOTE — Telephone Encounter (Signed)
  Follow up Call-  Call back number 12/16/2017 08/02/2016  Post procedure Call Back phone  # [phone]  Permission to leave phone message Yes Yes  Some recent data might be hidden     Patient questions:  Do you have a fever, pain , or abdominal swelling? No. Pain Score  0 *  Have you tolerated food without any problems? Yes.    Have you been able to return to your normal activities? Yes.    Do you have any questions about your discharge instructions: Diet   No. Medications  No. Follow up visit  No.  Do you have questions or concerns about your Care? No.  Actions: * If pain score is 4 or above: No action needed, pain <4.

## 2017-12-20 ENCOUNTER — Other Ambulatory Visit: Payer: Self-pay

## 2017-12-20 DIAGNOSIS — D509 Iron deficiency anemia, unspecified: Secondary | ICD-10-CM

## 2017-12-20 DIAGNOSIS — D61818 Other pancytopenia: Secondary | ICD-10-CM

## 2017-12-22 ENCOUNTER — Other Ambulatory Visit: Payer: Self-pay | Admitting: Family Medicine

## 2017-12-23 ENCOUNTER — Encounter (HOSPITAL_COMMUNITY): Payer: Medicare Other

## 2017-12-25 ENCOUNTER — Ambulatory Visit (HOSPITAL_COMMUNITY)
Admission: RE | Admit: 2017-12-25 | Discharge: 2017-12-25 | Disposition: A | Discharge status: Home / Self Care | Payer: Medicare Other | Source: Ambulatory Visit | Attending: Internal Medicine | Admitting: Internal Medicine

## 2017-12-25 ENCOUNTER — Other Ambulatory Visit: Payer: Self-pay

## 2017-12-25 ENCOUNTER — Telehealth: Payer: Self-pay | Admitting: Internal Medicine

## 2017-12-25 DIAGNOSIS — D509 Iron deficiency anemia, unspecified: Secondary | ICD-10-CM | POA: Insufficient documentation

## 2017-12-25 MED ORDER — SODIUM CHLORIDE 0.9 % IV SOLN
510.0000 mg | INTRAVENOUS | Status: DC
  Administered 2017-12-25: 510 mg via INTRAVENOUS
  Filled 2017-12-25: qty 17

## 2017-12-25 MED ORDER — SODIUM CHLORIDE 0.9 % IV SOLN
INTRAVENOUS | Status: DC | PRN
  Administered 2017-12-25: 250 mL via INTRAVENOUS

## 2017-12-25 NOTE — Progress Notes (Signed)
[facility]   Diagnosis: Iron deficiency anemia  Provider: Dr. Hilarie Fredrickson  Procedure: Feraheme infusion  Note:  Patient received IV Feraheme. Monitored 30 minutes post infusion, tolerated the procedure, no adverse reaction noted. Discharge instructions given, verbalized understanding. Patient alert and oriented x 4. Ambulatory at discharge. Stable.

## 2017-12-25 NOTE — Discharge Instructions (Signed)

## 2017-12-25 NOTE — Telephone Encounter (Signed)
Pt care center calling for orders on pt. Pt is to receive Feraheme infusions x2, orders in epic.

## 2017-12-27 DIAGNOSIS — L03115 Cellulitis of right lower limb: Secondary | ICD-10-CM | POA: Diagnosis not present

## 2017-12-30 ENCOUNTER — Encounter (HOSPITAL_COMMUNITY): Payer: Medicare Other

## 2017-12-31 ENCOUNTER — Ambulatory Visit (INDEPENDENT_AMBULATORY_CARE_PROVIDER_SITE_OTHER): Payer: Medicare Other | Admitting: Family Medicine

## 2017-12-31 ENCOUNTER — Encounter: Payer: Self-pay | Admitting: Family Medicine

## 2017-12-31 VITALS — BP 130/68 | HR 74 | Temp 98.2°F | Resp 18 | Ht 72.0 in | Wt 266.0 lb

## 2017-12-31 DIAGNOSIS — R600 Localized edema: Secondary | ICD-10-CM | POA: Diagnosis not present

## 2017-12-31 DIAGNOSIS — D61818 Other pancytopenia: Secondary | ICD-10-CM | POA: Diagnosis not present

## 2017-12-31 DIAGNOSIS — D509 Iron deficiency anemia, unspecified: Secondary | ICD-10-CM | POA: Diagnosis not present

## 2017-12-31 DIAGNOSIS — R0989 Other specified symptoms and signs involving the circulatory and respiratory systems: Secondary | ICD-10-CM | POA: Diagnosis not present

## 2017-12-31 NOTE — Progress Notes (Signed)
Subjective:    Patient ID: Joel Gums., male    DOB: [birth year], 67 y.o.   MRN: [id number]  Medication Refill     08/28/16 Thursday night, the patient had shrimp at a business meeting. Shortly thereafter he developed watery diarrhea. Watery diarrhea persisted for 24-48 hours and then subsided. He denies any nausea or vomiting. However over the entire weekend, he has had near constant pain in his right upper quadrant. He is tender to palpation on the lower edge of the liver. There is no obvious hepatomegaly. He denies any constipation. He denies any diarrhea. He denies any melena or hematochezia. He denies any vomiting. He denies any pain with food. He denies any fevers or chills. Pain is made worse when he is sitting still or with movement. He also reports pain with deep inspiration. He denies any cough or shortness of breath.  At that time, my plan was: Check CBC to evaluate for leukocytosis, CMP to check liver function test. I will check a acute hepatitis panel to rule out hepatitis A. I will also obtain a right upper quadrant ultrasound given the pain in the area to rule out cholelithiasis, cholecystitis. My suspicion is that he has food poisoning and with pain in the right upper quadrant is musculoskeletal in nature. Therefore if right upper quadrant ultrasound is normal, lab work is normal, I will treat the patient with medication to ease the pain. Await the results of the right upper quadrant ultrasound today  09/06/16 Right upper quadrant ultrasound revealed no cause of the patient's pain. CT scan revealed cirrhosis with mild splenomegaly but otherwise revealed no cause for his pain. Chest x-ray was clear. Patient states that his pain is gradually improving. It is now brought on only by movement. If he twists or turns the pain worse on the right side. He takes a deep breath in the pain will worsen. It sounds musculoskeletal/abdominal wall related. He denies any fever. He denies any nausea  vomiting or diarrhea. He is here today primarily to discuss the cirrhosis.  At that time, my plan was:  I believe the pain he was having in his right side is musculoskeletal. It is slowly improving and we decided to watch it for the next 2 weeks and allow her to gradually improve. We spent 20 minutes today discussing his cirrhosis. I recommended 30-50 pounds weight loss, a 1500-calorie a day diet, a vegetarian diet, switching glipizide to victoza for weight loss.  Patient would also like to touch base again with his hepatologist at [facility]. In the past he had a liver biopsy to confirm fatty liver disease. However he never has cirrhosis until now. I will schedule this as well   03/29/17 Patient is here today for follow-up.  His weight is essentially unchanged since his visit last year.  Patient has cirrhosis secondary to fatty liver disease.  He admits that he is not exercising.  He is eating a low carbohydrate low saturated fat diet.  He denies any polyuria, polydipsia, or blurry vision.  He denies any chest pain or shortness of breath.  He denies any myalgias or right upper quadrant pain.  He has significant edema in both lower extremities with tight firm fibrotic skin at the ankles.  Please see the findings on diabetic foot exam.  Patient has chronic venous stasis changes in both lower extremities.  He denies any neuropathy in his feet however he does have onychomycosis on his toenails.  He is due for Pneumovax 23.  He is already had his flu shot.  At that time, my plan was: Given the diminished pulses in both feet, I will send the patient for an ultrasound with ankle-brachial indexes to evaluate for peripheral vascular disease.  Regarding his cirrhosis I encourage the patient to exercise 30 minutes a day 5 days a week and try to lose 30-40 pounds.  His blood pressure today is well controlled.  I will check a fasting lipid panel.  Goal LDL cholesterol is less than 100.  I will also check a hemoglobin A1c.  Goal  hemoglobin A1c is less than 6.5.  May want to consider switching the patient to Victoza for weight loss.  Check urine microalbumin as well.  Patient received Pneumovax 23 today in clinic.  10/10/17 Patient is here today for follow-up.  His most recent lab work is listed below.  His hemoglobin A1c has fallen from 6.3-6.0!.  I am very proud of that.  He is having occasional episodes of hypoglycemia.  He is still taking Amaryl 4 mg a day in addition to his metformin.  I have recommended decreasing the dose of Amaryl to 2 mg a day to avoid hypoglycemia.  He denies any polyuria, polydipsia, or blurry vision.  His immunizations are up-to-date except for seasonal flu shot.  His blood pressure today is significantly elevated at 152/74.  He is not checking his blood pressure at home but he states that he is never seen his blood pressure that high.  He is not experiencing any chest pain shortness of breath or dyspnea on exertion.  His liver function test are normal!.  He denies any myalgias or right upper quadrant pain.  His cholesterol is excellent and his LDL cholesterol is well below 100.  He continues to have low HDL cholesterol and he admits that he is not getting any aerobic exercise.  Unfortunately there is been a significant drop in his white blood cell count, his hemoglobin, and his platelet count.  Past medical history is significant for cirrhosis suspected due to Barrett.  He denies any melena or hematochezia.  At that time, my plan was: I am concerned by his pancytopenia.  I will check an iron level, ferritin, and vitamin B12 to evaluate for any vitamin or mineral deficiencies.  I have recommended stool cards to rule out an occult GI bleed.  If positive, I would recommend an EGD to evaluate for esophageal varices given his history of cirrhosis.  I am concerned by his blood pressure and I recommended that he buy blood pressure cuff and check it every day and notify me of the values in 1 week so that we can adjust  his blood pressure medicine further.  Diabetes test is excellent.  I would decrease Amaryl to 2 mg a day and recheck fasting lab work in 3 months.  Cholesterol is excellent I have encouraged him exercise 30 minutes a day 5 days a week to address his dyslipidemia.  If there is no evidence of a GI bleed and his vitamin and mineral levels are normal, I would recommend an oncology consultation for possible myelofibrosis, etc. however it is possible that his pancytopenia is secondary to his cirrhosis  12/31/17 Stool cards did reveal blood.  Therefore we consulted GI.  Patient had an EGD performed on November 4 which showed gastritis and duodenitis although there was no active source of bleeding.  He also had a colonoscopy that revealed several nonbleeding angiodysplasias.  It also showed a simple polyp.  Dr. Hilarie Fredrickson performed a CBC which again showed pancytopenia with a white blood cell count of 2.2, hemoglobin remained just above 10, platelet count was slightly low.  Ferritin and iron levels are extremely low.  Percent saturation was also low suggesting iron deficiency anemia despite being on oral replacement.  Patient is here today for follow-up.  Patient has already received Feraheme.  He states that he feels much better after having received iron infusion.  He is also been scheduled to follow-up with heme-onc regarding his pancytopenia.  However on his exam today, the patient has a massively enlarged right lower extremity compared to the left lower extremity.  He states that this occurred at the end of last week.  He was in urgent care and was started on antibiotics for possible cellulitis, Keflex.  However the situation has not improved.  On exam, the right lower extremity distal to the knee is much larger than the left lower extremity.  He has +1 pitting edema in the right lower extremity.  It is warm to the touch.  It is slightly erythematous but does not appear to be cellulitic.  Instead it is inflamed and  hot.  He also complains of pain posteriorly in his calf raising the concern for a DVT.  Past Medical History:  Diagnosis Date  . Allergy   . Diabetes mellitus without complication ([place])   . Gastritis and duodenitis    on egd 12/2017  . Hyperlipidemia    Patient denies  . Hypertension   . Nephrolithiasis   . 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  . Sleep apnea    Past Surgical History:  Procedure Laterality Date  . COLONOSCOPY    . JOINT REPLACEMENT     left hip Dr Noemi Chapel  . SPINE SURGERY     disectomy   Current Outpatient Medications on File Prior to Visit  Medication Sig Dispense Refill  . amLODipine (NORVASC) 5 MG tablet TAKE 1 TABLET BY MOUTH ONCE DAILY 90 tablet 2  . aspirin EC 81 MG tablet Take 81 mg by mouth daily.    . Blood Glucose Monitoring Suppl (CONTOUR NEXT MONITOR) w/Device KIT 1 strip by Does not apply route daily. 1 kit 0  . ferrous sulfate 325 (65 FE) MG EC tablet Take 325 mg by mouth daily.    Marland Kitchen glimepiride (AMARYL) 4 MG tablet TAKE 1 TABLET BY MOUTH EVERY DAY 90 tablet 0  . glucose blood (CONTOUR NEXT TEST) test strip Check bs daily DX: E11.9 100 each 12  . Lancets Misc. MISC Use with daily bs check 100 each 3  . losartan (COZAAR) 100 MG tablet TAKE 1 TABLET BY MOUTH EVERY DAY 90 tablet 0  . metFORMIN (GLUCOPHAGE) 500 MG tablet TAKE 1 TABLET BY MOUTH 2 TIMES DAILY WITH A MEAL. 180 tablet 0  . metoprolol succinate (TOPROL-XL) 25 MG 24 hr tablet TAKE 1 TABLET BY MOUTH EVERY DAY 90 tablet 0  . OVER THE COUNTER MEDICATION B 12 , one tablet daily.    Marland Kitchen OVER THE COUNTER MEDICATION Vitamin D 3, 1200 mg. One time daily.    . pantoprazole (PROTONIX) 40 MG tablet Take 1 tablet (40 mg total) by mouth daily. 90 tablet 3   No current facility-administered medications on file prior to visit.    No Known Allergies Social History   Socioeconomic History  . Marital status: Married    Spouse name: Not on file  . Number of children:  Not on file  . Years of education: Not on file  . Highest education level: Not on file  Occupational History  . Not on file  Social Needs  . Financial resource strain: Not on file  . Food insecurity:    Worry: Not on file    Inability: Not on file  . Transportation needs:    Medical: Not on file    Non-medical: Not on file  Tobacco Use  . Smoking status: Never Smoker  . Smokeless tobacco: Never Used  Substance and Sexual Activity  . Alcohol use: No  . Drug use: No  . Sexual activity: Not on file  Lifestyle  . Physical activity:    Days per week: Not on file    Minutes per session: Not on file  . Stress: Not on file  Relationships  . Social connections:    Talks on phone: Not on file    Gets together: Not on file    Attends religious service: Not on file    Active member of club or organization: Not on file    Attends meetings of clubs or organizations: Not on file    Relationship status: Not on file  . Intimate partner violence:    Fear of current or ex partner: Not on file    Emotionally abused: Not on file    Physically abused: Not on file    Forced sexual activity: Not on file  Other Topics Concern  . Not on file  Social History Narrative  . Not on file      Review of Systems  All other systems reviewed and are negative.      Objective:   Physical Exam  Constitutional: He appears well-developed and well-nourished. No distress.  Neck: No JVD present.  Cardiovascular: Normal rate, regular rhythm and normal heart sounds. Exam reveals no gallop and no friction rub.  No murmur heard. Pulmonary/Chest: Effort normal and breath sounds normal. No respiratory distress. He has no wheezes. He has no rales.  Abdominal: Soft. Bowel sounds are normal. He exhibits no distension and no mass. There is no tenderness. There is no rebound and no guarding.  Musculoskeletal: He exhibits edema.       Right lower leg: He exhibits tenderness, swelling and edema.  Skin: He is not  diaphoretic.  Vitals reviewed.    Asymmetric edema in the right leg distal to the knee with warmth and erythema but does not appear to be cellulitis.     Assessment & Plan:  Leg edema, right - Plan: VAS [place] LOWER EXTREMITY VENOUS (DVT), VAS [place] LOWER EXTREMITY VENOUS (DVT), CANCELED: DOPPLER VENOUS LEGS BILATERAL  Microcytic anemia  Decreased pedal pulses  Pancytopenia ([facility])  First I am concerned about a DVT.  I believe the pretest probability is extremely high.  Therefore I will start the patient on Xarelto 15 mg p.o. twice daily given the late hour of this office visit and schedule him for an urgent ultrasound of the lower extremities to rule out DVT in the morning.  If DVT is confirmed, patient will continue this medication and we will discuss long-term treatment option.  If ultrasound ruled out DVT, we may need to consider lymphedema versus atypical cellulitis.  Regarding his microcytic anemia, the patient has iron deficiency anemia and does not appear to absorb iron well from his diet.  He is improved dramatically after receiving an iron infusion.  However given his pancytopenia, I have recommended a consultation with hematology/oncology to rule out  underlying bone marrow abnormalities.  This may be due to his cirrhosis however I believe we need to rule out myelodysplasia, etc.  Patient has already called and made this appointment and I have encouraged him to keep it.  Follow-up on Friday, await the results of the lower extremity DVT evaluation.

## 2018-01-01 ENCOUNTER — Ambulatory Visit (HOSPITAL_COMMUNITY)
Admission: RE | Admit: 2018-01-01 | Discharge: 2018-01-01 | Disposition: A | Discharge status: Home / Self Care | Payer: Medicare Other | Source: Ambulatory Visit | Attending: Family Medicine | Admitting: Family Medicine

## 2018-01-01 DIAGNOSIS — R600 Localized edema: Secondary | ICD-10-CM

## 2018-01-01 NOTE — Progress Notes (Signed)
Preliminary notes--Right lower extremity venous duplex exam completed.  Findings consistent with age indeterminate deep vein thrombosis involving the right posterior tibial vein, and right peroneal vein.    3.06x0.92x2.5cm lymph node seen at right groin area.  Result attempted call multiple times to ordering physician's office, could not get hold with. Will e-fax to Dr. Dennard Schaumann.  Kevork Joyce H Magdalynn Davilla(RDMS RVT) 01/01/18 10:51 AM

## 2018-01-01 NOTE — Progress Notes (Signed)
Has dvt,  Already on xarelto 15 mg pobid.  My nurse has notified patient.  Will follow up in office on Friday

## 2018-01-03 ENCOUNTER — Ambulatory Visit (INDEPENDENT_AMBULATORY_CARE_PROVIDER_SITE_OTHER): Payer: Medicare Other | Admitting: Family Medicine

## 2018-01-03 ENCOUNTER — Ambulatory Visit (HOSPITAL_COMMUNITY)
Admission: RE | Admit: 2018-01-03 | Discharge: 2018-01-03 | Disposition: A | Discharge status: Home / Self Care | Payer: Medicare Other | Source: Ambulatory Visit | Attending: Internal Medicine | Admitting: Internal Medicine

## 2018-01-03 ENCOUNTER — Ambulatory Visit: Payer: Medicare Other | Admitting: Family Medicine

## 2018-01-03 VITALS — BP 144/76 | HR 80 | Temp 97.3°F | Wt 262.0 lb

## 2018-01-03 DIAGNOSIS — I82451 Acute embolism and thrombosis of right peroneal vein: Secondary | ICD-10-CM

## 2018-01-03 DIAGNOSIS — K921 Melena: Secondary | ICD-10-CM

## 2018-01-03 DIAGNOSIS — D509 Iron deficiency anemia, unspecified: Secondary | ICD-10-CM

## 2018-01-03 DIAGNOSIS — D61818 Other pancytopenia: Secondary | ICD-10-CM | POA: Diagnosis not present

## 2018-01-03 LAB — CBC WITH DIFFERENTIAL/PLATELET
Basophils Absolute: 22 cells/uL (ref 0–200)
Basophils Relative: 0.6 %
Eosinophils Absolute: 90 cells/uL (ref 15–500)
Eosinophils Relative: 2.5 %
HCT: 35.9 % — ABNORMAL LOW (ref 38.5–50.0)
Hemoglobin: 11.7 g/dL — ABNORMAL LOW (ref 13.2–17.1)
Lymphs Abs: 1037 cells/uL (ref 850–3900)
MCH: 25.3 pg — ABNORMAL LOW (ref 27.0–33.0)
MCHC: 32.6 g/dL (ref 32.0–36.0)
MCV: 77.7 fL — ABNORMAL LOW (ref 80.0–100.0)
MPV: 12 fL (ref 7.5–12.5)
Monocytes Relative: 11.5 %
Neutro Abs: 2038 cells/uL (ref 1500–7800)
Neutrophils Relative %: 56.6 %
Platelets: 135 10*3/uL — ABNORMAL LOW (ref 140–400)
RBC: 4.62 10*6/uL (ref 4.20–5.80)
RDW: 17 % — ABNORMAL HIGH (ref 11.0–15.0)
Total Lymphocyte: 28.8 %
WBC mixed population: 414 cells/uL (ref 200–950)
WBC: 3.6 10*3/uL — ABNORMAL LOW (ref 3.8–10.8)

## 2018-01-03 MED ORDER — RIVAROXABAN 20 MG PO TABS: 20.0000 mg | ORAL_TABLET | Freq: Every day | ORAL | 4 refills | Status: DC

## 2018-01-03 MED ORDER — SODIUM CHLORIDE 0.9 % IV SOLN
INTRAVENOUS | Status: DC | PRN
  Administered 2018-01-03: 250 mL via INTRAVENOUS

## 2018-01-03 MED ORDER — SODIUM CHLORIDE 0.9 % IV SOLN
510.0000 mg | Freq: Once | INTRAVENOUS | Status: AC
  Administered 2018-01-03: 510 mg via INTRAVENOUS
  Filled 2018-01-03: qty 17

## 2018-01-03 NOTE — Progress Notes (Signed)
Subjective:    Patient ID: Joel Gums., male    DOB: [birth year], 67 y.o.   MRN: [id number]  Medication Refill     08/28/16 Thursday night, the patient had shrimp at a business meeting. Shortly thereafter he developed watery diarrhea. Watery diarrhea persisted for 24-48 hours and then subsided. He denies any nausea or vomiting. However over the entire weekend, he has had near constant pain in his right upper quadrant. He is tender to palpation on the lower edge of the liver. There is no obvious hepatomegaly. He denies any constipation. He denies any diarrhea. He denies any melena or hematochezia. He denies any vomiting. He denies any pain with food. He denies any fevers or chills. Pain is made worse when he is sitting still or with movement. He also reports pain with deep inspiration. He denies any cough or shortness of breath.  At that time, my plan was: Check CBC to evaluate for leukocytosis, CMP to check liver function test. I will check a acute hepatitis panel to rule out hepatitis A. I will also obtain a right upper quadrant ultrasound given the pain in the area to rule out cholelithiasis, cholecystitis. My suspicion is that he has food poisoning and with pain in the right upper quadrant is musculoskeletal in nature. Therefore if right upper quadrant ultrasound is normal, lab work is normal, I will treat the patient with medication to ease the pain. Await the results of the right upper quadrant ultrasound today  09/06/16 Right upper quadrant ultrasound revealed no cause of the patient's pain. CT scan revealed cirrhosis with mild splenomegaly but otherwise revealed no cause for his pain. Chest x-ray was clear. Patient states that his pain is gradually improving. It is now brought on only by movement. If he twists or turns the pain worse on the right side. He takes a deep breath in the pain will worsen. It sounds musculoskeletal/abdominal wall related. He denies any fever. He denies any nausea  vomiting or diarrhea. He is here today primarily to discuss the cirrhosis.  At that time, my plan was:  I believe the pain he was having in his right side is musculoskeletal. It is slowly improving and we decided to watch it for the next 2 weeks and allow her to gradually improve. We spent 20 minutes today discussing his cirrhosis. I recommended 30-50 pounds weight loss, a 1500-calorie a day diet, a vegetarian diet, switching glipizide to victoza for weight loss.  Patient would also like to touch base again with his hepatologist at [facility]. In the past he had a liver biopsy to confirm fatty liver disease. However he never has cirrhosis until now. I will schedule this as well   03/29/17 Patient is here today for follow-up.  His weight is essentially unchanged since his visit last year.  Patient has cirrhosis secondary to fatty liver disease.  He admits that he is not exercising.  He is eating a low carbohydrate low saturated fat diet.  He denies any polyuria, polydipsia, or blurry vision.  He denies any chest pain or shortness of breath.  He denies any myalgias or right upper quadrant pain.  He has significant edema in both lower extremities with tight firm fibrotic skin at the ankles.  Please see the findings on diabetic foot exam.  Patient has chronic venous stasis changes in both lower extremities.  He denies any neuropathy in his feet however he does have onychomycosis on his toenails.  He is due for Pneumovax 23.  He is already had his flu shot.  At that time, my plan was: Given the diminished pulses in both feet, I will send the patient for an ultrasound with ankle-brachial indexes to evaluate for peripheral vascular disease.  Regarding his cirrhosis I encourage the patient to exercise 30 minutes a day 5 days a week and try to lose 30-40 pounds.  His blood pressure today is well controlled.  I will check a fasting lipid panel.  Goal LDL cholesterol is less than 100.  I will also check a hemoglobin A1c.  Goal  hemoglobin A1c is less than 6.5.  May want to consider switching the patient to Victoza for weight loss.  Check urine microalbumin as well.  Patient received Pneumovax 23 today in clinic.  10/10/17 Patient is here today for follow-up.  His most recent lab work is listed below.  His hemoglobin A1c has fallen from 6.3-6.0!.  I am very proud of that.  He is having occasional episodes of hypoglycemia.  He is still taking Amaryl 4 mg a day in addition to his metformin.  I have recommended decreasing the dose of Amaryl to 2 mg a day to avoid hypoglycemia.  He denies any polyuria, polydipsia, or blurry vision.  His immunizations are up-to-date except for seasonal flu shot.  His blood pressure today is significantly elevated at 152/74.  He is not checking his blood pressure at home but he states that he is never seen his blood pressure that high.  He is not experiencing any chest pain shortness of breath or dyspnea on exertion.  His liver function test are normal!.  He denies any myalgias or right upper quadrant pain.  His cholesterol is excellent and his LDL cholesterol is well below 100.  He continues to have low HDL cholesterol and he admits that he is not getting any aerobic exercise.  Unfortunately there is been a significant drop in his white blood cell count, his hemoglobin, and his platelet count.  Past medical history is significant for cirrhosis suspected due to [place].  He denies any melena or hematochezia.  At that time, my plan was: I am concerned by his pancytopenia.  I will check an iron level, ferritin, and vitamin B12 to evaluate for any vitamin or mineral deficiencies.  I have recommended stool cards to rule out an occult GI bleed.  If positive, I would recommend an EGD to evaluate for esophageal varices given his history of cirrhosis.  I am concerned by his blood pressure and I recommended that he buy blood pressure cuff and check it every day and notify me of the values in 1 week so that we can adjust  his blood pressure medicine further.  Diabetes test is excellent.  I would decrease Amaryl to 2 mg a day and recheck fasting lab work in 3 months.  Cholesterol is excellent I have encouraged him exercise 30 minutes a day 5 days a week to address his dyslipidemia.  If there is no evidence of a GI bleed and his vitamin and mineral levels are normal, I would recommend an oncology consultation for possible myelofibrosis, etc. however it is possible that his pancytopenia is secondary to his cirrhosis  12/31/17 Stool cards did reveal blood.  Therefore we consulted GI.  Patient had an EGD performed on November 4 which showed gastritis and duodenitis although there was no active source of bleeding.  He also had a colonoscopy that revealed several nonbleeding angiodysplasias.  It also showed a simple polyp.  Dr. Hilarie Fredrickson performed a CBC which again showed pancytopenia with a white blood cell count of 2.2, hemoglobin remained just above 10, platelet count was slightly low.  Ferritin and iron levels are extremely low.  Percent saturation was also low suggesting iron deficiency anemia despite being on oral replacement.  Patient is here today for follow-up.  Patient has already received Feraheme.  He states that he feels much better after having received iron infusion.  He is also been scheduled to follow-up with heme-onc regarding his pancytopenia.  However on his exam today, the patient has a massively enlarged right lower extremity compared to the left lower extremity.  He states that this occurred at the end of last week.  He was in urgent care and was started on antibiotics for possible cellulitis, Keflex.  However the situation has not improved.  On exam, the right lower extremity distal to the knee is much larger than the left lower extremity.  He has +1 pitting edema in the right lower extremity.  It is warm to the touch.  It is slightly erythematous but does not appear to be cellulitic.  Instead it is inflamed and  hot.  He also complains of pain posteriorly in his calf raising the concern for a DVT.  At that time, my plan was: First I am concerned about a DVT.  I believe the pretest probability is extremely high.  Therefore I will start the patient on Xarelto 15 mg p.o. twice daily given the late hour of this office visit and schedule him for an urgent ultrasound of the lower extremities to rule out DVT in the morning.  If DVT is confirmed, patient will continue this medication and we will discuss long-term treatment option.  If ultrasound ruled out DVT, we may need to consider lymphedema versus atypical cellulitis.  Regarding his microcytic anemia, the patient has iron deficiency anemia and does not appear to absorb iron well from his diet.  He is improved dramatically after receiving an iron infusion.  However given his pancytopenia, I have recommended a consultation with hematology/oncology to rule out underlying bone marrow abnormalities.  This may be due to his cirrhosis however I believe we need to rule out myelodysplasia, etc.  Patient has already called and made this appointment and I have encouraged him to keep it.  Follow-up on Friday, await the results of the lower extremity DVT evaluation.   01/03/18 Venous ultrasound confirmed DVT:  Right: Findings consistent with age indeterminate deep vein thrombosis involving the right posterior tibial vein, and right peroneal vein.  Patient is currently on Xarelto 15 mg twice daily for 21 days.  He will then transition to Xarelto 20 mg a day.  He is here today to discuss.  I have several concerns.  First the patient was recently evaluated for a GI bleed.  Although no direct site of bleeding was identified his stool still guaiac positive.  He is now on powerful blood thinners for his DVT.  Therefore we need to monitor his CBC closely.  Recommended checking a CBC today and again in a week.  Second concern I have is the fact that this occurred seemingly without  provocation.  The patient has not had any recent prolonged periods of immobilization or any recent surgeries.  This raises the concern for hypercoagulable state.  As mentioned in my previous office visit, the patient has pancytopenia and I have concern for possible myelodysplasia versus pancytopenia secondary to his underlying cirrhosis.  Patient is currently receiving  iron infusions under the care of his gastroenterologist.  I believe he would also benefit from seeing a hematologist to discuss the causes of his pancytopenia and to possibly evaluate for underlying bone marrow abnormalities that may cause this and put the patient at risk for hypercoagulable state to cause his DVT.  I spent more than 25 minutes today with the patient explaining this.  Also states that his gastroenterologist was concerned about possible celiac disease as a cause of his blood loss.  Therefore he is requesting that we check a celiac panel. Past Medical History:  Diagnosis Date  . Allergy   . Diabetes mellitus without complication ([place])   . Gastritis and duodenitis    on egd 12/2017  . Hyperlipidemia    Patient denies  . Hypertension   . Nephrolithiasis   . 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  . Sleep apnea    Past Surgical History:  Procedure Laterality Date  . COLONOSCOPY    . JOINT REPLACEMENT     left hip Dr Noemi Chapel  . SPINE SURGERY     disectomy   Current Outpatient Medications on File Prior to Visit  Medication Sig Dispense Refill  . amLODipine (NORVASC) 5 MG tablet TAKE 1 TABLET BY MOUTH ONCE DAILY 90 tablet 2  . aspirin EC 81 MG tablet Take 81 mg by mouth daily.    . Blood Glucose Monitoring Suppl (CONTOUR NEXT MONITOR) w/Device KIT 1 strip by Does not apply route daily. 1 kit 0  . ferrous sulfate 325 (65 FE) MG EC tablet Take 325 mg by mouth daily.    Marland Kitchen glimepiride (AMARYL) 4 MG tablet TAKE 1 TABLET BY MOUTH EVERY DAY 90 tablet 0  . glucose blood (CONTOUR NEXT TEST)  test strip Check bs daily DX: E11.9 100 each 12  . Lancets Misc. MISC Use with daily bs check 100 each 3  . losartan (COZAAR) 100 MG tablet TAKE 1 TABLET BY MOUTH EVERY DAY 90 tablet 0  . metFORMIN (GLUCOPHAGE) 500 MG tablet TAKE 1 TABLET BY MOUTH 2 TIMES DAILY WITH A MEAL. 180 tablet 0  . metoprolol succinate (TOPROL-XL) 25 MG 24 hr tablet TAKE 1 TABLET BY MOUTH EVERY DAY 90 tablet 0  . OVER THE COUNTER MEDICATION B 12 , one tablet daily.    Marland Kitchen OVER THE COUNTER MEDICATION Vitamin D 3, 1200 mg. One time daily.    . pantoprazole (PROTONIX) 40 MG tablet Take 1 tablet (40 mg total) by mouth daily. 90 tablet 3   Current Facility-Administered Medications on File Prior to Visit  Medication Dose Route Frequency Provider Last Rate Last Dose  . 0.9 %  sodium chloride infusion   Intravenous PRN Hilarie Fredrickson Lajuan Lines, MD 10 mL/hr at 01/03/18 1325     No Known Allergies Social History   Socioeconomic History  . Marital status: Married    Spouse name: Not on file  . Number of children: Not on file  . Years of education: Not on file  . Highest education level: Not on file  Occupational History  . Not on file  Social Needs  . Financial resource strain: Not on file  . Food insecurity:    Worry: Not on file    Inability: Not on file  . Transportation needs:    Medical: Not on file    Non-medical: Not on file  Tobacco Use  . Smoking status: Never Smoker  . Smokeless tobacco: Never Used  Substance  and Sexual Activity  . Alcohol use: No  . Drug use: No  . Sexual activity: Not on file  Lifestyle  . Physical activity:    Days per week: Not on file    Minutes per session: Not on file  . Stress: Not on file  Relationships  . Social connections:    Talks on phone: Not on file    Gets together: Not on file    Attends religious service: Not on file    Active member of club or organization: Not on file    Attends meetings of clubs or organizations: Not on file    Relationship status: Not on file  .  Intimate partner violence:    Fear of current or ex partner: Not on file    Emotionally abused: Not on file    Physically abused: Not on file    Forced sexual activity: Not on file  Other Topics Concern  . Not on file  Social History Narrative  . Not on file      Review of Systems  All other systems reviewed and are negative.      Objective:   Physical Exam  Constitutional: He appears well-developed and well-nourished. No distress.  Neck: No JVD present.  Cardiovascular: Normal rate, regular rhythm and normal heart sounds. Exam reveals no gallop and no friction rub.  No murmur heard. Pulmonary/Chest: Effort normal and breath sounds normal. No respiratory distress. He has no wheezes. He has no rales.  Abdominal: Soft. Bowel sounds are normal. He exhibits no distension and no mass. There is no tenderness. There is no rebound and no guarding.  Musculoskeletal: He exhibits edema.       Right lower leg: He exhibits tenderness, swelling and edema.  Skin: He is not diaphoretic.  Vitals reviewed.    Asymmetric edema in the right leg distal to the knee Assessment & Plan:  Deep venous thrombosis (DVT) of right peroneal vein, unspecified chronicity - Plan: CBC with Differential/Platelet, Ambulatory referral to Hematology / Oncology  Microcytic anemia  Pancytopenia ([place]) - Plan: Ambulatory referral to Hematology / Oncology  Blood in stool - Plan: Celiac Disease Panel, CBC with Differential/Platelet  I will consult heme-onc for evaluation of his pancytopenia and also to determine if the patient has a hypercoagulable state that triggered his DVT as this seems to be an unprovoked DVT.  Regarding his DVT, the patient will be treated with Xarelto 15 mg twice daily for 21 days and then transition to 20 mg once daily.  I sent him a prescription for the 20 mg once daily.  He has a starter pack of Xarelto to complete the first month.  I will check a CBC today to monitor for anemia and get a  baseline hemoglobin.  I would like to recheck his CBC in 1 week to ensure that he is not losing blood on the Xarelto.  If he is losing significant amounts of blood we will need to discontinue the Xarelto and consider an IVC filter in his right leg.  More than 25 minutes was spent with the patient explaining the situation.  The patient comes back in 1 week to recheck his CBC I will also check a celiac panel.

## 2018-01-03 NOTE — Progress Notes (Signed)
[facility]   Diagnosis: Iron deficiency anemia  Provider: Dr. Hilarie Fredrickson  Procedure: Feraheme infusion  Note:  Patient received IV Feraheme. Monitored 30 minutes post infusion, tolerated the procedure, no adverse reaction noted. Discharge instructions given, verbalized understanding. Patient alert, oriented and ambulatory at discharge.

## 2018-01-03 NOTE — Discharge Instructions (Signed)

## 2018-01-05 ENCOUNTER — Other Ambulatory Visit: Payer: Self-pay | Admitting: Family Medicine

## 2018-01-06 ENCOUNTER — Other Ambulatory Visit: Payer: Self-pay | Admitting: Family Medicine

## 2018-01-06 DIAGNOSIS — H17821 Peripheral opacity of cornea, right eye: Secondary | ICD-10-CM | POA: Diagnosis not present

## 2018-01-06 DIAGNOSIS — H52223 Regular astigmatism, bilateral: Secondary | ICD-10-CM | POA: Diagnosis not present

## 2018-01-06 DIAGNOSIS — H5213 Myopia, bilateral: Secondary | ICD-10-CM | POA: Diagnosis not present

## 2018-01-06 DIAGNOSIS — E119 Type 2 diabetes mellitus without complications: Secondary | ICD-10-CM | POA: Diagnosis not present

## 2018-01-08 ENCOUNTER — Other Ambulatory Visit: Payer: Medicare Other

## 2018-01-14 ENCOUNTER — Other Ambulatory Visit: Payer: Medicare Other

## 2018-01-14 ENCOUNTER — Inpatient Hospital Stay (HOSPITAL_COMMUNITY): Payer: Medicare Other | Attending: Internal Medicine | Admitting: Internal Medicine

## 2018-01-14 ENCOUNTER — Encounter (HOSPITAL_COMMUNITY): Payer: Self-pay | Admitting: Internal Medicine

## 2018-01-14 ENCOUNTER — Other Ambulatory Visit: Payer: Self-pay

## 2018-01-14 VITALS — BP 139/67 | HR 72 | Temp 98.1°F | Resp 16 | Wt 263.7 lb

## 2018-01-14 DIAGNOSIS — I82441 Acute embolism and thrombosis of right tibial vein: Secondary | ICD-10-CM | POA: Diagnosis not present

## 2018-01-14 DIAGNOSIS — K7460 Unspecified cirrhosis of liver: Secondary | ICD-10-CM | POA: Diagnosis not present

## 2018-01-14 DIAGNOSIS — Z7901 Long term (current) use of anticoagulants: Secondary | ICD-10-CM

## 2018-01-14 DIAGNOSIS — I700 Atherosclerosis of aorta: Secondary | ICD-10-CM

## 2018-01-14 DIAGNOSIS — R161 Splenomegaly, not elsewhere classified: Secondary | ICD-10-CM

## 2018-01-14 DIAGNOSIS — K921 Melena: Secondary | ICD-10-CM

## 2018-01-14 DIAGNOSIS — N62 Hypertrophy of breast: Secondary | ICD-10-CM | POA: Diagnosis not present

## 2018-01-14 DIAGNOSIS — D509 Iron deficiency anemia, unspecified: Secondary | ICD-10-CM | POA: Diagnosis not present

## 2018-01-14 DIAGNOSIS — D508 Other iron deficiency anemias: Secondary | ICD-10-CM

## 2018-01-14 DIAGNOSIS — I10 Essential (primary) hypertension: Secondary | ICD-10-CM | POA: Diagnosis not present

## 2018-01-14 DIAGNOSIS — I82491 Acute embolism and thrombosis of other specified deep vein of right lower extremity: Secondary | ICD-10-CM

## 2018-01-14 DIAGNOSIS — N200 Calculus of kidney: Secondary | ICD-10-CM | POA: Insufficient documentation

## 2018-01-14 NOTE — Patient Instructions (Signed)
[facility]  Discharge Instructions: You saw Dr. Higgs today                               _______________________________________________________________  Thank you for choosing [facility] to provide your oncology and hematology care.  To afford each patient quality time with our providers, please arrive at least 15 minutes before your scheduled appointment.  You need to re-schedule your appointment if you arrive 10 or more minutes late.  We strive to give you quality time with our providers, and arriving late affects you and other patients whose appointments are after yours.  Also, if you no show three or more times for appointments you may be dismissed from the clinic.  Again, thank you for choosing  [facility]. Our hope is that these requests will allow you access to exceptional care and in a timely manner. _______________________________________________________________  If you have questions after your visit, please contact our office at [phone] between the hours of 8:30 a.m. and 5:00 p.m. Voicemails left after 4:30 p.m. will not be returned until the following business day. _______________________________________________________________  For prescription refill requests, have your pharmacy contact our office. _______________________________________________________________  Recommendations made by the consultant and any test results will be sent to your referring physician. _______________________________________________________________ 

## 2018-01-14 NOTE — Progress Notes (Signed)
Referring Physician:  Dr. Dennard Schaumann  Diagnosis Other iron deficiency anemia - Plan: CBC with Differential/Platelet, Comprehensive metabolic panel, Lactate dehydrogenase, Ferritin, Protein electrophoresis, serum, Vitamin B12, Folate, Iron and TIBC, Transferrin Saturation  Staging Cancer Staging No matching staging information was found for the patient.  Assessment and Plan:  1.  Microcytic anemia.  67 year old male referred for evaluation due to anemia.  Pt has been diagnosed with DVT and is on Xarelto.  He has been told he may have celiac disease and cirrhosis.  He is undergoing evaluation by GI.  Pt had recent iron infusion.  He denies any blood in stool or urine.  He had never had blood transfusion.  Pt had EGD and colonoscopy done 12/16/2017 that showed normal esophagus with evidence of gastritis and duodenitis.  Pt also had tubular adenoma with no malignancy or dysplasia noted.  Labs done 01/03/2018 showed Wbc 3.6 hb 11.7 and plts 135,000.  Pt is seen today for consultation due to anemia and DVT.    Pt was last treated with IV iron on 01/03/2018.  I discussed with him we will repeat labs and iron studies in 2-3 weeks for follow-up after IV iron.  Hb is improved at 11.7 compared to labs done 12/16/2017.  He should continue to follow-up with GI due to concerns for Celiac disease and cirrhosis.    2.  Celiac evaluation.   Cirrhosis changes were noted on CT abdomen and pelvis done 09/04/2017 that was reviewed and showed  Impression:  No acute intra-abdominal or pelvic finding by CT.  Hepatic cirrhosis with mild splenomegaly. No other complicating feature of cirrhosis.  Nonobstructing punctate left nephrolithiasis.  Aortic atherosclerosis  Gynecomastia  Pt should continue to follow-up with GI for work-up.    3.  Right LE DVT.  Pt had LE USN done 01/01/2018 that was reviewed and showed  Summary: Right: Findings consistent with age indeterminate deep vein thrombosis involving the right  posterior tibial vein, and right peroneal vein. No cystic structure found in the popliteal fossa. Left: No evidence of common femoral vein obstruction.  He is currently on Xarelto.  Pt will undergo hypercoagulable evaluation on RTC.    4.  HTN.  BP is 139/67.  Follow-up with PCP.    5  Cirrhosis.  This was noted on recent CT done 09/04/2017.  Pt should follow-up with GI as directed.  Hepatitis panel checked in 08/2016 was negative.    Greater than 40 minutes spent with review of records counseling and coordination of care.     HPI:  67 year old male referred for evaluation due to anemia.  Pt has been diagnosed with DVT and is on Xarelto.  He has been told he may have celiac disease and cirrhosis.  He is undergoing evaluation by GI.  Pt had recent iron infusion.  He denies any blood in stool or urine.  He had never had blood transfusion.  Pt had EGD and colonoscopy done 12/16/2017 that showed normal esophagus with evidence of gastritis and duodenitis.  Pt also had tubular adenoma with no malignancy or dysplasia noted.  Labs done 01/03/2018 showed Wbc 3.6 hb 11.7 and plts 135,000.  Pt is seen today for consultation due to anemia and DVT.    Problem List Patient Active Problem List   Diagnosis Date Noted  . Gastritis and duodenitis [K29.90]   . PVD (peripheral vascular disease) ([place]) [I73.9]   . Type II diabetes mellitus, uncontrolled ([place]) [E11.65] 11/08/2016  . HTN (hypertension), benign [  I10] 11/08/2016  . HLD (hyperlipidemia) [E78.5] 11/08/2016  . History of nephrolithiasis [Z87.442] 11/08/2016    Past Medical History Past Medical History:  Diagnosis Date  . Allergy   . Diabetes mellitus without complication ([place])   . Gastritis and duodenitis    on egd 12/2017  . Hyperlipidemia    Patient denies  . Hypertension   . Nephrolithiasis   . 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  . Sleep apnea     Past Surgical History Past Surgical History:   Procedure Laterality Date  . COLONOSCOPY    . JOINT REPLACEMENT     left hip Dr Noemi Chapel  . SPINE SURGERY     disectomy    Family History Family History  Problem Relation Age of Onset  . Esophageal cancer Father   . Colon cancer Neg Hx   . Rectal cancer Neg Hx   . Stomach cancer Neg Hx      Social History  reports that he has never smoked. He has never used smokeless tobacco. He reports that he does not drink alcohol or use drugs.  Medications  Current Outpatient Medications:  .  amLODipine (NORVASC) 5 MG tablet, TAKE 1 TABLET BY MOUTH ONCE DAILY, Disp: 90 tablet, Rfl: 2 .  Blood Glucose Monitoring Suppl (CONTOUR NEXT MONITOR) w/Device KIT, 1 strip by Does not apply route daily., Disp: 1 kit, Rfl: 0 .  ferrous sulfate 325 (65 FE) MG EC tablet, Take 325 mg by mouth daily., Disp: , Rfl:  .  glimepiride (AMARYL) 4 MG tablet, TAKE 1 TABLET BY MOUTH EVERY DAY, Disp: 90 tablet, Rfl: 0 .  glucose blood (CONTOUR NEXT TEST) test strip, Check bs daily DX: E11.9, Disp: 100 each, Rfl: 12 .  Lancets Misc. MISC, Use with daily bs check, Disp: 100 each, Rfl: 3 .  losartan (COZAAR) 100 MG tablet, TAKE 1 TABLET BY MOUTH EVERY DAY, Disp: 90 tablet, Rfl: 0 .  metFORMIN (GLUCOPHAGE) 500 MG tablet, TAKE 1 TABLET BY MOUTH 2 TIMES DAILY WITH A MEAL., Disp: 180 tablet, Rfl: 0 .  metoprolol succinate (TOPROL-XL) 25 MG 24 hr tablet, TAKE 1 TABLET BY MOUTH EVERY DAY, Disp: 90 tablet, Rfl: 3 .  OVER THE COUNTER MEDICATION, B 12 , one tablet daily., Disp: , Rfl:  .  OVER THE COUNTER MEDICATION, Vitamin D 3, 1200 mg. One time daily., Disp: , Rfl:  .  pantoprazole (PROTONIX) 40 MG tablet, Take 1 tablet (40 mg total) by mouth daily., Disp: 90 tablet, Rfl: 3 .  rivaroxaban (XARELTO) 20 MG TABS tablet, Take 1 tablet (20 mg total) by mouth daily with supper., Disp: 30 tablet, Rfl: 4  Allergies Patient has no known allergies.  Review of Systems Review of Systems - Oncology ROS negative   Physical  Exam  Vitals Wt Readings from Last 3 Encounters:  01/14/18 263 lb 11.2 oz (119.6 kg)  01/03/18 262 lb (118.8 kg)  12/31/17 266 lb (120.7 kg)   Temp Readings from Last 3 Encounters:  01/14/18 98.1 F (36.7 C) (Oral)  01/03/18 (!) 97.5 F (36.4 C) (Oral)  01/03/18 (!) 97.3 F (36.3 C)   BP Readings from Last 3 Encounters:  01/14/18 139/67  01/03/18 126/62  01/03/18 (!) 144/76   Pulse Readings from Last 3 Encounters:  01/14/18 72  01/03/18 70  01/03/18 80   Constitutional: Well-developed, well-nourished, and in no distress.   HENT: Head: Normocephalic and atraumatic.  Mouth/Throat: No oropharyngeal exudate. Mucosa  moist. Eyes: Pupils are equal, round, and reactive to light. Conjunctivae are normal. No scleral icterus.  Neck: Normal range of motion. Neck supple. No JVD present.  Cardiovascular: Normal rate, regular rhythm and normal heart sounds.  Exam reveals no gallop and no friction rub.   No murmur heard. Pulmonary/Chest: Effort normal and breath sounds normal. No respiratory distress. No wheezes.No rales.  Abdominal: Soft. Bowel sounds are normal. Obese.  There is no tenderness. There is no guarding.  Musculoskeletal: No edema or tenderness.  Lymphadenopathy: No cervical,axillary or supraclavicular adenopathy.  Neurological: Alert and oriented to person, place, and time. No cranial nerve deficit.  Skin: Skin is warm and dry. No rash noted. No erythema. No pallor.  Psychiatric: Affect and judgment normal.   Labs No visits with results within 3 Day(s) from this visit.  Latest known visit with results is:  Office Visit on 01/03/2018  Component Date Value Ref Range Status  . WBC 01/03/2018 3.6* 3.8 - 10.8 Thousand/uL Final  . RBC 01/03/2018 4.62  4.20 - 5.80 Million/uL Final  . Hemoglobin 01/03/2018 11.7* 13.2 - 17.1 g/dL Final  . HCT 01/03/2018 35.9* 38.5 - 50.0 % Final  . MCV 01/03/2018 77.7* 80.0 - 100.0 fL Final  . MCH 01/03/2018 25.3* 27.0 - 33.0 pg Final  . MCHC  01/03/2018 32.6  32.0 - 36.0 g/dL Final  . RDW 01/03/2018 17.0* 11.0 - 15.0 % Final  . Platelets 01/03/2018 135* 140 - 400 Thousand/uL Final  . MPV 01/03/2018 12.0  7.5 - 12.5 fL Final  . Neutro Abs 01/03/2018 2,038  1,500 - 7,800 cells/uL Final  . Lymphs Abs 01/03/2018 1,037  850 - 3,900 cells/uL Final  . WBC mixed population 01/03/2018 414  200 - 950 cells/uL Final  . Eosinophils Absolute 01/03/2018 90  15 - 500 cells/uL Final  . Basophils Absolute 01/03/2018 22  0 - 200 cells/uL Final  . Neutrophils Relative % 01/03/2018 56.6  % Final  . Total Lymphocyte 01/03/2018 28.8  % Final  . Monocytes Relative 01/03/2018 11.5  % Final  . Eosinophils Relative 01/03/2018 2.5  % Final  . Basophils Relative 01/03/2018 0.6  % Final     Pathology Orders Placed This Encounter  Procedures  . CBC with Differential/Platelet    Standing Status:   Future    Standing Expiration Date:   01/15/2019  . Comprehensive metabolic panel    Standing Status:   Future    Standing Expiration Date:   01/15/2019  . Lactate dehydrogenase    Standing Status:   Future    Standing Expiration Date:   01/15/2019  . Ferritin    Standing Status:   Future    Standing Expiration Date:   01/15/2019  . Protein electrophoresis, serum    Standing Status:   Future    Standing Expiration Date:   01/15/2019  . Vitamin B12    Standing Status:   Future    Standing Expiration Date:   01/15/2019  . Folate    Standing Status:   Future    Standing Expiration Date:   01/15/2019  . Iron and TIBC    Standing Status:   Future    Standing Expiration Date:   01/15/2019  . Transferrin Saturation    Standing Status:   Future    Standing Expiration Date:   01/15/2019       Zoila Shutter MD

## 2018-01-16 ENCOUNTER — Encounter: Payer: Self-pay | Admitting: Family Medicine

## 2018-01-16 DIAGNOSIS — K900 Celiac disease: Secondary | ICD-10-CM | POA: Insufficient documentation

## 2018-01-16 LAB — CELIAC DISEASE PANEL
(tTG) Ab, IgA: >100 U/mL — ABNORMAL HIGH
(tTG) Ab, IgG: 87 U/mL — ABNORMAL HIGH
Gliadin IgA: >100 Units — ABNORMAL HIGH
Gliadin IgG: >100 Units — ABNORMAL HIGH
Immunoglobulin A: 612 mg/dL — ABNORMAL HIGH (ref 20–320)

## 2018-01-16 LAB — CBC WITH DIFFERENTIAL/PLATELET
Basophils Absolute: 21 cells/uL (ref 0–200)
Basophils Relative: 0.8 %
Eosinophils Absolute: 60 cells/uL (ref 15–500)
Eosinophils Relative: 2.3 %
HCT: 36.9 % — ABNORMAL LOW (ref 38.5–50.0)
Hemoglobin: 12 g/dL — ABNORMAL LOW (ref 13.2–17.1)
Lymphs Abs: 666 cells/uL — ABNORMAL LOW (ref 850–3900)
MCH: 26 pg — ABNORMAL LOW (ref 27.0–33.0)
MCHC: 32.5 g/dL (ref 32.0–36.0)
MCV: 80 fL (ref 80.0–100.0)
MPV: 13 fL — ABNORMAL HIGH (ref 7.5–12.5)
Monocytes Relative: 12 %
Neutro Abs: 1542 cells/uL (ref 1500–7800)
Neutrophils Relative %: 59.3 %
Platelets: 100 10*3/uL — ABNORMAL LOW (ref 140–400)
RBC: 4.61 10*6/uL (ref 4.20–5.80)
RDW: 18.9 % — ABNORMAL HIGH (ref 11.0–15.0)
Total Lymphocyte: 25.6 %
WBC mixed population: 312 cells/uL (ref 200–950)
WBC: 2.6 10*3/uL — ABNORMAL LOW (ref 3.8–10.8)

## 2018-01-21 ENCOUNTER — Inpatient Hospital Stay (HOSPITAL_COMMUNITY): Payer: Medicare Other

## 2018-01-21 DIAGNOSIS — D509 Iron deficiency anemia, unspecified: Secondary | ICD-10-CM | POA: Diagnosis not present

## 2018-01-21 DIAGNOSIS — I10 Essential (primary) hypertension: Secondary | ICD-10-CM | POA: Diagnosis not present

## 2018-01-21 DIAGNOSIS — K7460 Unspecified cirrhosis of liver: Secondary | ICD-10-CM | POA: Diagnosis not present

## 2018-01-21 DIAGNOSIS — Z7901 Long term (current) use of anticoagulants: Secondary | ICD-10-CM | POA: Diagnosis not present

## 2018-01-21 DIAGNOSIS — N200 Calculus of kidney: Secondary | ICD-10-CM | POA: Diagnosis not present

## 2018-01-21 DIAGNOSIS — D508 Other iron deficiency anemias: Secondary | ICD-10-CM

## 2018-01-21 DIAGNOSIS — I700 Atherosclerosis of aorta: Secondary | ICD-10-CM | POA: Diagnosis not present

## 2018-01-21 DIAGNOSIS — N62 Hypertrophy of breast: Secondary | ICD-10-CM | POA: Diagnosis not present

## 2018-01-21 DIAGNOSIS — I82491 Acute embolism and thrombosis of other specified deep vein of right lower extremity: Secondary | ICD-10-CM

## 2018-01-21 DIAGNOSIS — I82441 Acute embolism and thrombosis of right tibial vein: Secondary | ICD-10-CM | POA: Diagnosis not present

## 2018-01-21 DIAGNOSIS — R161 Splenomegaly, not elsewhere classified: Secondary | ICD-10-CM | POA: Diagnosis not present

## 2018-01-21 LAB — CBC WITH DIFFERENTIAL/PLATELET
Abs Immature Granulocytes: 0 10*3/uL (ref 0.00–0.07)
Basophils Absolute: 0 10*3/uL (ref 0.0–0.1)
Basophils Relative: 1 %
Eosinophils Absolute: 0.1 10*3/uL (ref 0.0–0.5)
Eosinophils Relative: 3 %
HCT: 39.3 % (ref 39.0–52.0)
Hemoglobin: 12.5 g/dL — ABNORMAL LOW (ref 13.0–17.0)
Immature Granulocytes: 0 %
Lymphocytes Relative: 27 %
Lymphs Abs: 0.9 10*3/uL (ref 0.7–4.0)
MCH: 25.8 pg — ABNORMAL LOW (ref 26.0–34.0)
MCHC: 31.8 g/dL (ref 30.0–36.0)
MCV: 81.2 fL (ref 80.0–100.0)
Monocytes Absolute: 0.4 10*3/uL (ref 0.1–1.0)
Monocytes Relative: 12 %
Neutro Abs: 1.9 10*3/uL (ref 1.7–7.7)
Neutrophils Relative %: 57 %
Platelets: 107 10*3/uL — ABNORMAL LOW (ref 150–400)
RBC: 4.84 MIL/uL (ref 4.22–5.81)
RDW: 19.2 % — ABNORMAL HIGH (ref 11.5–15.5)
WBC: 3.3 10*3/uL — ABNORMAL LOW (ref 4.0–10.5)
nRBC: 0 % (ref 0.0–0.2)

## 2018-01-21 LAB — IRON AND TIBC
Iron: 76 ug/dL (ref 45–182)
Saturation Ratios: 18 % (ref 17.9–39.5)
TIBC: 431 ug/dL (ref 250–450)
UIBC: 355 ug/dL

## 2018-01-21 LAB — LACTATE DEHYDROGENASE: LDH: 131 U/L (ref 98–192)

## 2018-01-21 LAB — COMPREHENSIVE METABOLIC PANEL
ALT: 40 U/L (ref 0–44)
AST: 33 U/L (ref 15–41)
Albumin: 4.2 g/dL (ref 3.5–5.0)
Alkaline Phosphatase: 91 U/L (ref 38–126)
Anion gap: 7 (ref 5–15)
BUN: 12 mg/dL (ref 8–23)
CO2: 24 mmol/L (ref 22–32)
Calcium: 8.8 mg/dL — ABNORMAL LOW (ref 8.9–10.3)
Chloride: 105 mmol/L (ref 98–111)
Creatinine, Ser: 0.76 mg/dL (ref 0.61–1.24)
GFR calc Af Amer: >60 mL/min (ref >60)
GFR calc non Af Amer: >60 mL/min (ref >60)
Glucose, Bld: 91 mg/dL (ref 70–99)
Potassium: 3.9 mmol/L (ref 3.5–5.1)
Sodium: 136 mmol/L (ref 135–145)
Total Bilirubin: 0.9 mg/dL (ref 0.3–1.2)
Total Protein: 8 g/dL (ref 6.5–8.1)

## 2018-01-21 LAB — TRANSFERRIN: Transferrin: 309 mg/dL (ref 180–329)

## 2018-01-21 LAB — FOLATE: Folate: 4.5 ng/mL — ABNORMAL LOW (ref >5.9)

## 2018-01-21 LAB — FERRITIN: Ferritin: 69 ng/mL (ref 24–336)

## 2018-01-21 LAB — VITAMIN B12: Vitamin B-12: 845 pg/mL (ref 180–914)

## 2018-01-22 LAB — PROTEIN ELECTROPHORESIS, SERUM
A/G Ratio: 1.2 (ref 0.7–1.7)
Albumin ELP: 4 g/dL (ref 2.9–4.4)
Alpha-1-Globulin: 0.2 g/dL (ref 0.0–0.4)
Alpha-2-Globulin: 0.6 g/dL (ref 0.4–1.0)
Beta Globulin: 1.2 g/dL (ref 0.7–1.3)
Gamma Globulin: 1.3 g/dL (ref 0.4–1.8)
Globulin, Total: 3.3 g/dL (ref 2.2–3.9)
Total Protein ELP: 7.3 g/dL (ref 6.0–8.5)

## 2018-01-22 LAB — BETA-2-GLYCOPROTEIN I ABS, IGG/M/A
Beta-2 Glyco I IgG: <9 GPI IgG units (ref 0–20)
Beta-2-Glycoprotein I IgA: <9 GPI IgA units (ref 0–25)
Beta-2-Glycoprotein I IgM: <9 GPI IgM units (ref 0–32)

## 2018-01-22 LAB — ANTINUCLEAR ANTIBODIES, IFA: ANA Ab, IFA: NEGATIVE

## 2018-01-23 LAB — LUPUS ANTICOAGULANT PANEL
DRVVT: 133 s — ABNORMAL HIGH (ref 0.0–47.0)
PTT Lupus Anticoagulant: 50.8 s (ref 0.0–51.9)

## 2018-01-23 LAB — DRVVT CONFIRM: dRVVT Confirm: 2.2 ratio — ABNORMAL HIGH (ref 0.8–1.2)

## 2018-01-23 LAB — DRVVT MIX: dRVVT Mix: 72 s — ABNORMAL HIGH (ref 0.0–47.0)

## 2018-01-24 LAB — FACTOR 5 LEIDEN

## 2018-01-27 ENCOUNTER — Telehealth: Payer: Self-pay | Admitting: Family Medicine

## 2018-01-27 LAB — PROTHROMBIN GENE MUTATION

## 2018-01-27 MED ORDER — RIVAROXABAN 20 MG PO TABS: 20.0000 mg | ORAL_TABLET | Freq: Every day | ORAL | 4 refills | Status: DC

## 2018-01-27 NOTE — Telephone Encounter (Signed)
Medication called/sent to requested pharmacy  

## 2018-01-27 NOTE — Telephone Encounter (Signed)
Refill on xarelto to [facility]

## 2018-01-28 ENCOUNTER — Encounter (HOSPITAL_COMMUNITY): Payer: Self-pay | Admitting: Internal Medicine

## 2018-01-28 ENCOUNTER — Other Ambulatory Visit (HOSPITAL_COMMUNITY): Payer: Self-pay | Admitting: Internal Medicine

## 2018-01-28 ENCOUNTER — Inpatient Hospital Stay (HOSPITAL_BASED_OUTPATIENT_CLINIC_OR_DEPARTMENT_OTHER): Payer: Medicare Other | Admitting: Internal Medicine

## 2018-01-28 VITALS — BP 144/57 | HR 70 | Temp 98.1°F | Resp 18 | Wt 260.0 lb

## 2018-01-28 DIAGNOSIS — D509 Iron deficiency anemia, unspecified: Secondary | ICD-10-CM

## 2018-01-28 DIAGNOSIS — I82441 Acute embolism and thrombosis of right tibial vein: Secondary | ICD-10-CM | POA: Diagnosis not present

## 2018-01-28 DIAGNOSIS — R161 Splenomegaly, not elsewhere classified: Secondary | ICD-10-CM

## 2018-01-28 DIAGNOSIS — N200 Calculus of kidney: Secondary | ICD-10-CM | POA: Diagnosis not present

## 2018-01-28 DIAGNOSIS — I10 Essential (primary) hypertension: Secondary | ICD-10-CM | POA: Diagnosis not present

## 2018-01-28 DIAGNOSIS — K7460 Unspecified cirrhosis of liver: Secondary | ICD-10-CM

## 2018-01-28 DIAGNOSIS — Z7901 Long term (current) use of anticoagulants: Secondary | ICD-10-CM | POA: Diagnosis not present

## 2018-01-28 DIAGNOSIS — I700 Atherosclerosis of aorta: Secondary | ICD-10-CM

## 2018-01-28 DIAGNOSIS — N62 Hypertrophy of breast: Secondary | ICD-10-CM | POA: Diagnosis not present

## 2018-01-28 DIAGNOSIS — I82491 Acute embolism and thrombosis of other specified deep vein of right lower extremity: Secondary | ICD-10-CM

## 2018-01-28 NOTE — Patient Instructions (Signed)
[facility] Discharge Instructions  You were seen by Dr. Higgs today   Thank you for choosing [facility] at [facility] to provide your oncology and hematology care.  To afford each patient quality time with our provider, please arrive at least 15 minutes before your scheduled appointment time.   If you have a lab appointment with the Cancer Center please come in thru the  Main Entrance and check in at the main information desk  You need to re-schedule your appointment should you arrive 10 or more minutes late.  We strive to give you quality time with our providers, and arriving late affects you and other patients whose appointments are after yours.  Also, if you no show three or more times for appointments you may be dismissed from the clinic at the providers discretion.     Again, thank you for choosing [facility].  Our hope is that these requests will decrease the amount of time that you wait before being seen by our physicians.       _____________________________________________________________  Should you have questions after your visit to [facility], please contact our office at [phone] between the hours of 8:00 a.m. and 4:30 p.m.  Voicemails left after 4:00 p.m. will not be returned until the following business day.  For prescription refill requests, have your pharmacy contact our office and allow 72 hours.    Cancer Center Support Programs:   > Cancer Support Group  2nd Tuesday of the month 1pm-2pm, Journey Room   

## 2018-01-28 NOTE — Progress Notes (Signed)
Diagnosis No diagnosis found.  Staging Cancer Staging No matching staging information was found for the patient.  Assessment and Plan:  1.  Microcytic anemia.  67 year old male referred for evaluation due to anemia.  Pt has been diagnosed with DVT and is on Xarelto.  He has been told he may have celiac disease and cirrhosis.  He is undergoing evaluation by GI.    Pt was treated with IV iron in 12/2017.  Labs done 01/21/2018 reviewed and showed WBC 3.3 HB 12.5 plts 107,000.  Chemistries WNL with K+ 3.9 Cr 0.76 and normal LFTs.  Ferritin is 69 which is improved from 16 on labs done in 12/2017.   Pt had EGD and colonoscopy done 12/16/2017 that showed normal esophagus with evidence of gastritis and duodenitis.  Pt also had tubular adenoma with no malignancy or dysplasia noted.  He should continue to follow-up with GI due to concerns for Celiac disease and cirrhosis.  Pt will follow-up in 06/2018 with labs and doppler results.    2.  Celiac evaluation.   Cirrhosis changes were noted on CT abdomen and pelvis done 09/04/2017 that was reviewed and showed  Impression:  No acute intra-abdominal or pelvic finding by CT.  Hepatic cirrhosis with mild splenomegaly. No other complicating feature of cirrhosis.  Nonobstructing punctate left nephrolithiasis.  Aortic atherosclerosis  Gynecomastia  Pt should continue to follow-up with GI for work-up.  I have provided written information regarding celiac disease that he can discuss with GI.    3.  Right LE DVT.  Pt had LE USN done 01/01/2018 that was reviewed and showed  Summary: Right: Findings consistent with age indeterminate deep vein thrombosis involving the right posterior tibial vein, and right peroneal vein. No cystic structure found in the popliteal fossa. Left: No evidence of common femoral vein obstruction.  He is currently on Xarelto.  Hypercoagulable evaluation was negative other than + LA. Which could be affected by anticoagulation.  He  will have repeat labs on RTC.  He will have repeat doppler in 06/2018 and will follow-up at that time to go over results.  He is recommended for 6 months of therapy.    4.  HTN.  BP is 144/57.  Follow-up with PCP.    5  Cirrhosis.  This was noted on recent CT done 09/04/2017.  Pt should follow-up with GI as directed.  Hepatitis panel checked in 08/2016 was negative.  Iron studies showed Ferritin of 69 and transferrin saturation of 18.    25 minutes spent with review of records counseling and coordination of care.    Interval History:  Historical data obtained from note dated 01/14/2018.  67 year old male referred for evaluation due to anemia.  Pt has been diagnosed with DVT and is on Xarelto.  He has been told he may have celiac disease and cirrhosis.  He is undergoing evaluation by GI.  Pt had recent iron infusion.  He denies any blood in stool or urine.  He had never had blood transfusion.  Pt had EGD and colonoscopy done 12/16/2017 that showed normal esophagus with evidence of gastritis and duodenitis.  Pt also had tubular adenoma with no malignancy or dysplasia noted.  Labs done 01/03/2018 showed Wbc 3.6 hb 11.7 and plts 135,000.    Current Status:  Pt is seen today for follow-up.  He is here to go over labs.  He has questions regarding celiac disease.     Problem List Patient Active Problem List  Diagnosis Date Noted  . Celiac disease [K90.0]   . Gastritis and duodenitis [K29.90]   . PVD (peripheral vascular disease) ([place]) [I73.9]   . Type II diabetes mellitus, uncontrolled ([facility]) [E11.65] 11/08/2016  . HTN (hypertension), benign [I10] 11/08/2016  . HLD (hyperlipidemia) [E78.5] 11/08/2016  . History of nephrolithiasis [Z87.442] 11/08/2016    Past Medical History Past Medical History:  Diagnosis Date  . Allergy   . Celiac disease   . Diabetes mellitus without complication ([facility] [place])   . Gastritis and duodenitis    on egd 12/2017  . Hyperlipidemia    Patient denies  . Hypertension   .  Nephrolithiasis   . 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  . Sleep apnea     Past Surgical History Past Surgical History:  Procedure Laterality Date  . COLONOSCOPY    . JOINT REPLACEMENT     left hip Dr Noemi Chapel  . SPINE SURGERY     disectomy    Family History Family History  Problem Relation Age of Onset  . Esophageal cancer Father   . Colon cancer Neg Hx   . Rectal cancer Neg Hx   . Stomach cancer Neg Hx      Social History  reports that he has never smoked. He has never used smokeless tobacco. He reports that he does not drink alcohol or use drugs.  Medications  Current Outpatient Medications:  .  amLODipine (NORVASC) 5 MG tablet, TAKE 1 TABLET BY MOUTH ONCE DAILY, Disp: 90 tablet, Rfl: 2 .  Blood Glucose Monitoring Suppl (CONTOUR NEXT MONITOR) w/Device KIT, 1 strip by Does not apply route daily., Disp: 1 kit, Rfl: 0 .  ferrous sulfate 325 (65 FE) MG EC tablet, Take 325 mg by mouth daily., Disp: , Rfl:  .  glimepiride (AMARYL) 4 MG tablet, TAKE 1 TABLET BY MOUTH EVERY DAY, Disp: 90 tablet, Rfl: 0 .  glucose blood (CONTOUR NEXT TEST) test strip, Check bs daily DX: E11.9, Disp: 100 each, Rfl: 12 .  Lancets Misc. MISC, Use with daily bs check, Disp: 100 each, Rfl: 3 .  losartan (COZAAR) 100 MG tablet, TAKE 1 TABLET BY MOUTH EVERY DAY, Disp: 90 tablet, Rfl: 0 .  metFORMIN (GLUCOPHAGE) 500 MG tablet, TAKE 1 TABLET BY MOUTH 2 TIMES DAILY WITH A MEAL., Disp: 180 tablet, Rfl: 0 .  metoprolol succinate (TOPROL-XL) 25 MG 24 hr tablet, TAKE 1 TABLET BY MOUTH EVERY DAY, Disp: 90 tablet, Rfl: 3 .  OVER THE COUNTER MEDICATION, B 12 , one tablet daily., Disp: , Rfl:  .  OVER THE COUNTER MEDICATION, Vitamin D 3, 1200 mg. One time daily., Disp: , Rfl:  .  pantoprazole (PROTONIX) 40 MG tablet, Take 1 tablet (40 mg total) by mouth daily., Disp: 90 tablet, Rfl: 3 .  rivaroxaban (XARELTO) 20 MG TABS tablet, Take 1 tablet (20 mg total) by mouth daily with  supper., Disp: 30 tablet, Rfl: 4  Allergies Patient has no known allergies.  Review of Systems Review of Systems - Oncology ROS negative   Physical Exam  Vitals Wt Readings from Last 3 Encounters:  01/28/18 260 lb (117.9 kg)  01/14/18 263 lb 11.2 oz (119.6 kg)  01/03/18 262 lb (118.8 kg)   Temp Readings from Last 3 Encounters:  01/28/18 98.1 F (36.7 C) (Oral)  01/14/18 98.1 F (36.7 C) (Oral)  01/03/18 (!) 97.5 F (36.4 C) (Oral)   BP Readings from Last 3 Encounters:  01/28/18 (!) 144/57  01/14/18 139/67  01/03/18 126/62   Pulse Readings from Last 3 Encounters:  01/28/18 70  01/14/18 72  01/03/18 70   Constitutional: Well-developed, well-nourished, and in no distress.   HENT: Head: Normocephalic and atraumatic.  Mouth/Throat: No oropharyngeal exudate. Mucosa moist. Eyes: Pupils are equal, round, and reactive to light. Conjunctivae are normal. No scleral icterus.  Neck: Normal range of motion. Neck supple. No JVD present.  Cardiovascular: Normal rate, regular rhythm and normal heart sounds.  Exam reveals no gallop and no friction rub.   No murmur heard. Pulmonary/Chest: Effort normal and breath sounds normal. No respiratory distress. No wheezes.No rales.  Abdominal: Soft. Bowel sounds are normal. No distension. There is no tenderness. There is no guarding.  Musculoskeletal: No edema or tenderness.  Lymphadenopathy: No cervical, axillary or supraclavicular adenopathy.  Neurological: Alert and oriented to person, place, and time. No cranial nerve deficit.  Skin: Skin is warm and dry. No rash noted. No erythema. No pallor.  Psychiatric: Affect and judgment normal.   Labs No visits with results within 3 Day(s) from this visit.  Latest known visit with results is:  Lab on 01/21/2018  Component Date Value Ref Range Status  . WBC 01/21/2018 3.3* 4.0 - 10.5 K/uL Final  . RBC 01/21/2018 4.84  4.22 - 5.81 MIL/uL Final  . Hemoglobin 01/21/2018 12.5* 13.0 - 17.0 g/dL  Final  . HCT 01/21/2018 39.3  39.0 - 52.0 % Final  . MCV 01/21/2018 81.2  80.0 - 100.0 fL Final  . MCH 01/21/2018 25.8* 26.0 - 34.0 pg Final  . MCHC 01/21/2018 31.8  30.0 - 36.0 g/dL Final  . RDW 01/21/2018 19.2* 11.5 - 15.5 % Final  . Platelets 01/21/2018 107* 150 - 400 K/uL Final   Comment: PLATELET COUNT CONFIRMED BY SMEAR SPECIMEN CHECKED FOR CLOTS Immature Platelet Fraction may be clinically indicated, consider ordering this additional test [id number]   . nRBC 01/21/2018 0.0  0.0 - 0.2 % Final  . Neutrophils Relative % 01/21/2018 57  % Final  . Neutro Abs 01/21/2018 1.9  1.7 - 7.7 K/uL Final  . Lymphocytes Relative 01/21/2018 27  % Final  . Lymphs Abs 01/21/2018 0.9  0.7 - 4.0 K/uL Final  . Monocytes Relative 01/21/2018 12  % Final  . Monocytes Absolute 01/21/2018 0.4  0.1 - 1.0 K/uL Final  . Eosinophils Relative 01/21/2018 3  % Final  . Eosinophils Absolute 01/21/2018 0.1  0.0 - 0.5 K/uL Final  . Basophils Relative 01/21/2018 1  % Final  . Basophils Absolute 01/21/2018 0.0  0.0 - 0.1 K/uL Final  . Immature Granulocytes 01/21/2018 0  % Final  . Abs Immature Granulocytes 01/21/2018 0.00  0.00 - 0.07 K/uL Final   Performed at [facility], [address] [place], [place] [address]  . Sodium 01/21/2018 136  135 - 145 mmol/L Final  . Potassium 01/21/2018 3.9  3.5 - 5.1 mmol/L Final  . Chloride 01/21/2018 105  98 - 111 mmol/L Final  . CO2 01/21/2018 24  22 - 32 mmol/L Final  . Glucose, Bld 01/21/2018 91  70 - 99 mg/dL Final  . BUN 01/21/2018 12  8 - 23 mg/dL Final  . Creatinine, Ser 01/21/2018 0.76  0.61 - 1.24 mg/dL Final  . Calcium 01/21/2018 8.8* 8.9 - 10.3 mg/dL Final  . Total Protein 01/21/2018 8.0  6.5 - 8.1 g/dL Final  . Albumin 01/21/2018 4.2  3.5 - 5.0 g/dL Final  . AST 01/21/2018 33  15 - 41 U/L Final  .  ALT 01/21/2018 40  0 - 44 U/L Final  . Alkaline Phosphatase 01/21/2018 91  38 - 126 U/L Final  . Total Bilirubin 01/21/2018 0.9  0.3 - 1.2 mg/dL Final  . GFR calc  non Af Amer 01/21/2018 >60  >60 mL/min Final  . GFR calc Af Amer 01/21/2018 >60  >60 mL/min Final  . Anion gap 01/21/2018 7  5 - 15 Final   Performed at [facility], [address] [place], [place] [address]  . LDH 01/21/2018 131  98 - 192 U/L Final   Performed at [facility], [address], [place], [place] [address]  . Ferritin 01/21/2018 69  24 - 336 ng/mL Final   Performed at [facility], [address], [place], [place] [address]  . Total Protein ELP 01/21/2018 7.3  6.0 - 8.5 g/dL Final  . Albumin ELP 01/21/2018 4.0  2.9 - 4.4 g/dL Final  . Alpha-1-Globulin 01/21/2018 0.2  0.0 - 0.4 g/dL Final  . Alpha-2-Globulin 01/21/2018 0.6  0.4 - 1.0 g/dL Final  . Beta Globulin 01/21/2018 1.2  0.7 - 1.3 g/dL Final  . Gamma Globulin 01/21/2018 1.3  0.4 - 1.8 g/dL Final  . M-Spike, % 01/21/2018 Not Observed  Not Observed g/dL Final  . SPE Interp. 01/21/2018 Comment   Final   Comment: (NOTE) The SPE pattern appears essentially unremarkable. Evidence of monoclonal protein is not apparent. Performed At: [facility] [place], [place] [id number] Rush Farmer MD NO:[id number]   . Comment 01/21/2018 Comment   Final   Comment: (NOTE) Protein electrophoresis scan will follow via computer, mail, or courier delivery.   Marland Kitchen GLOBULIN, TOTAL 01/21/2018 3.3  2.2 - 3.9 g/dL Corrected  . A/G Ratio 01/21/2018 1.2  0.7 - 1.7 Corrected  . Vitamin B-12 01/21/2018 845  180 - 914 pg/mL Final   Comment: (NOTE) This assay is not validated for testing neonatal or myeloproliferative syndrome specimens for Vitamin B12 levels. Performed at [facility], [address], [place], [place] [address]   . Folate 01/21/2018 4.5* >5.9 ng/mL Final   Performed at [facility], [address], [place], [place] [address]  . Iron 01/21/2018 76  45 - 182 ug/dL Final  . TIBC 01/21/2018 431  250 - 450 ug/dL Final  . Saturation Ratios 01/21/2018 18  17.9 - 39.5 % Final  . UIBC 01/21/2018 355  ug/dL Final    Performed at [facility], [address] [place], [place] [address]  . Recommendations-F5LEID: 01/21/2018 Comment   Final   Comment: (NOTE) Result:  Negative (no mutation found) Factor V Leiden is a specific mutation (R506Q) in the factor V gene that is associated with an increased risk of venous thrombosis. Factor V Leiden is more resistant to inactivation by activated protein C.  As a result, factor V persists in the circulation leading to a mild hyper- coagulable state.  The Leiden mutation accounts for 90% - 95% of APC resistance.  Factor V Leiden has been reported in patients with deep vein thrombosis, pulmonary embolus, central retinal vein occlusion, cerebral sinus thrombosis and hepatic vein thrombosis. Other risk factors to be considered in the workup for venous thrombosis include the G20210A mutation in the factor II (prothrombin) gene, protein S and C deficiency, and antithrombin deficiencies. Anticardiolipin antibody and lupus anticoagulant analysis may be appropriate for certain patients, as well as homocysteine levels. Contact your local [facility] for information on how to order additi  onal testing if desired. **Genetic counselors are available for health care providers to**  discuss results at [phone]-GENE [phone]). Methodology: DNA analysis of the Factor V gene was performed by allele-specific PCR. The diagnostic sensitivity and specificity is >99% for both. Molecular-based testing is highly accurate, but as in any laboratory test, diagnostic errors may occur. All test results must be combined with clinical information for the most accurate interpretation. This test was developed and its performance characteristics determined by [facility]. It has not been cleared or approved by the Food and Drug Administration. References: Voelkerding K (1996).  Clin Lab Med [phone]. Allison Quarry, PhD, [facility] Ruben Reason, PhD, [facility] Annetta Maw, M.S., PhD, [facility] Alfredo Bach, PhD, [facility] Norva Riffle, PhD, [facility] Earlean Polka PhD, [facility] Performed At: [facility] [address] [place], [place] [id number] Nechama Guard MD ZM:[id number]                          5   . Recommendations-PTGENE: 01/21/2018 Comment   Final   Comment: (NOTE) NEGATIVE No mutation identified. Comment: A point mutation (G20210A) in the factor II (prothrombin) gene is the second most common cause of inherited thrombophilia. The incidence of this mutation in the [place] Caucasian population is about 2% and in the [place] American population it is approximately 0.5%. This mutation is rare in the [place] and Native American population. Being heterozygous for a prothrombin mutation increases the risk for developing venous thrombosis about 2 to 3 times above the general population risk. Being homozygous for the prothrombin gene mutation increases the relative risk for venous thrombosis further, although it is not yet known how much further the risk is increased. In women heterozygous for the prothrombin gene mutation, the use of estrogen containing oral contraceptives increases the relative risk of venous thrombosis about 16 times and the risk of developing cerebral thrombosis is also significantly increased. In pregnancy the pr                          othrombin gene mutation increases risk for venous thrombosis and may increase risk for stillbirth, placental abruption, pre-eclampsia and fetal growth restriction. If the patient possesses two or more congenital or acquired thrombophilic risk factors, the risk for thrombosis may rise to more than the sum of the risk ratios for the individual mutations. This assay detects only the prothrombin G20210A mutation and does not measure genetic abnormalities elsewhere in the genome. Other thrombotic risk factors may be pursued through systematic clinical laboratory analysis. These factors include  the R506Q (Leiden) mutation in the Factor V gene, plasma homocysteine levels, as well as testing for deficiencies of antithrombin III, protein C and protein S. Genetic Counselors are available for health care providers to discuss results at [phone]-GENE [phone]). Methodology: DNA analysis of the Factor II gene was performed by PCR amplification followed by restriction analysis. The di                          agnostic sensitivity is >99% for both. All the tests must be combined with clinical information for the most accurate interpretation. Molecular-based testing is highly accurate, but as in any laboratory test, diagnostic errors may occur. This test was developed and its performance characteristics determined by [facility]. It has not been cleared or approved by the Food and Drug Administration. Poort SR, et al. Blood. 1996; 65:6812-7517. Varga EA.  Circulation. 2004; 470:J62-E36. Mervin Hack, et [place]; 19:700-703. Allison Quarry, PhD, [facility] Ruben Reason, PhD, [facility] Annetta Maw, M.S., PhD, [facility] Alfredo Bach, PhD, [facility] Norva Riffle, PhD, [facility] Earlean Polka, PhD, [facility] Performed At: [facility] [address] [place], [place] [id number] Nechama Guard MD TK:[id number]   . Beta-2 Glyco I IgG 01/21/2018 <9  0 - 20 GPI IgG units Final   Comment: (NOTE) The reference interval reflects a 3SD or 99th percentile interval, which is thought to represent a potentially clinically significant result in accordance with the International Consensus Statement on the classification criteria for definitive antiphospholipid syndrome (APS). J Thromb Haem 2006;4:295-306.   . Beta-2-Glycoprotein I IgM 01/21/2018 <9  0 - 32 GPI IgM units Final   Comment: (NOTE) The reference interval reflects a 3SD or 99th percentile interval, which is thought to represent a potentially clinically significant result in accordance with the International  Consensus Statement on the classification criteria for definitive antiphospholipid syndrome (APS). J Thromb Haem 2006;4:295-306. Performed At: [facility] [place], [place] [id number] Rush Farmer MD SW:[id number]   . Beta-2-Glycoprotein I IgA 01/21/2018 <9  0 - 25 GPI IgA units Final   Comment: (NOTE) The reference interval reflects a 3SD or 99th percentile interval, which is thought to represent a potentially clinically significant result in accordance with the International Consensus Statement on the classification criteria for definitive antiphospholipid syndrome (APS). J Thromb Haem 2006;4:295-306.   Marland Kitchen PTT Lupus Anticoagulant 01/21/2018 50.8  0.0 - 51.9 sec Final  . DRVVT 01/21/2018 133.0* 0.0 - 47.0 sec Final  . Lupus Anticoag Interp 01/21/2018 Comment:   Corrected   Comment: (NOTE) Results are consistent with the presence of a lupus anticoagulant. NOTE: Only persistent lupus anticoagulants are thought to be of clinical significance. For this reason, repeat testing in 12 or more weeks after an initial positive result should be considered to confirm or refute the presence of a lupus anticoagulant, depending on clinical presentation. Results of lupus anticoagulant tests may be falsely positive in the presence of certain anticoagulant therapies. Performed At: [facility] [place], [place] [id number] Rush Farmer MD VX:[id number]   . ANA Ab, IFA 01/21/2018 Negative   Final   Comment: (NOTE)                                     Negative   <1:80                                     Borderline  1:80                                     Positive   >1:80 Performed At: [facility] [place], [place] [id number] Rush Farmer MD JF:[id number]   . Transferrin 01/21/2018 309  180 - 329 mg/dL Final   Performed at [facility], [address] [place], [place] [address]  . dRVVT Mix 01/21/2018 72.0* 0.0 - 47.0 sec  Final   Comment: (NOTE) Performed At: [facility] [place], [place] [id number] Rush Farmer MD WI:[id number]   . dRVVT Confirm 01/21/2018 2.2* 0.8 - 1.2 ratio Final   Comment: (NOTE) Performed At:  [facility] [place], [place] [id number] Rush Farmer MD [id number]      Pathology No orders of the defined types were placed in this encounter.      Zoila Shutter MD

## 2018-01-29 ENCOUNTER — Encounter: Payer: Self-pay | Admitting: *Deleted

## 2018-01-30 ENCOUNTER — Other Ambulatory Visit: Payer: Self-pay | Admitting: Family Medicine

## 2018-02-06 ENCOUNTER — Encounter: Payer: Self-pay | Admitting: Internal Medicine

## 2018-02-06 ENCOUNTER — Ambulatory Visit (INDEPENDENT_AMBULATORY_CARE_PROVIDER_SITE_OTHER): Payer: Medicare Other | Admitting: Internal Medicine

## 2018-02-06 VITALS — BP 148/62 | HR 72 | Ht 72.0 in | Wt 264.0 lb

## 2018-02-06 DIAGNOSIS — D508 Other iron deficiency anemias: Secondary | ICD-10-CM

## 2018-02-06 DIAGNOSIS — K7460 Unspecified cirrhosis of liver: Secondary | ICD-10-CM

## 2018-02-06 DIAGNOSIS — I824Y1 Acute embolism and thrombosis of unspecified deep veins of right proximal lower extremity: Secondary | ICD-10-CM

## 2018-02-06 DIAGNOSIS — K900 Celiac disease: Secondary | ICD-10-CM | POA: Diagnosis not present

## 2018-02-06 DIAGNOSIS — K760 Fatty (change of) liver, not elsewhere classified: Secondary | ICD-10-CM

## 2018-02-06 NOTE — Progress Notes (Signed)
Subjective:    Patient ID: Joel Reed., male    DOB: November 25, 1950, 67 y.o.   MRN: [id number]  HPI Ismeal Heider is a 67 yo male with recent diagnosis of celiac disease, IDA, NAFLD cirrhosis, recent diagnosis DVT on Xarelto, hx of colon polyps, HTN, HL, DM2 here for follow-up.  He is here with his wife.  He saw Ellouise Newer, PA-C on 11/01/2017 for iron deficiency anemia and she arranged upper endoscopy and colonoscopy which I performed on 12/16/2017.    EGD revealed normal esophagus.  Diffuse moderate inflammation with erosions in the gastric body and antrum.  Biopsies taken.  Diffuse multiple erosions in the duodenal bulb and second portion which was biopsied.  The mucosa in the entire duodenum was flattened.  Pathology = stomach gastric with chronic gastritis.  Negative for H. pylori.  Small bowel active chronic duodenitis with moderate to marked villous architectural distortion, intraepithelial lymphocytosis and focal gastric foveolar metaplasia.  Most suggestive of peptic injury in the setting of celiac disease.  Colonoscopy to the terminal ileum showed a normal terminal ileum.  Multiple small and medium angiectasia is without bleeding in the transverse and ascending colon.  2 mm descending colon polyp found to be a tubular adenoma which was removed and small internal hemorrhoids.  Since procedures he received IV iron but unfortunately he had right lower extremity swelling and pain and was found to have a DVT.  He has established with hematology and is on Xarelto.  He had a hypercoagulable evaluation but has not received these results.  On my review it appears that lupus anticoagulant was positive.  He has been trying to follow a gluten-free diet though he admits to having cheated yesterday on Christmas day.  He and his wife ask about gluten-free options for diet and also stores with gluten-free products.  He has not had GI symptoms associated with his celiac disease including no abdominal  pain, diarrhea.  He has been on oral iron with vitamin C by way of orange juice and has had dark stool on iron but no melena or bleeding.  From a fatty liver disease standpoint.  He did have an evaluation at [facility] over 10 years ago with Dr. Drue Novel.  He was told he had fatty liver and he did have a history of elevated liver enzymes.  He was told later by imaging that he has cirrhosis.  I reviewed records and he had a liver biopsy in 2006 which showed grade 2 stage III steatohepatitis.  Most recently he denies jaundice, abdominal swelling, has had the right lower extremity swelling with his recent DVT, no confusion or bleeding.  He does not drink alcohol.   Review of Systems As per HPI, otherwise negative  Current Medications, Allergies, Past Medical History, Past Surgical History, Family History and Social History were reviewed in [employer] record.     Objective:   Physical Exam BP (!) 148/62   Pulse 72   Ht 6' (1.829 m)   Wt 264 lb (119.7 kg)   BMI 35.80 kg/m  Constitutional: Well-developed and well-nourished. No distress. HEENT: Normocephalic and atraumatic.  Conjunctivae are normal.  No scleral icterus. Neck: Neck supple. Trachea midline. Cardiovascular: Normal rate, regular rhythm and intact distal pulses. No M/R/G Pulmonary/chest: Effort normal and breath sounds normal. No wheezing, rales or rhonchi. Abdominal: Soft, obese, nontender, nondistended. Bowel sounds active throughout.  Extremities: no clubbing, cyanosis, edema 1+ RLE, trace LLE Neurological: Alert and oriented to person place  and time.  No asterixis Skin: Skin is warm and dry.  Rash b/l upper extremities (scaly, red papules) Psychiatric: Normal mood and affect. Behavior is normal.  Liver bx 2006: Surgical Pathology Consult Report Accession #: [id number]  Diagnosis: (Outside case, [id number], 3 slides, 06/25/02)  Liver, core biopsy. - Steatohepatitis (grade 2, early stage 3, Brunt  classification). - Mild parenchymal iron deposition (grade 1-2 of 4) (see comment).  Comment: There is macrovesicular steatosis affecting approximately 30% of liver parenchyma.There is ballooning of hepatocytes, hepatocyte nuclear glycogenization, and lobular foci of neutrophils.The portal tracts show mild to moderate infiltrates of lymphocytes, plasma cells and rare eosinophils.The findings are consistent with steatohepatitis.Special studies were performed at outside institute; trichrome stain shows perivenous/pericelluar fibrosis as well as periportal fibrosis with focal bridging.The iron stain shows mild to moderate liver parenchyma iron deposition (grade 1-2 of 4).With the patient' s normal iron saturation, negative findings for both C282Y and H63H mutations, we interpret this mild liver parenchymal iron deposition as secondary overload.  CBC    Component Value Date/Time   WBC 3.3 (L) 01/21/2018 1554   RBC 4.84 01/21/2018 1554   HGB 12.5 (L) 01/21/2018 1554   HCT 39.3 01/21/2018 1554   PLT 107 (L) 01/21/2018 1554   MCV 81.2 01/21/2018 1554   MCH 25.8 (L) 01/21/2018 1554   MCHC 31.8 01/21/2018 1554   RDW 19.2 (H) 01/21/2018 1554   LYMPHSABS 0.9 01/21/2018 1554   MONOABS 0.4 01/21/2018 1554   EOSABS 0.1 01/21/2018 1554   BASOSABS 0.0 01/21/2018 1554   CMP     Component Value Date/Time   NA 136 01/21/2018 1554   K 3.9 01/21/2018 1554   CL 105 01/21/2018 1554   CO2 24 01/21/2018 1554   GLUCOSE 91 01/21/2018 1554   BUN 12 01/21/2018 1554   CREATININE 0.76 01/21/2018 1554   CREATININE 0.73 10/01/2017 0802   CALCIUM 8.8 (L) 01/21/2018 1554   PROT 8.0 01/21/2018 1554   ALBUMIN 4.2 01/21/2018 1554   AST 33 01/21/2018 1554   ALT 40 01/21/2018 1554   ALKPHOS 91 01/21/2018 1554   BILITOT 0.9 01/21/2018 1554   GFRNONAA >60 01/21/2018 1554   GFRNONAA 95 03/29/2017 1602   GFRAA >60 01/21/2018 1554   GFRAA 110 03/29/2017 1602   Iron/TIBC/Ferritin/ %Sat      Component Value Date/Time   IRON 76 01/21/2018 1554   TIBC 431 01/21/2018 1554   FERRITIN 69 01/21/2018 1554   IRONPCTSAT 18 01/21/2018 1554   TTG > 100   Lab Results  Component Value Date   VITAMINB12 845 01/21/2018        Assessment & Plan:  67 yo male with recent diagnosis of celiac disease, IDA, NAFLD cirrhosis, recent diagnosis DVT on Xarelto, hx of colon polyps, HTN, HL, DM2 here for follow-up.   1. Celiac disease --we discussed at length today the diagnosis of celiac disease.  I am confident this diagnosis is definitive based on small bowel biopsies but also very positive TTG (antibody level greater than 100).  I recommended strict gluten-free diet and referred him to http://rios.biz/ as this website has very good, evidence-based resources.  We discussed how he does not have GI/intestinal manifestations other than iron deficiency and this is quite common in adults (for example he does not have diarrhea).  It seems that he does have rash related to celiac, the iron deficiency as mentioned.  I recommended that he consider other grocery store such as [employer], [employer] or [employer].  I  also recommended that the celiac diet/gluten-free diet be very strict. --Gluten-free diet --Recheck TTG at follow-up  2.  IDA --he received IV iron and is feeling better.  Ferritin levels have improved.  He will follow with me for this issue but also hematology.  I expect he will need additional IV iron in the future but hopefully once the small bowel is healed with gluten-free diet he will be able to absorb iron adequately.  He will continue oral iron daily for now, 325 mg  3.  Fatty liver disease cirrhosis --he had moderate fibrosis in 2006 but now cirrhosis by imaging.  We discussed this also at length today.  We discussed risk factor modification which is weight loss, exercise and controlling hypertension, hyperlipidemia and diabetes.  He likely has mild portal hypertension as evidenced by  splenomegaly and mild thrombocytopenia.  Fortunately no varices at recent endoscopy.  No history of ascites or encephalopathy.  He does need [place] screening --Risk factor modification as above, for him he should work on weight reduction and with his primary care for hypertension, hyperlipidemia and diabetes management --I did not recommend vitamin E therapy given his diabetes --HCC screening ultrasound recommended and he is aware this is every 6 months --No varices at EGD, repeat in 2 to 3 years --Overall low-sodium diet --Annual vaccinations  3.  History of adenomatous colon polyp --repeat colonoscopy in 5 years  4.  Right lower extremity DVT --on Xarelto and following with hematology.  40 minutes spent with the patient today. Greater than 50% was spent in counseling and coordination of care with the patient

## 2018-02-06 NOTE — Patient Instructions (Addendum)
You have been scheduled for an abdominal ultrasound at [facility] (1st floor of hospital) on 02/13/18 at 9:30 am. Please arrive 15 minutes prior to your appointment for registration. Make certain not to have anything to eat or drink 6 hours prior to your appointment. Should you need to reschedule your appointment, please contact radiology at [phone]. This test typically takes about 30 minutes to perform.  Whenever you get labs completed next time, please consider having an INR drawn.  Please follow up with Dr Hilarie Fredrickson in the office in 6 months.  Continue Pantoprazole 40 mg daily  Continue oral iron 325 mg daily  Follow a strict gluten free diet.  If you are age 55 or older, your body mass index should be between 23-30. Your Body mass index is 35.8 kg/m. If this is out of the aforementioned range listed, please consider follow up with your Primary Care Provider.  If you are age 90 or younger, your body mass index should be between 19-25. Your Body mass index is 35.8 kg/m. If this is out of the aformentioned range listed, please consider follow up with your Primary Care Provider.    Gluten-Free Diet for Celiac Disease, Adult  The gluten-free diet includes all foods that do not contain gluten. Gluten is a protein that is found in wheat, rye, barley, and some other grains. Following the gluten-free diet is the only treatment for people with celiac disease. It helps to prevent damage to the intestines and improves or eliminates the symptoms of celiac disease. Following the gluten-free diet requires some planning. It can be challenging at first, but it gets easier with time and practice. There are more gluten-free options available today than ever before. If you need help finding gluten-free foods or if you have questions, talk with your diet and nutrition specialist (registered dietitian) or your health care provider. What do I need to know about a gluten-free diet?  All fruits,  vegetables, and meats are safe to eat and do not contain gluten.  When grocery shopping, start by shopping in the produce, meat, and dairy sections. These sections are more likely to contain gluten-free foods. Then move to the aisles that contain packaged foods if you need to.  Read all food labels. Gluten is often added to foods. Always check the ingredient list and look for warnings, such as "may contain gluten."  Talk with your dietitian or health care provider before taking a gluten-free multivitamin or mineral supplement.  Be aware of gluten-free foods having contact with foods that contain gluten (cross-contamination). This can happen at home and with any processed foods. ? Talk with your health care provider or dietitian about how to reduce the risk of cross-contamination in your home. ? If you have questions about how a food is processed, ask the manufacturer. What key words help to identify gluten? Foods that list any of these key words on the label usually contain gluten:  Wheat, flour, enriched flour, bromated flour, white flour, durum flour, graham flour, phosphated flour, self-rising flour, semolina, farina, barley (malt), rye, and oats.  Starch, dextrin, modified food starch, or cereal.  Thickening, fillers, or emulsifiers.  Malt flavoring, malt extract, or malt syrup.  Hydrolyzed vegetable protein. In the [place] packaged foods that are gluten-free are required to be labeled "GF." These foods should be easy to identify and are safe to eat. In the [place] food companies are also required to list common food allergens, including wheat, on their labels. Recommended foods Grains  Amaranth, bean flours, 100% buckwheat flour, corn, millet, nut flours or nut meals, GF oats, quinoa, rice, sorghum, teff, rice wafers, pure cornmeal tortillas, popcorn, and hot cereals made from cornmeal. Hominy, rice, wild rice. Some Asian rice noodles or bean noodles. Arrowroot starch, corn bran, corn  flour, corn germ, cornmeal, corn starch, potato flour, potato starch flour, and rice bran. Plain, brown, and sweet rice flours. Rice polish, soy flour, and tapioca starch. Vegetables  All plain fresh, frozen, and canned vegetables. Fruits  All plain fresh, frozen, canned, and dried fruits, and 100% fruit juices. Meats and other protein foods  All fresh beef, pork, poultry, fish, seafood, and eggs. Fish canned in water, oil, brine, or vegetable broth. Plain nuts and seeds, peanut butter. Some lunch meat and some frankfurters. Dried beans, dried peas, and lentils. Dairy  Fresh plain, dry, evaporated, or condensed milk. Cream, butter, sour cream, whipping cream, and most yogurts. Unprocessed cheese, most processed cheeses, some cottage cheese, some cream cheeses. Beverages  Coffee, tea, most herbal teas. Carbonated beverages and some root beers. Wine, sake, and distilled spirits, such as gin, vodka, and whiskey. Most hard ciders. Fats and oils  Butter, margarine, vegetable oil, hydrogenated butter, olive oil, shortening, lard, cream, and some mayonnaise. Some commercial salad dressings. Olives. Sweets and desserts  Sugar, honey, some syrups, molasses, jelly, and jam. Plain hard candy, marshmallows, and gumdrops. Pure cocoa powder. Plain chocolate. Custard and some pudding mixes. Gelatin desserts, sorbets, frozen ice pops, and sherbet. Cake, cookies, and other desserts prepared with allowed flours. Some commercial ice creams. Cornstarch, tapioca, and rice puddings. Seasoning and other foods  Some canned or frozen soups. Monosodium glutamate (MSG). Cider, rice, and wine vinegar. Baking soda and baking powder. Cream of tartar. Baking and nutritional yeast. Certain soy sauces made without wheat (ask your dietitian about specific brands that are allowed). Nuts, coconut, and chocolate. Salt, pepper, herbs, spices, flavoring extracts, imitation or artificial flavorings, natural flavorings, and food  colorings. Some medicines and supplements. Some lip glosses and other cosmetics. Rice syrups. The items listed may not be a complete list. Talk with your dietitian about what dietary choices are best for you. Foods to avoid Grains  Barley, bran, bulgur, couscous, cracked wheat, Flowella, farro, graham, malt, matzo, semolina, wheat germ, and all wheat and rye cereals including spelt and kamut. Cereals containing malt as a flavoring, such as rice cereal. Noodles, spaghetti, macaroni, most packaged rice mixes, and all mixes containing wheat, rye, barley, or triticale. Vegetables  Most creamed vegetables and most vegetables canned in sauces. Some commercially prepared vegetables and salads. Fruits  Thickened or prepared fruits and some pie fillings. Some fruit snacks and fruit roll-ups. Meats and other protein foods  Any meat or meat alternative containing wheat, rye, barley, or gluten stabilizers. These are often marinated or packaged meats and lunch meats. Bread-containing products, such as Swiss steak, croquettes, meatballs, and meatloaf. Most tuna canned in vegetable broth and [place] with hydrolyzed vegetable protein (HVP) injected as part of the basting. Seitan. Imitation fish. Eggs in sauces made from ingredients to avoid. Dairy  Commercial chocolate milk drinks and malted milk. Some non-dairy creamers. Any cheese product containing ingredients to avoid. Beverages  Certain cereal beverages. Beer, ale, malted milk, and some root beers. Some hard ciders. Some instant flavored coffees. Some herbal teas made with barley or with barley malt added. Fats and oils  Some commercial salad dressings. Sour cream containing modified food starch. Sweets and desserts  Some toffees. Chocolate-coated nuts (may  be rolled in wheat flour) and some commercial candies and candy bars. Most cakes, cookies, donuts, pastries, and other baked goods. Some commercial ice cream. Ice cream cones. Commercially prepared  mixes for cakes, cookies, and other desserts. Bread pudding and other puddings thickened with flour. Products containing brown rice syrup made with barley malt enzyme. Desserts and sweets made with malt flavoring. Seasoning and other foods  Some curry powders, some dry seasoning mixes, some gravy extracts, some meat sauces, some ketchups, some prepared mustards, and horseradish. Certain soy sauces. Malt vinegar. Bouillon and bouillon cubes that contain HVP. Some chip dips, and some chewing gum. Yeast extract. Brewer's yeast. Caramel color. Some medicines and supplements. Some lip glosses and other cosmetics. The items listed may not be a complete list. Talk with your dietitian about what dietary choices are best for you. Summary  Gluten is a protein that is found in wheat, rye, barley, and some other grains. The gluten-free diet includes all foods that do not contain gluten.  If you need help finding gluten-free foods or if you have questions, talk with your diet and nutrition specialist (registered dietitian) or your health care provider.  Read all food labels. Gluten is often added to foods. Always check the ingredient list and look for warnings, such as "may contain gluten." This information is not intended to replace advice given to you by your health care provider. Make sure you discuss any questions you have with your health care provider. Document Released: 01/29/2005 Document Revised: 11/14/2015 Document Reviewed: 11/14/2015 Elsevier Interactive Patient Education  2019 [employer].

## 2018-02-13 ENCOUNTER — Ambulatory Visit (HOSPITAL_COMMUNITY): Payer: Medicare Other

## 2018-02-17 ENCOUNTER — Ambulatory Visit (HOSPITAL_COMMUNITY)
Admission: RE | Admit: 2018-02-17 | Discharge: 2018-02-17 | Disposition: A | Discharge status: Home / Self Care | Payer: Medicare Other | Source: Ambulatory Visit | Attending: Internal Medicine | Admitting: Internal Medicine

## 2018-02-17 DIAGNOSIS — K760 Fatty (change of) liver, not elsewhere classified: Secondary | ICD-10-CM | POA: Diagnosis not present

## 2018-02-17 DIAGNOSIS — K7689 Other specified diseases of liver: Secondary | ICD-10-CM | POA: Diagnosis not present

## 2018-02-17 IMAGING — US US ABDOMEN COMPLETE
1 series · 14 of 25 positions shown · non-contrast
Comparison: CT scan [DATE].

CLINICAL DATA: Cirrhosis.  Hepatoma screening.

EXAM:
ABDOMEN ULTRASOUND COMPLETE

[Series 1: us abdomen complete · 14 of 111 slices shown]
[im 1/111]
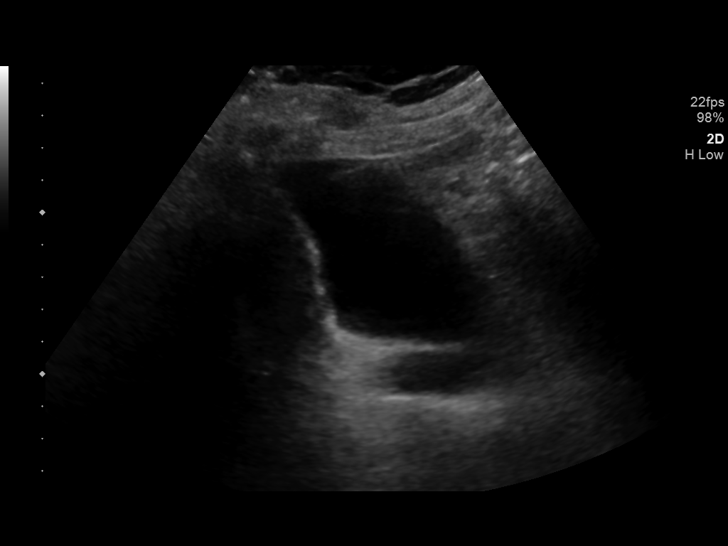
[im 10/111]
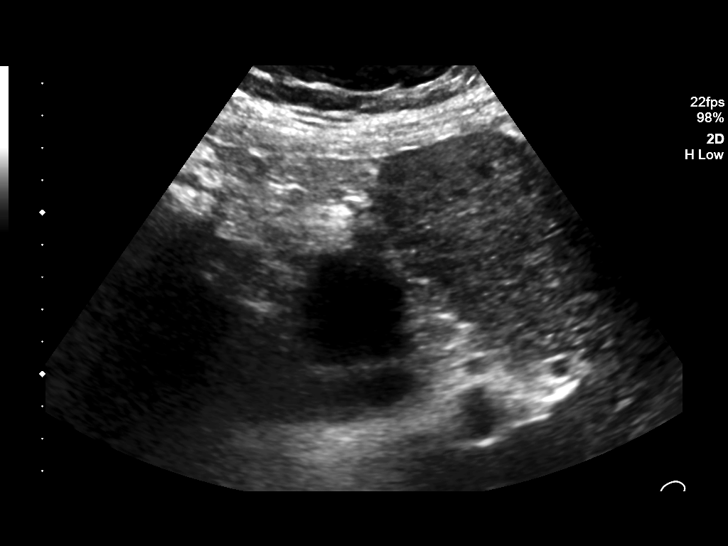
[im 19/111]
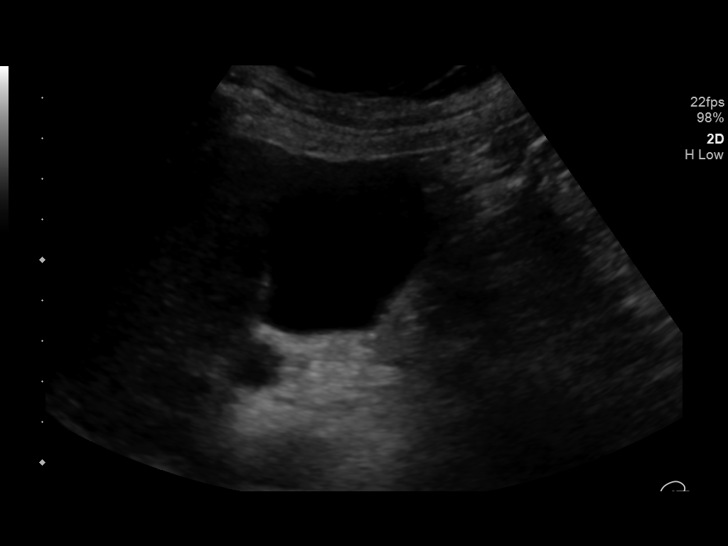
[im 28/111]
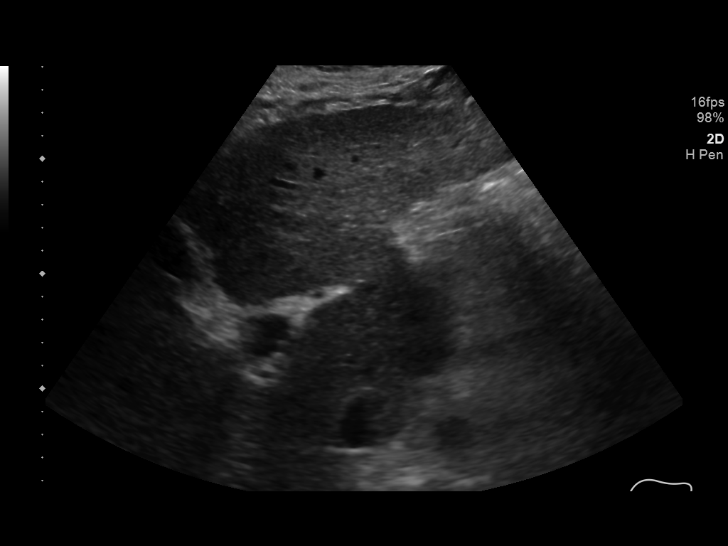
[im 37/111]
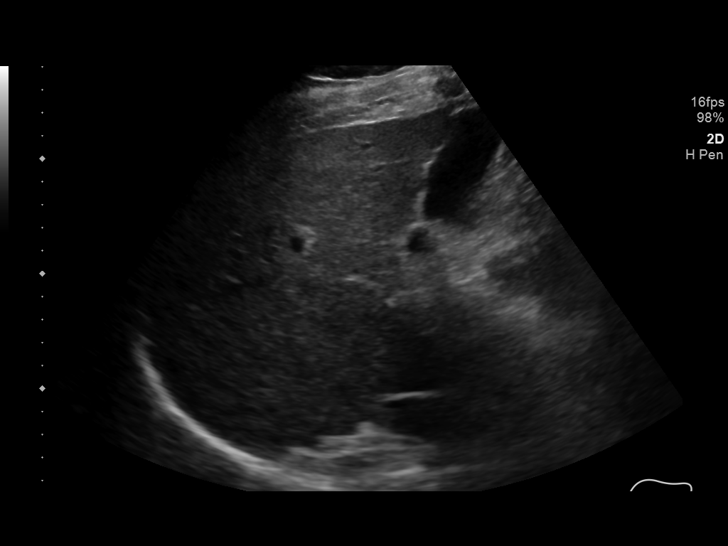
[im 42/111]
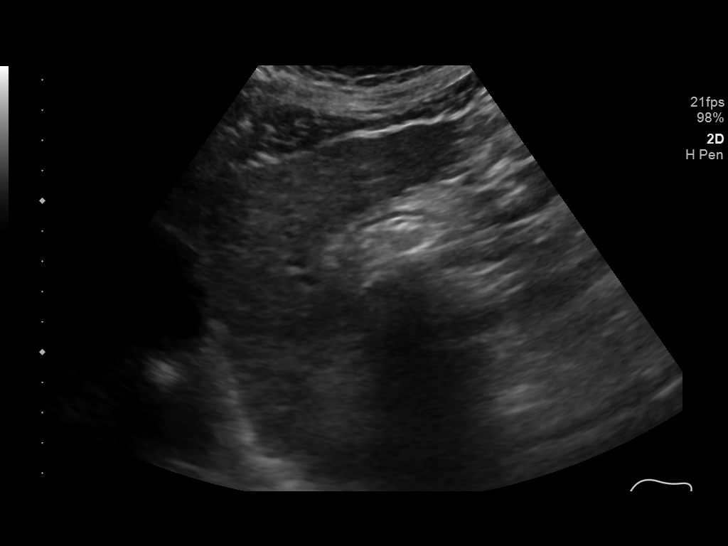
[im 51/111]
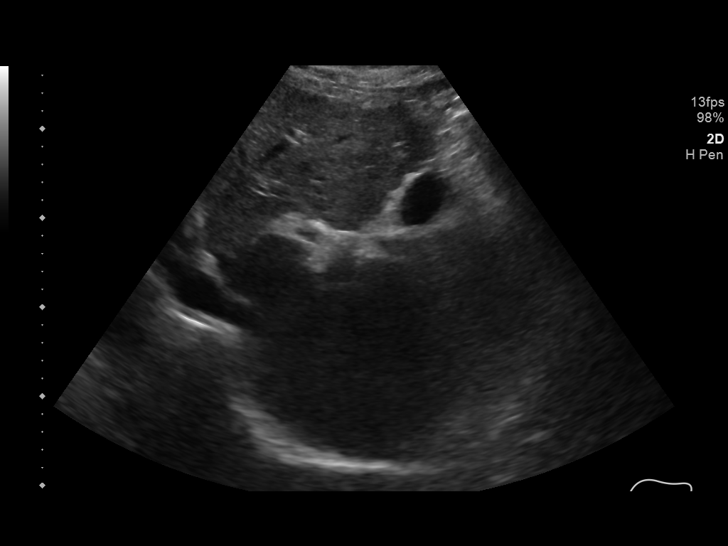
[im 60/111]
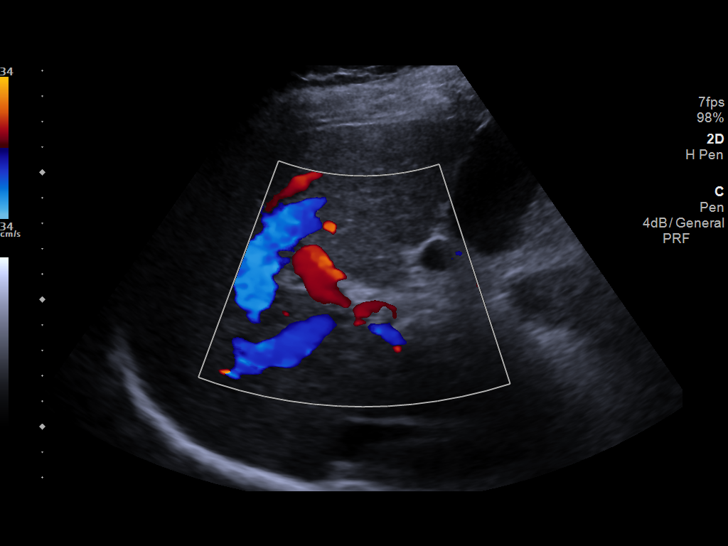
[im 69/111]
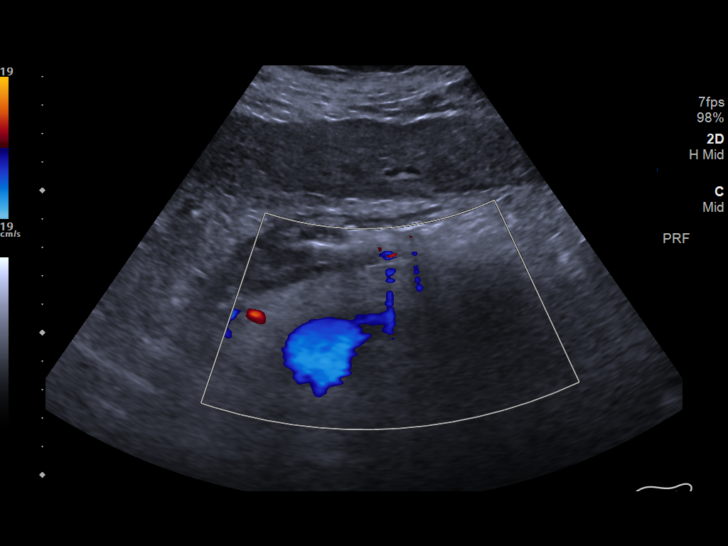
[im 74/111]
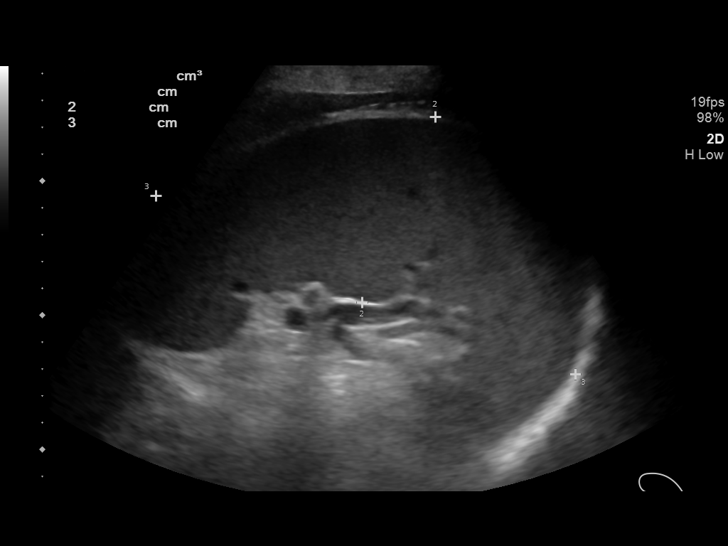
[im 83/111]
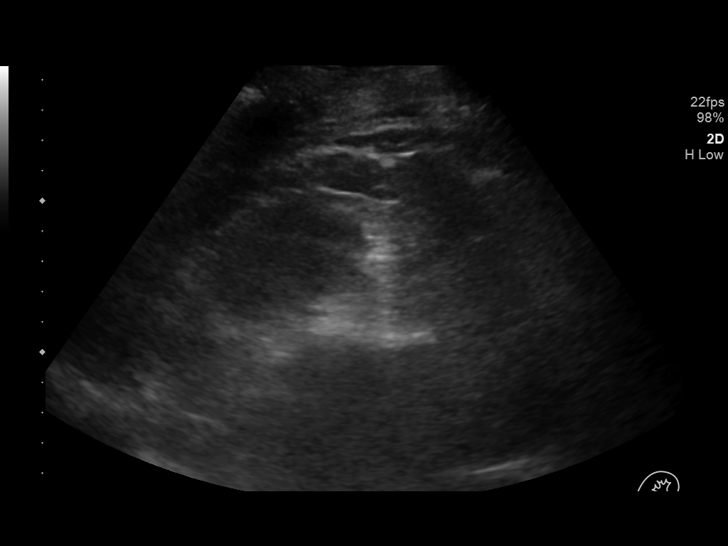
[im 92/111]
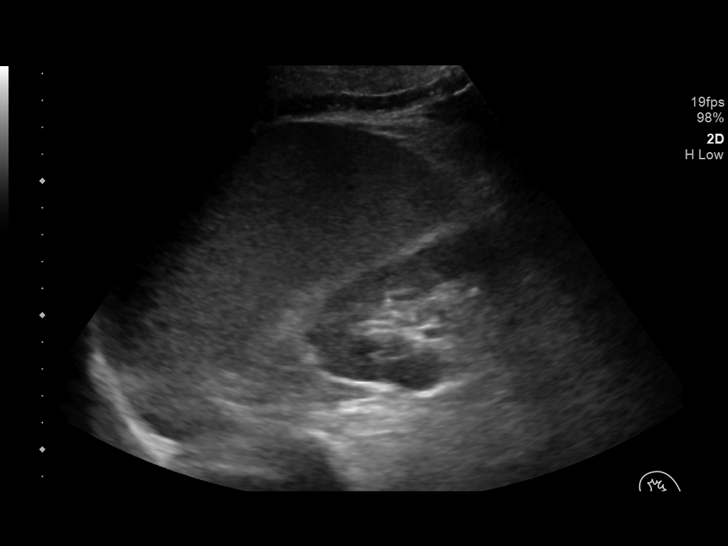
[im 101/111]
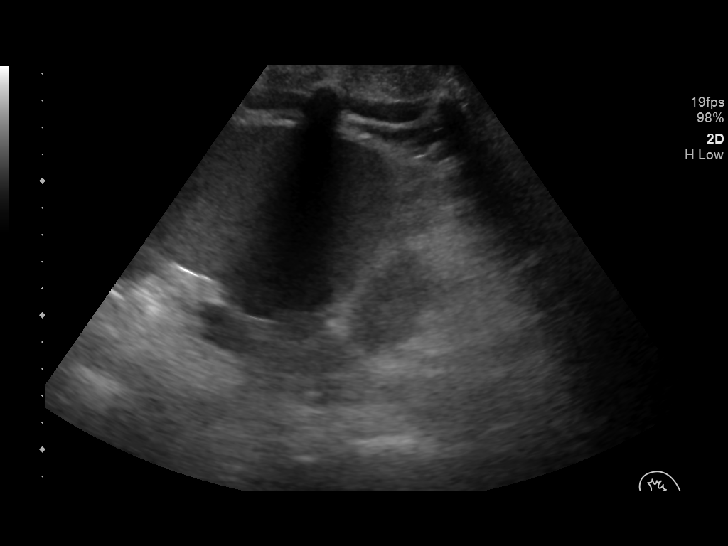
[im 111/111]
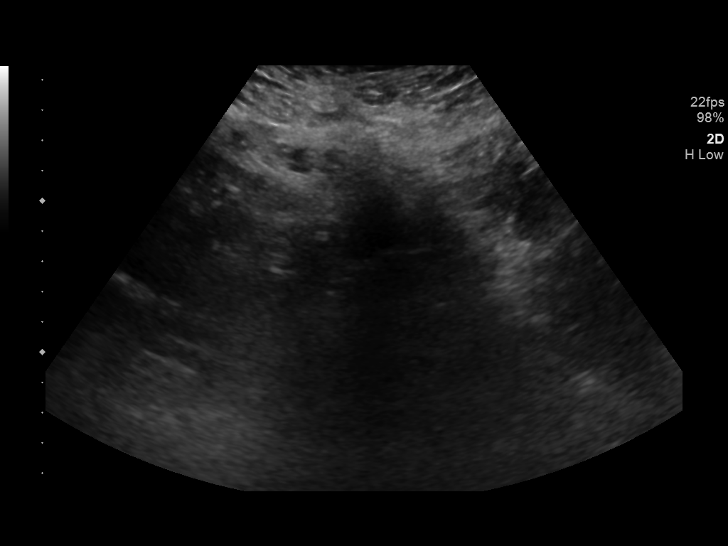

[14 of 25 positions shown; findings below may reference images not displayed]

FINDINGS: Gallbladder: No gallstones or gallbladder wall thickening. No
pericholecystic fluid. The sonographer reports no sonographic
Murphy's sign.

Common bile duct: Diameter: 4 mm

Liver: Heterogeneous echotexture with nodular contour. No focal
lesion identified within the liver parenchyma. Portal vein is patent
on color Doppler imaging with normal direction of blood flow towards
the liver.

IVC: Partially obscured by bowel gas.

Pancreas: Partially obscured by bowel gas.

Spleen: 17.4 cm craniocaudal length, enlarged.

Right Kidney: Length: 13.4 cm. Small cystic lesion noted lower pole
measuring 2.6 cm. 1.7 cm cystic lesion was seen at this location on
CT scan previously.. No hydronephrosis.

Left Kidney: Length: 14.3. Echogenicity within normal limits. No
mass or hydronephrosis visualized.

Abdominal aorta: No aneurysm visualized.

Other findings: None.
IMPRESSION: 1. Nodular hepatic contour compatible with cirrhosis. No focal mass
lesion evident within the liver parenchyma.
2. Splenomegaly.

## 2018-03-12 ENCOUNTER — Ambulatory Visit (INDEPENDENT_AMBULATORY_CARE_PROVIDER_SITE_OTHER): Payer: Medicare Other | Admitting: Family Medicine

## 2018-03-12 ENCOUNTER — Encounter: Payer: Self-pay | Admitting: Family Medicine

## 2018-03-12 VITALS — BP 130/78 | HR 68 | Temp 98.9°F | Resp 15 | Ht 72.0 in | Wt 254.5 lb

## 2018-03-12 DIAGNOSIS — R51 Headache: Secondary | ICD-10-CM | POA: Diagnosis not present

## 2018-03-12 DIAGNOSIS — R040 Epistaxis: Secondary | ICD-10-CM | POA: Diagnosis not present

## 2018-03-12 DIAGNOSIS — R509 Fever, unspecified: Secondary | ICD-10-CM | POA: Diagnosis not present

## 2018-03-12 DIAGNOSIS — J0140 Acute pansinusitis, unspecified: Secondary | ICD-10-CM

## 2018-03-12 DIAGNOSIS — Z760 Encounter for issue of repeat prescription: Secondary | ICD-10-CM

## 2018-03-12 LAB — INFLUENZA A AND B AG, IMMUNOASSAY
INFLUENZA A ANTIGEN: NOT DETECTED
INFLUENZA B ANTIGEN: NOT DETECTED

## 2018-03-12 MED ORDER — AMOXICILLIN-POT CLAVULANATE 875-125 MG PO TABS: 1.0000 | ORAL_TABLET | Freq: Two times a day (BID) | ORAL | 0 refills | Status: AC

## 2018-03-12 MED ORDER — PANTOPRAZOLE SODIUM 40 MG PO TBEC: 40.0000 mg | DELAYED_RELEASE_TABLET | Freq: Every day | ORAL | 3 refills | Status: DC

## 2018-03-12 MED ORDER — CETIRIZINE HCL 10 MG PO TBDP: 10.0000 mg | ORAL_TABLET | Freq: Every day | ORAL | 0 refills | Status: DC

## 2018-03-12 MED ORDER — FLUTICASONE PROPIONATE 50 MCG/ACT NA SUSP: 2.0000 | Freq: Every day | NASAL | 2 refills | Status: DC

## 2018-03-12 NOTE — Progress Notes (Signed)
Patient ID: Joel Gums., male    DOB: 1950-08-23, 68 y.o.   MRN: [id number]  PCP: Susy Frizzle, MD  Chief Complaint  Patient presents with  . Nasal Congestion    Has c/o nasal congestion, cold chills, headache, fever, and also has c/o nose bleeds.     Subjective:   Joel Thelen. is a 68 y.o. male, presents to clinic with CC of nasal discharge and nose bleeds - nasal sx for a few weeks, says he's constantly wiping nose, it's raw, blowing nose, causing some nose bleeds.  Nose bleeds last a few minutes and he gets them to resolve by pinching nose and often times twisting and pushing tissue up nostril. PT's nasal/URI sx yesterday acutely worse,he feels "zapped" no energy,cold chills, subjective fever, additionally has associated dry hacking cough, Nose obstructed alternating sides, sneezing, with HA worsening to the right temple.   No st, myalgias, CP, SOB, wheeze.       Patient Active Problem List   Diagnosis Date Noted  . Celiac disease   . Gastritis and duodenitis   . PVD (peripheral vascular disease) ([place])   . Type II diabetes mellitus, uncontrolled ([place]) 11/08/2016  . HTN (hypertension), benign 11/08/2016  . HLD (hyperlipidemia) 11/08/2016  . History of nephrolithiasis 11/08/2016     Prior to Admission medications   Medication Sig Start Date End Date Taking? Authorizing Provider  amLODipine (NORVASC) 5 MG tablet TAKE 1 TABLET BY MOUTH ONCE DAILY 07/15/17  Yes Susy Frizzle, MD  Blood Glucose Monitoring Suppl (CONTOUR NEXT MONITOR) w/Device KIT 1 strip by Does not apply route daily. 08/19/17  Yes Susy Frizzle, MD  ferrous sulfate 325 (65 FE) MG EC tablet Take 325 mg by mouth daily.   Yes [provider]  glimepiride (AMARYL) 2 MG tablet Take 2 mg by mouth daily with breakfast.   Yes [provider]  glucose blood (CONTOUR NEXT TEST) test strip Check bs daily DX: E11.9 10/01/17  Yes Susy Frizzle, MD  Lancets Misc. MISC Use with daily  bs check 10/01/17  Yes Susy Frizzle, MD  losartan (COZAAR) 100 MG tablet TAKE 1 TABLET BY MOUTH EVERY DAY 12/24/17  Yes Susy Frizzle, MD  metFORMIN (GLUCOPHAGE) 500 MG tablet TAKE 1 TABLET BY MOUTH 2 TIMES DAILY WITH A MEAL. 12/24/17  Yes Susy Frizzle, MD  metoprolol succinate (TOPROL-XL) 25 MG 24 hr tablet TAKE 1 TABLET BY MOUTH EVERY DAY 01/06/18  Yes Susy Frizzle, MD  OVER THE COUNTER MEDICATION B 12 , one tablet daily.   Yes [provider]  OVER THE COUNTER MEDICATION Vitamin D 3, 1200 mg. One time daily.   Yes [provider]  pantoprazole (PROTONIX) 40 MG tablet Take 1 tablet (40 mg total) by mouth daily. 12/16/17  Yes Pyrtle, Lajuan Lines, MD  XARELTO 20 MG TABS tablet TAKE 1 TABLET BY MOUTH DAILY WITH SUPPER. 01/30/18  Yes Susy Frizzle, MD     Allergies  Allergen Reactions  . Gluten Meal      Family History  Problem Relation Age of Onset  . Esophageal cancer Father   . Breast cancer Sister        bone cancer  . Heart attack Brother 37  . Cancer - Other Sister        cervical?  . Colon cancer Neg Hx   . Rectal cancer Neg Hx   . Stomach cancer Neg Hx  Social History   Socioeconomic History  . Marital status: Married    Spouse name: Not on file  . Number of children: Not on file  . Years of education: Not on file  . Highest education level: Not on file  Occupational History  . Not on file  Social Needs  . Financial resource strain: Not on file  . Food insecurity:    Worry: Not on file    Inability: Not on file  . Transportation needs:    Medical: Not on file    Non-medical: Not on file  Tobacco Use  . Smoking status: Never Smoker  . Smokeless tobacco: Never Used  Substance and Sexual Activity  . Alcohol use: No  . Drug use: No  . Sexual activity: Not on file  Lifestyle  . Physical activity:    Days per week: Not on file    Minutes per session: Not on file  . Stress: Not on file  Relationships  . Social  connections:    Talks on phone: Not on file    Gets together: Not on file    Attends religious service: Not on file    Active member of club or organization: Not on file    Attends meetings of clubs or organizations: Not on file    Relationship status: Not on file  . Intimate partner violence:    Fear of current or ex partner: Not on file    Emotionally abused: Not on file    Physically abused: Not on file    Forced sexual activity: Not on file  Other Topics Concern  . Not on file  Social History Narrative  . Not on file     Review of Systems  Constitutional: Negative.   HENT: Negative.   Eyes: Negative.   Respiratory: Negative.   Cardiovascular: Negative.   Gastrointestinal: Negative.   Endocrine: Negative.   Genitourinary: Negative.   Musculoskeletal: Negative.   Skin: Negative.   Allergic/Immunologic: Negative.   Neurological: Negative.   Hematological: Negative.   Psychiatric/Behavioral: Negative.   All other systems reviewed and are negative.      Objective:    Vitals:   03/12/18 1548  BP: 130/78  Pulse: 68  Resp: 15  Temp: 98.9 F (37.2 C)  TempSrc: Oral  SpO2: 98%  Weight: 254 lb 8 oz (115.4 kg)  Height: 6' (1.829 m)      Physical Exam Vitals signs and nursing note reviewed.  Constitutional:      General: He is not in acute distress.    Appearance: Normal appearance. He is well-developed. He is not ill-appearing, toxic-appearing or diaphoretic.  HENT:     Head: Normocephalic and atraumatic.     Jaw: No trismus.     Right Ear: Tympanic membrane, ear canal and external ear normal.     Left Ear: Tympanic membrane, ear canal and external ear normal.     Nose: Mucosal edema, congestion and rhinorrhea present. Rhinorrhea is purulent.     Right Nostril: Occlusion present. No epistaxis.     Left Nostril: No epistaxis.     Right Sinus: No maxillary sinus tenderness or frontal sinus tenderness.     Left Sinus: No maxillary sinus tenderness or frontal  sinus tenderness.     Comments: Right nare possible polyp     Mouth/Throat:     Mouth: Mucous membranes are moist. Mucous membranes are not pale, not dry and not cyanotic.     Pharynx: Uvula midline. Posterior oropharyngeal  erythema present. No oropharyngeal exudate or uvula swelling.     Tonsils: No tonsillar exudate or tonsillar abscesses.  Eyes:     General: Lids are normal.        Right eye: No discharge.        Left eye: No discharge.     Conjunctiva/sclera: Conjunctivae normal.     Pupils: Pupils are equal, round, and reactive to light.  Neck:     Musculoskeletal: Normal range of motion and neck supple.     Trachea: Trachea and phonation normal. No tracheal deviation.  Cardiovascular:     Rate and Rhythm: Normal rate and regular rhythm.     Pulses:          Radial pulses are 2+ on the right side and 2+ on the left side.     Heart sounds: Normal heart sounds. No murmur. No friction rub. No gallop.   Pulmonary:     Effort: Pulmonary effort is normal. No tachypnea, accessory muscle usage or respiratory distress.     Breath sounds: Normal breath sounds. No stridor. No decreased breath sounds, wheezing, rhonchi or rales.  Abdominal:     General: Bowel sounds are normal. There is no distension.     Palpations: Abdomen is soft.     Tenderness: There is no abdominal tenderness.  Musculoskeletal: Normal range of motion.  Lymphadenopathy:     Cervical: No cervical adenopathy.  Skin:    General: Skin is warm and dry.     Capillary Refill: Capillary refill takes less than 2 seconds.     Coloration: Skin is not pale.     Findings: No rash.     Nails: There is no clubbing.   Neurological:     Mental Status: He is alert and oriented to person, place, and time.     Motor: No abnormal muscle tone.     Coordination: Coordination normal.     Gait: Gait normal.  Psychiatric:        Speech: Speech normal.        Behavior: Behavior normal. Behavior is cooperative.             Assessment & Plan:    Nasal sx x 1-2 weeks acutely worsening yesterday - appears consistent with ABS, flu negative -(flu done for constitutional sx/acute onset) He also has nose bleeds last less than 5 min, started xarelto 2 months ago - no current/active bleed now, pt denies other spontaneous bleeding or bruising - reviewed tx for nose bleeds and ER visit precautions.  Pt encouraged to f/up if not improving with abx and allergy/rhinosinusitis tx. Possible polyp in right nare but also could be discharge - very tight and difficult to visualize, would want to recheck if not better and would refer to ENT if findings of polyp or recurrent nose bleeds.    ICD-10-CM   1. Acute pansinusitis, recurrence not specified J01.40 amoxicillin-clavulanate (AUGMENTIN) 875-125 MG tablet    fluticasone (FLONASE) 50 MCG/ACT nasal spray    Cetirizine HCl (ZYRTEC ALLERGY) 10 MG TBDP  2. Fever, unspecified fever cause R50.9 Influenza A and B Ag, Immunoassay   secondary to URI/ABS  3. Medication refill Z76.0    refill of protonix - pt changed pharmacy  4. Epistaxis R04.0    in setting of xarelto for DVT and URI/sinusitis sx - no active bleed now, reviewed tx, follow up, ER precautions       Delsa Grana, PA-C 03/15/18 12:13 AM

## 2018-03-15 ENCOUNTER — Encounter: Payer: Self-pay | Admitting: Family Medicine

## 2018-03-20 ENCOUNTER — Other Ambulatory Visit: Payer: Self-pay | Admitting: Family Medicine

## 2018-03-31 ENCOUNTER — Other Ambulatory Visit: Payer: Self-pay | Admitting: Family Medicine

## 2018-04-07 ENCOUNTER — Other Ambulatory Visit: Payer: Self-pay | Admitting: Family Medicine

## 2018-05-03 ENCOUNTER — Other Ambulatory Visit: Payer: Self-pay | Admitting: Family Medicine

## 2018-06-30 ENCOUNTER — Other Ambulatory Visit: Payer: Self-pay | Admitting: Family Medicine

## 2018-07-02 ENCOUNTER — Inpatient Hospital Stay (HOSPITAL_COMMUNITY)
Admission: EM | Admit: 2018-07-02 | Discharge: 2018-07-07 | DRG: 872 | Disposition: A | Discharge status: Home Health | Payer: Medicare Other | Attending: Internal Medicine | Admitting: Internal Medicine

## 2018-07-02 ENCOUNTER — Other Ambulatory Visit: Payer: Self-pay

## 2018-07-02 ENCOUNTER — Ambulatory Visit (HOSPITAL_COMMUNITY): Payer: BLUE CROSS/BLUE SHIELD

## 2018-07-02 ENCOUNTER — Emergency Department (HOSPITAL_COMMUNITY): Payer: Medicare Other

## 2018-07-02 ENCOUNTER — Other Ambulatory Visit (HOSPITAL_COMMUNITY): Payer: BLUE CROSS/BLUE SHIELD

## 2018-07-02 ENCOUNTER — Ambulatory Visit (HOSPITAL_COMMUNITY): Admission: RE | Admit: 2018-07-02 | Payer: Medicare Other | Source: Ambulatory Visit

## 2018-07-02 ENCOUNTER — Encounter (HOSPITAL_COMMUNITY): Payer: Self-pay | Admitting: Emergency Medicine

## 2018-07-02 ENCOUNTER — Other Ambulatory Visit (HOSPITAL_COMMUNITY): Payer: Medicare Other

## 2018-07-02 DIAGNOSIS — K760 Fatty (change of) liver, not elsewhere classified: Secondary | ICD-10-CM

## 2018-07-02 DIAGNOSIS — R7881 Bacteremia: Secondary | ICD-10-CM | POA: Diagnosis not present

## 2018-07-02 DIAGNOSIS — R630 Anorexia: Secondary | ICD-10-CM | POA: Diagnosis not present

## 2018-07-02 DIAGNOSIS — Z20828 Contact with and (suspected) exposure to other viral communicable diseases: Secondary | ICD-10-CM | POA: Diagnosis present

## 2018-07-02 DIAGNOSIS — E785 Hyperlipidemia, unspecified: Secondary | ICD-10-CM | POA: Diagnosis not present

## 2018-07-02 DIAGNOSIS — R918 Other nonspecific abnormal finding of lung field: Secondary | ICD-10-CM | POA: Diagnosis not present

## 2018-07-02 DIAGNOSIS — Z96642 Presence of left artificial hip joint: Secondary | ICD-10-CM | POA: Diagnosis present

## 2018-07-02 DIAGNOSIS — E1165 Type 2 diabetes mellitus with hyperglycemia: Secondary | ICD-10-CM | POA: Diagnosis not present

## 2018-07-02 DIAGNOSIS — I82401 Acute embolism and thrombosis of unspecified deep veins of right lower extremity: Secondary | ICD-10-CM | POA: Diagnosis present

## 2018-07-02 DIAGNOSIS — K900 Celiac disease: Secondary | ICD-10-CM | POA: Diagnosis not present

## 2018-07-02 DIAGNOSIS — D649 Anemia, unspecified: Secondary | ICD-10-CM | POA: Diagnosis present

## 2018-07-02 DIAGNOSIS — I739 Peripheral vascular disease, unspecified: Secondary | ICD-10-CM | POA: Diagnosis not present

## 2018-07-02 DIAGNOSIS — Z6835 Body mass index (BMI) 35.0-35.9, adult: Secondary | ICD-10-CM

## 2018-07-02 DIAGNOSIS — I10 Essential (primary) hypertension: Secondary | ICD-10-CM | POA: Diagnosis not present

## 2018-07-02 DIAGNOSIS — E11649 Type 2 diabetes mellitus with hypoglycemia without coma: Secondary | ICD-10-CM

## 2018-07-02 DIAGNOSIS — R251 Tremor, unspecified: Secondary | ICD-10-CM | POA: Diagnosis not present

## 2018-07-02 DIAGNOSIS — I82409 Acute embolism and thrombosis of unspecified deep veins of unspecified lower extremity: Secondary | ICD-10-CM

## 2018-07-02 DIAGNOSIS — E669 Obesity, unspecified: Secondary | ICD-10-CM | POA: Diagnosis present

## 2018-07-02 DIAGNOSIS — R531 Weakness: Secondary | ICD-10-CM | POA: Diagnosis not present

## 2018-07-02 DIAGNOSIS — R5383 Other fatigue: Secondary | ICD-10-CM

## 2018-07-02 DIAGNOSIS — R197 Diarrhea, unspecified: Secondary | ICD-10-CM | POA: Diagnosis present

## 2018-07-02 DIAGNOSIS — Z8249 Family history of ischemic heart disease and other diseases of the circulatory system: Secondary | ICD-10-CM

## 2018-07-02 DIAGNOSIS — K2970 Gastritis, unspecified, without bleeding: Secondary | ICD-10-CM | POA: Diagnosis present

## 2018-07-02 DIAGNOSIS — A419 Sepsis, unspecified organism: Secondary | ICD-10-CM

## 2018-07-02 DIAGNOSIS — Z91018 Allergy to other foods: Secondary | ICD-10-CM

## 2018-07-02 DIAGNOSIS — B954 Other streptococcus as the cause of diseases classified elsewhere: Secondary | ICD-10-CM | POA: Diagnosis present

## 2018-07-02 DIAGNOSIS — E1142 Type 2 diabetes mellitus with diabetic polyneuropathy: Secondary | ICD-10-CM | POA: Diagnosis present

## 2018-07-02 DIAGNOSIS — R509 Fever, unspecified: Secondary | ICD-10-CM | POA: Diagnosis present

## 2018-07-02 DIAGNOSIS — Z79899 Other long term (current) drug therapy: Secondary | ICD-10-CM

## 2018-07-02 DIAGNOSIS — K8681 Exocrine pancreatic insufficiency: Secondary | ICD-10-CM | POA: Diagnosis present

## 2018-07-02 DIAGNOSIS — Z7984 Long term (current) use of oral hypoglycemic drugs: Secondary | ICD-10-CM

## 2018-07-02 DIAGNOSIS — G4730 Sleep apnea, unspecified: Secondary | ICD-10-CM | POA: Diagnosis present

## 2018-07-02 DIAGNOSIS — K7460 Unspecified cirrhosis of liver: Secondary | ICD-10-CM | POA: Diagnosis not present

## 2018-07-02 DIAGNOSIS — IMO0002 Reserved for concepts with insufficient information to code with codable children: Secondary | ICD-10-CM | POA: Diagnosis present

## 2018-07-02 DIAGNOSIS — E1151 Type 2 diabetes mellitus with diabetic peripheral angiopathy without gangrene: Secondary | ICD-10-CM | POA: Diagnosis present

## 2018-07-02 DIAGNOSIS — K2980 Duodenitis without bleeding: Secondary | ICD-10-CM | POA: Diagnosis present

## 2018-07-02 DIAGNOSIS — K2990 Gastroduodenitis, unspecified, without bleeding: Secondary | ICD-10-CM | POA: Diagnosis present

## 2018-07-02 DIAGNOSIS — E876 Hypokalemia: Secondary | ICD-10-CM | POA: Diagnosis not present

## 2018-07-02 DIAGNOSIS — Z7901 Long term (current) use of anticoagulants: Secondary | ICD-10-CM

## 2018-07-02 LAB — COMPREHENSIVE METABOLIC PANEL
ALT: 59 U/L — ABNORMAL HIGH (ref 0–44)
AST: 40 U/L (ref 15–41)
Albumin: 4.2 g/dL (ref 3.5–5.0)
Alkaline Phosphatase: 63 U/L (ref 38–126)
Anion gap: 15 (ref 5–15)
BUN: 13 mg/dL (ref 8–23)
CO2: 21 mmol/L — ABNORMAL LOW (ref 22–32)
Calcium: 8.9 mg/dL (ref 8.9–10.3)
Chloride: 101 mmol/L (ref 98–111)
Creatinine, Ser: 0.88 mg/dL (ref 0.61–1.24)
GFR calc Af Amer: >60 mL/min (ref >60)
GFR calc non Af Amer: >60 mL/min (ref >60)
Glucose, Bld: 223 mg/dL — ABNORMAL HIGH (ref 70–99)
Potassium: 3.8 mmol/L (ref 3.5–5.1)
Sodium: 137 mmol/L (ref 135–145)
Total Bilirubin: 1.2 mg/dL (ref 0.3–1.2)
Total Protein: 7.7 g/dL (ref 6.5–8.1)

## 2018-07-02 LAB — PROCALCITONIN: Procalcitonin: <0.1 ng/mL

## 2018-07-02 LAB — BRAIN NATRIURETIC PEPTIDE: B Natriuretic Peptide: 138 pg/mL — ABNORMAL HIGH (ref 0.0–100.0)

## 2018-07-02 LAB — URINALYSIS, ROUTINE W REFLEX MICROSCOPIC
Bacteria, UA: NONE SEEN
Bilirubin Urine: NEGATIVE
Glucose, UA: NEGATIVE mg/dL
Ketones, ur: NEGATIVE mg/dL
Leukocytes,Ua: NEGATIVE
Nitrite: NEGATIVE
Protein, ur: 100 mg/dL — AB
Specific Gravity, Urine: 1.028 (ref 1.005–1.030)
pH: 5 (ref 5.0–8.0)

## 2018-07-02 LAB — PROTIME-INR
INR: 1.5 — ABNORMAL HIGH (ref 0.8–1.2)
Prothrombin Time: 17.9 seconds — ABNORMAL HIGH (ref 11.4–15.2)

## 2018-07-02 LAB — CBC WITH DIFFERENTIAL/PLATELET
Abs Immature Granulocytes: 0.03 10*3/uL (ref 0.00–0.07)
Basophils Absolute: 0 10*3/uL (ref 0.0–0.1)
Basophils Relative: 0 %
Eosinophils Absolute: 0 10*3/uL (ref 0.0–0.5)
Eosinophils Relative: 0 %
HCT: 42.6 % (ref 39.0–52.0)
Hemoglobin: 14.4 g/dL (ref 13.0–17.0)
Immature Granulocytes: 0 %
Lymphocytes Relative: 6 %
Lymphs Abs: 0.5 10*3/uL — ABNORMAL LOW (ref 0.7–4.0)
MCH: 29.9 pg (ref 26.0–34.0)
MCHC: 33.8 g/dL (ref 30.0–36.0)
MCV: 88.6 fL (ref 80.0–100.0)
Monocytes Absolute: 0.5 10*3/uL (ref 0.1–1.0)
Monocytes Relative: 7 %
Neutro Abs: 6.3 10*3/uL (ref 1.7–7.7)
Neutrophils Relative %: 87 %
Platelets: 93 10*3/uL — ABNORMAL LOW (ref 150–400)
RBC: 4.81 MIL/uL (ref 4.22–5.81)
RDW: 12.9 % (ref 11.5–15.5)
WBC: 7.3 10*3/uL (ref 4.0–10.5)
nRBC: 0 % (ref 0.0–0.2)

## 2018-07-02 LAB — SARS CORONAVIRUS 2 BY RT PCR (HOSPITAL ORDER, PERFORMED IN ~~LOC~~ HOSPITAL LAB): SARS Coronavirus 2: NEGATIVE

## 2018-07-02 LAB — LACTIC ACID, PLASMA
Lactic Acid, Venous: 4.3 mmol/L (ref 0.5–1.9)
Lactic Acid, Venous: 3.2 mmol/L (ref 0.5–1.9)

## 2018-07-02 LAB — FERRITIN: Ferritin: 76 ng/mL (ref 24–336)

## 2018-07-02 LAB — C-REACTIVE PROTEIN: CRP: 1.5 mg/dL — ABNORMAL HIGH (ref <1.0)

## 2018-07-02 LAB — CBG MONITORING, ED
Glucose-Capillary: 185 mg/dL — ABNORMAL HIGH (ref 70–99)
Glucose-Capillary: 220 mg/dL — ABNORMAL HIGH (ref 70–99)

## 2018-07-02 LAB — GLUCOSE, CAPILLARY: Glucose-Capillary: 244 mg/dL — ABNORMAL HIGH (ref 70–99)

## 2018-07-02 LAB — SEDIMENTATION RATE: Sed Rate: 13 mm/hr (ref 0–16)

## 2018-07-02 LAB — D-DIMER, QUANTITATIVE: D-Dimer, Quant: 0.28 ug/mL-FEU (ref 0.00–0.50)

## 2018-07-02 LAB — LACTATE DEHYDROGENASE: LDH: 143 U/L (ref 98–192)

## 2018-07-02 MED ORDER — ACETAMINOPHEN 650 MG RE SUPP: 650.0000 mg | Freq: Four times a day (QID) | RECTAL | Status: DC | PRN

## 2018-07-02 MED ORDER — BUDESONIDE 0.5 MG/2ML IN SUSP: 0.5000 mg | Freq: Two times a day (BID) | RESPIRATORY_TRACT | Status: DC

## 2018-07-02 MED ORDER — INSULIN ASPART 100 UNIT/ML ~~LOC~~ SOLN
0.0000 [IU] | Freq: Three times a day (TID) | SUBCUTANEOUS | Status: DC
  Administered 2018-07-02: 2 [IU] via SUBCUTANEOUS
  Administered 2018-07-03 – 2018-07-04 (×5): 3 [IU] via SUBCUTANEOUS
  Administered 2018-07-04 – 2018-07-05 (×2): 2 [IU] via SUBCUTANEOUS
  Administered 2018-07-05: 5 [IU] via SUBCUTANEOUS
  Administered 2018-07-05: 2 [IU] via SUBCUTANEOUS
  Administered 2018-07-06: 3 [IU] via SUBCUTANEOUS
  Administered 2018-07-06: 2 [IU] via SUBCUTANEOUS
  Administered 2018-07-07 (×2): 3 [IU] via SUBCUTANEOUS
  Filled 2018-07-02: qty 1

## 2018-07-02 MED ORDER — POLYETHYLENE GLYCOL 3350 17 G PO PACK: 17.0000 g | PACK | Freq: Every day | ORAL | Status: DC | PRN

## 2018-07-02 MED ORDER — SODIUM CHLORIDE 0.9 % IV BOLUS
1000.0000 mL | Freq: Once | INTRAVENOUS | Status: AC
  Administered 2018-07-02: 1000 mL via INTRAVENOUS

## 2018-07-02 MED ORDER — ENOXAPARIN SODIUM 40 MG/0.4ML ~~LOC~~ SOLN: 40.0000 mg | SUBCUTANEOUS | Status: DC

## 2018-07-02 MED ORDER — SODIUM CHLORIDE 0.9 % IV SOLN
INTRAVENOUS | Status: DC
  Administered 2018-07-02 – 2018-07-04 (×2): via INTRAVENOUS

## 2018-07-02 MED ORDER — VANCOMYCIN HCL IN DEXTROSE 1-5 GM/200ML-% IV SOLN
1000.0000 mg | INTRAVENOUS | Status: AC
  Administered 2018-07-02: 1000 mg via INTRAVENOUS
  Filled 2018-07-02 (×2): qty 200

## 2018-07-02 MED ORDER — ACETAMINOPHEN 325 MG PO TABS
650.0000 mg | ORAL_TABLET | Freq: Four times a day (QID) | ORAL | Status: DC | PRN
  Administered 2018-07-03 – 2018-07-04 (×5): 650 mg via ORAL
  Filled 2018-07-02 (×5): qty 2

## 2018-07-02 MED ORDER — HYDROCODONE-ACETAMINOPHEN 5-325 MG PO TABS
1.0000 | ORAL_TABLET | ORAL | Status: DC | PRN
  Administered 2018-07-04: 1 via ORAL
  Filled 2018-07-02: qty 1

## 2018-07-02 MED ORDER — VANCOMYCIN HCL 10 G IV SOLR: 2000.0000 mg | Freq: Once | INTRAVENOUS | Status: DC

## 2018-07-02 MED ORDER — IPRATROPIUM-ALBUTEROL 0.5-2.5 (3) MG/3ML IN SOLN: 3.0000 mL | RESPIRATORY_TRACT | Status: DC | PRN

## 2018-07-02 MED ORDER — SODIUM CHLORIDE 0.9% FLUSH
3.0000 mL | Freq: Once | INTRAVENOUS | Status: AC
  Administered 2018-07-02: 3 mL via INTRAVENOUS

## 2018-07-02 MED ORDER — SODIUM CHLORIDE 0.9 % IV SOLN
1.0000 g | Freq: Once | INTRAVENOUS | Status: AC
  Administered 2018-07-02: 1 g via INTRAVENOUS
  Filled 2018-07-02: qty 1

## 2018-07-02 MED ORDER — SODIUM CHLORIDE 0.9 % IV SOLN
500.0000 mg | INTRAVENOUS | Status: DC
  Administered 2018-07-02 – 2018-07-07 (×6): 500 mg via INTRAVENOUS
  Filled 2018-07-02 (×6): qty 500

## 2018-07-02 MED ORDER — INSULIN ASPART 100 UNIT/ML ~~LOC~~ SOLN
0.0000 [IU] | Freq: Every day | SUBCUTANEOUS | Status: DC
  Administered 2018-07-02: 2 [IU] via SUBCUTANEOUS

## 2018-07-02 MED ORDER — FLEET ENEMA 7-19 GM/118ML RE ENEM: 1.0000 | ENEMA | Freq: Once | RECTAL | Status: DC | PRN

## 2018-07-02 MED ORDER — ENOXAPARIN SODIUM 60 MG/0.6ML ~~LOC~~ SOLN
60.0000 mg | SUBCUTANEOUS | Status: DC
  Administered 2018-07-02 – 2018-07-05 (×4): 60 mg via SUBCUTANEOUS
  Filled 2018-07-02 (×4): qty 0.6

## 2018-07-02 MED ORDER — ACETAMINOPHEN 500 MG PO TABS
1000.0000 mg | ORAL_TABLET | Freq: Once | ORAL | Status: AC
  Administered 2018-07-02: 1000 mg via ORAL
  Filled 2018-07-02: qty 2

## 2018-07-02 MED ORDER — SODIUM CHLORIDE 0.9 % IV SOLN
1.0000 g | INTRAVENOUS | Status: DC
  Administered 2018-07-02 – 2018-07-04 (×3): 1 g via INTRAVENOUS
  Filled 2018-07-02 (×3): qty 10

## 2018-07-02 MED ORDER — IPRATROPIUM-ALBUTEROL 0.5-2.5 (3) MG/3ML IN SOLN: 3.0000 mL | Freq: Four times a day (QID) | RESPIRATORY_TRACT | Status: DC

## 2018-07-02 MED ORDER — BISACODYL 5 MG PO TBEC: 5.0000 mg | DELAYED_RELEASE_TABLET | Freq: Every day | ORAL | Status: DC | PRN

## 2018-07-02 MED ORDER — SENNOSIDES-DOCUSATE SODIUM 8.6-50 MG PO TABS
1.0000 | ORAL_TABLET | Freq: Every evening | ORAL | Status: DC | PRN
  Filled 2018-07-02: qty 1

## 2018-07-02 NOTE — ED Triage Notes (Signed)
Patient reports headache and chills that started today around 1100. Nausea and dry heaves but no emesis. CBG at home 182. Patient reports elevated BP.

## 2018-07-02 NOTE — H&P (Signed)
History and Physical    Joel Reed. WUJ:[id number] DOB: [birth year] DOA: 07/02/2018  PCP: Donita Brooks, MD Patient coming from: Home  Chief Complaint: Fever  HPI: Joel Reed. is a 68 y.o. male with medical history significant of nonalcoholic liver cirrhosis, celiac disease, diabetes mellitus type 2, hypertension came to the hospital with complaints of fever and fatigue.  Patient states he was feeling well up until yesterday evening but this morning when he woke up he started having subjective fevers and chills with feeling of nausea.  Denies any abdominal pain, shortness of breath, cough or any other symptoms.  Denies any sick contacts. In the ER he was noted to have temperature of 101.1 which went up to 102.3.  COVID-negative.  Saturating greater than 90% on room air.  Chest x-ray showed questionable infiltrate at the bases.  Normal WBC.  While in the ER he had one episode of diarrhea but no more since.  It was nonbloody.  He was started on IV Rocephin and azithromycin admitted to the hospital.   Review of Systems: As per HPI otherwise 10 point review of systems negative.  Review of Systems Otherwise negative except as per HPI, including: General: Denies  chills, night sweats or unintended weight loss. Resp: Denies cough, wheezing, shortness of breath. Cardiac: Denies chest pain, palpitations, orthopnea, paroxysmal nocturnal dyspnea. GI: Denies abdominal pain, nausea, vomiting,  constipation GU: Denies dysuria, frequency, hesitancy or incontinence MS: Denies muscle aches, joint pain or swelling Neuro: Denies headache, neurologic deficits (focal weakness, numbness, tingling), abnormal gait Psych: Denies anxiety, depression, SI/HI/AVH Skin: Denies new rashes or lesions ID: Denies sick contacts, exotic exposures, travel  Past Medical History:  Diagnosis Date  . Allergy   . Celiac disease   . Cirrhosis ([facility])   . Diabetes mellitus without complication ([facility])   . Gastritis  and duodenitis    on egd 12/2017  . Hyperlipidemia    Patient denies  . Hypertension   . IDA (iron deficiency anemia)   . Internal hemorrhoids   . Nephrolithiasis   . 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  . Sleep apnea   . Tubular adenoma of colon     Past Surgical History:  Procedure Laterality Date  . COLONOSCOPY    . JOINT REPLACEMENT     left hip Dr Thurston Hole  . SPINE SURGERY     disectomy    SOCIAL HISTORY:  reports that he has never smoked. He has never used smokeless tobacco. He reports that he does not drink alcohol or use drugs.  Allergies  Allergen Reactions  . Gluten Meal     FAMILY HISTORY: Family History  Problem Relation Age of Onset  . Esophageal cancer Father   . Breast cancer Sister        bone cancer  . Heart attack Brother 40  . Cancer - Other Sister        cervical?  . Colon cancer Neg Hx   . Rectal cancer Neg Hx   . Stomach cancer Neg Hx      Prior to Admission medications   Medication Sig Start Date End Date Taking? Authorizing Provider  amLODipine (NORVASC) 5 MG tablet TAKE 1 TABLET BY MOUTH EVERY DAY Patient taking differently: Take 5 mg by mouth every morning.  05/05/18  Yes Donita Brooks, MD  cholecalciferol (VITAMIN D3) 25 MCG (1000 UT) tablet Take 1,000 Units by mouth daily.   Yes [provider]  fluticasone (FLONASE) 50 MCG/ACT nasal spray Place 2 sprays into both nostrils daily. Patient taking differently: Place 2 sprays into both nostrils daily as needed for allergies or rhinitis.  03/12/18  Yes Danelle Berry, PA-C  glimepiride (AMARYL) 4 MG tablet Take 0.5 tablets (2 mg total) by mouth daily. 03/20/18  Yes Donita Brooks, MD  losartan (COZAAR) 100 MG tablet Take 1 tablet by mouth once daily Patient taking differently: Take 100 mg by mouth every morning.  06/30/18  Yes Donita Brooks, MD  metFORMIN (GLUCOPHAGE) 500 MG tablet TAKE 1 TABLET BY MOUTH 2 TIMES DAILY WITH A MEAL. Patient  taking differently: Take 500 mg by mouth 2 (two) times daily with a meal.  05/05/18  Yes Donita Brooks, MD  metoprolol succinate (TOPROL-XL) 25 MG 24 hr tablet Take 1 tablet by mouth once daily Patient taking differently: Take 25 mg by mouth daily.  06/30/18  Yes Donita Brooks, MD  pantoprazole (PROTONIX) 40 MG tablet Take 1 tablet (40 mg total) by mouth daily. 03/12/18  Yes Danelle Berry, PA-C  vitamin B-12 (CYANOCOBALAMIN) 500 MCG tablet Take 500 mcg by mouth daily.   Yes [provider]  XARELTO 20 MG TABS tablet TAKE 1 TABLET BY MOUTH DAILY WITH SUPPER. Patient taking differently: Take 20 mg by mouth daily with supper.  01/30/18  Yes Donita Brooks, MD    Physical Exam: Vitals:   07/02/18 1630 07/02/18 1645 07/02/18 1700 07/02/18 1807  BP: (!) 156/80  (!) 171/84 118/62  Pulse: (!) 105 (!) 104 (!) 106 (!) 107  Resp: (!) 24 (!) 31 (!) 31 20  Temp:    (!) 102.9 F (39.4 C)  TempSrc:    Oral  SpO2: 95% 96% 95% 92%  Weight:      Height:          Constitutional: NAD, calm, comfortable Eyes: PERRL, lids and conjunctivae normal ENMT: Mucous membranes are moist. Posterior pharynx clear of any exudate or lesions.Normal dentition.  Neck: normal, supple, no masses, no thyromegaly Respiratory: clear to auscultation bilaterally, no wheezing, no crackles. Normal respiratory effort. No accessory muscle use.  Cardiovascular: Regular rate and rhythm, no murmurs / rubs / gallops. No extremity edema. 2+ pedal pulses. No carotid bruits.  Abdomen: no tenderness, no masses palpated. No hepatosplenomegaly. Bowel sounds positive.  Musculoskeletal: no clubbing / cyanosis. No joint deformity upper and lower extremities. Good ROM, no contractures. Normal muscle tone.  Skin: no rashes, lesions, ulcers. No induration Neurologic: CN 2-12 grossly intact. Sensation intact, DTR normal. Strength 5/5 in all 4.  Psychiatric: Normal judgment and insight. Alert and oriented x 3. Normal mood.      Labs on Admission: I have personally reviewed following labs and imaging studies  CBC: Recent Labs  Lab 07/02/18 1356  WBC 7.3  NEUTROABS 6.3  HGB 14.4  HCT 42.6  MCV 88.6  PLT 93*   Basic Metabolic Panel: Recent Labs  Lab 07/02/18 1356  NA 137  K 3.8  CL 101  CO2 21*  GLUCOSE 223*  BUN 13  CREATININE 0.88  CALCIUM 8.9   GFR: Estimated Creatinine Clearance: 110.9 mL/min (by C-G formula based on SCr of 0.88 mg/dL). Liver Function Tests: Recent Labs  Lab 07/02/18 1356  AST 40  ALT 59*  ALKPHOS 63  BILITOT 1.2  PROT 7.7  ALBUMIN 4.2   No results for input(s): LIPASE, AMYLASE in the last 168 hours. No results for input(s): AMMONIA in the last  168 hours. Coagulation Profile: Recent Labs  Lab 07/02/18 1356  INR 1.5*   Cardiac Enzymes: No results for input(s): CKTOTAL, CKMB, CKMBINDEX, TROPONINI in the last 168 hours. BNP (last 3 results) No results for input(s): PROBNP in the last 8760 hours. HbA1C: No results for input(s): HGBA1C in the last 72 hours. CBG: Recent Labs  Lab 07/02/18 1346 07/02/18 1701  GLUCAP 185* 220*   Lipid Profile: No results for input(s): CHOL, HDL, LDLCALC, TRIG, CHOLHDL, LDLDIRECT in the last 72 hours. Thyroid Function Tests: No results for input(s): TSH, T4TOTAL, FREET4, T3FREE, THYROIDAB in the last 72 hours. Anemia Panel: Recent Labs    07/02/18 1356  FERRITIN 76   Urine analysis:    Component Value Date/Time   COLORURINE AMBER (A) 07/02/2018 1356   APPEARANCEUR HAZY (A) 07/02/2018 1356   LABSPEC 1.028 07/02/2018 1356   PHURINE 5.0 07/02/2018 1356   GLUCOSEU NEGATIVE 07/02/2018 1356   HGBUR SMALL (A) 07/02/2018 1356   BILIRUBINUR NEGATIVE 07/02/2018 1356   KETONESUR NEGATIVE 07/02/2018 1356   PROTEINUR 100 (A) 07/02/2018 1356   NITRITE NEGATIVE 07/02/2018 1356   LEUKOCYTESUR NEGATIVE 07/02/2018 1356   Sepsis Labs: !!!!!!!!!!!!!!!!!!!!!!!!!!!!!!!!!!!!!!!!!!!! @LABRCNTIP (procalcitonin:4,lacticidven:4) ) Recent  Results (from the past 240 hour(s))  Culture, blood (Routine x 2)     Status: None (Preliminary result)   Collection Time: 07/02/18  1:56 PM  Result Value Ref Range Status   Specimen Description BLOOD RIGHT ARM  Final   Special Requests   Final    BOTTLES DRAWN AEROBIC AND ANAEROBIC Blood Culture adequate volume Performed at [facility], [address], [place], [place] [address]    Culture PENDING  Incomplete   Report Status PENDING  Incomplete  Culture, blood (Routine x 2)     Status: None (Preliminary result)   Collection Time: 07/02/18  2:01 PM  Result Value Ref Range Status   Specimen Description BLOOD LEFT ARM  Final   Special Requests   Final    BOTTLES DRAWN AEROBIC AND ANAEROBIC Blood Culture adequate volume Performed at [facility], [address], [place], [place] [address]    Culture PENDING  Incomplete   Report Status PENDING  Incomplete  SARS Coronavirus 2 (CEPHEID- Performed in [facility] hospital lab), Hosp Order     Status: None   Collection Time: 07/02/18  2:20 PM  Result Value Ref Range Status   SARS Coronavirus 2 NEGATIVE NEGATIVE Final    Comment: (NOTE) If result is NEGATIVE SARS-CoV-2 target nucleic acids are NOT DETECTED. The SARS-CoV-2 RNA is generally detectable in upper and lower  respiratory specimens during the acute phase of infection. The lowest  concentration of SARS-CoV-2 viral copies this assay can detect is 250  copies / mL. A negative result does not preclude SARS-CoV-2 infection  and should not be used as the sole basis for treatment or other  patient management decisions.  A negative result may occur with  improper specimen collection / handling, submission of specimen other  than nasopharyngeal swab, presence of viral mutation(s) within the  areas targeted by this assay, and inadequate number of viral copies  (<250 copies / mL). A negative result must be combined with clinical  observations, patient history, and epidemiological  information. If result is POSITIVE SARS-CoV-2 target nucleic acids are DETECTED. The SARS-CoV-2 RNA is generally detectable in upper and lower  respiratory specimens dur ing the acute phase of infection.  Positive  results are indicative of active infection with SARS-CoV-2.  Clinical  correlation with patient  history and other diagnostic information is  necessary to determine patient infection status.  Positive results do  not rule out bacterial infection or co-infection with other viruses. If result is PRESUMPTIVE POSTIVE SARS-CoV-2 nucleic acids MAY BE PRESENT.   A presumptive positive result was obtained on the submitted specimen  and confirmed on repeat testing.  While 2019 novel coronavirus  (SARS-CoV-2) nucleic acids may be present in the submitted sample  additional confirmatory testing may be necessary for epidemiological  and / or clinical management purposes  to differentiate between  SARS-CoV-2 and other Sarbecovirus currently known to infect humans.  If clinically indicated additional testing with an alternate test  methodology [phone]) is advised. The SARS-CoV-2 RNA is generally  detectable in upper and lower respiratory sp ecimens during the acute  phase of infection. The expected result is Negative. Fact Sheet for Patients:  BoilerBrush.com.cy Fact Sheet for Healthcare Providers: https://pope.com/ This test is not yet approved or cleared by the [place] FDA and has been authorized for detection and/or diagnosis of SARS-CoV-2 by FDA under an Emergency Use Authorization (EUA).  This EUA will remain in effect (meaning this test can be used) for the duration of the COVID-19 declaration under Section 564(b)(1) of the Act, 21 U.S.C. section 360bbb-3(b)(1), unless the authorization is terminated or revoked sooner. Performed at [facility], [address], [place], [place] [address]      Radiological Exams on Admission:  Dg Chest Port 1 View  Result Date: 07/02/2018 CLINICAL DATA:  Sepsis EXAM: PORTABLE CHEST 1 VIEW COMPARISON:  08/29/2016 FINDINGS: Evaluation is limited by technique and body habitus. There is diffuse increased attenuation of the lung bases bilaterally. There is no pneumothorax. No large pleural effusion. The cardiac silhouette is within normal limits. IMPRESSION: 1. Evaluation limited by technique. 2. Diffuse increased attenuation of the lung bases bilaterally may be secondary to soft tissue attenuation artifact. An underlying infiltrate is difficult to exclude. A follow-up two-view chest x-ray is recommended for further evaluation. Electronically Signed   By: Katherine Mantle M.D.   On: 07/02/2018 15:17     All images have been reviewed by me personally.   Assessment/Plan Principal Problem:   Fever Active Problems:   Type II diabetes mellitus, uncontrolled (HCC)   HTN (hypertension), benign   HLD (hyperlipidemia)   PVD (peripheral vascular disease) ([facility])   Gastritis and duodenitis   Celiac disease   Fatigue   NAFLD (nonalcoholic fatty liver disease)   Cirrhosis, non-alcoholic (HCC)   Fever, unknown exact etiology- suspect Viral? -Admit for observation, No WBC, UA - neg, COVID= negative. CXR= Diffuse increased attenuation b/l. ProCal= negative.  Trend lactate -Azithromycin IV, Rocephin started in the ED. Should be able to discontinue in next 24 hrs. -Duonebs q4hrs prn. Ferritin- normal. CRP 1.5. LDH- Normal; D Dimer- negative.  -Check Resp Panel  Nonbloody diarrhea, one episode - Abdominal exam is nontender and benign.  If has any further more episode, will investigate further with stool studies and possibly get CT of the abdomen pelvis  Diabetes mellitus type 2 with peripheral neuropathy -Hold home medication, insulin sliding scale Accu-Chek  Essential hypertension -Continue home medication once med rec review has been performed, appears patient is on Norvasc, losartan and  metoprolol.  History of nonalcoholic fatty liver disease with cirrhosis -Follows outpatient gastroenterology.  Endoscopy in 2019 showed gastritis and duodenitis.   DVT prophylaxis: Lovenox Code Status: Full code Family Communication: None at bedside Disposition Plan: To be determined Consults called: None Admission status: Observation  admission   Time Spent: 65 minutes.  >50% of the time was devoted to discussing the patients care, assessment, plan and disposition with other care givers along with counseling the patient about the risks and benefits of treatment.    Dickson Kostelnik Joline Maxcy MD [facility]  If 7PM-7AM, please contact night-coverage [email or web address]  07/02/2018, 7:01 PM

## 2018-07-02 NOTE — ED Provider Notes (Signed)
[name] EMERGENCY DEPARTMENT Provider Note   CSN: [id number] Arrival date & time: 07/02/18  1326    History   Chief Complaint Chief Complaint  Patient presents with  . Tremors    HPI Joel Reed. is a 68 y.o. male.  HPI   68 year old male fever and generalized weakness.  Patient states that he woke up this morning he did not feel well.  Describes fatigue, nausea, loss of appetite.  He has had chills.  Shaking at times.  Denies any acute pain.  No cough.  No urinary complaints.  No vomiting or diarrhea.  No sick contacts that he is aware of.  Past Medical History:  Diagnosis Date  . Allergy   . Celiac disease   . Cirrhosis ([place])   . Diabetes mellitus without complication ([place])   . Gastritis and duodenitis    on egd 12/2017  . Hyperlipidemia    Patient denies  . Hypertension   . IDA (iron deficiency anemia)   . Internal hemorrhoids   . Nephrolithiasis   . 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  . Sleep apnea   . Tubular adenoma of colon     Patient Active Problem List   Diagnosis Date Noted  . Celiac disease   . Gastritis and duodenitis   . PVD (peripheral vascular disease) ([place])   . Type II diabetes mellitus, uncontrolled ([place]) 11/08/2016  . HTN (hypertension), benign 11/08/2016  . HLD (hyperlipidemia) 11/08/2016  . History of nephrolithiasis 11/08/2016    Past Surgical History:  Procedure Laterality Date  . COLONOSCOPY    . JOINT REPLACEMENT     left hip Dr Noemi Chapel  . SPINE SURGERY     disectomy       Home Medications    Prior to Admission medications   Medication Sig Start Date End Date Taking? Authorizing Provider  amLODipine (NORVASC) 5 MG tablet TAKE 1 TABLET BY MOUTH EVERY DAY 05/05/18   Susy Frizzle, MD  Blood Glucose Monitoring Suppl (CONTOUR NEXT MONITOR) w/Device KIT 1 strip by Does not apply route daily. 08/19/17   Susy Frizzle, MD  Cetirizine HCl (ZYRTEC ALLERGY) 10 MG TBDP Take 10 mg by  mouth at bedtime. 03/12/18   Delsa Grana, PA-C  ferrous sulfate 325 (65 FE) MG EC tablet Take 325 mg by mouth daily.    [provider]  fluticasone (FLONASE) 50 MCG/ACT nasal spray Place 2 sprays into both nostrils daily. 03/12/18   Delsa Grana, PA-C  glimepiride (AMARYL) 2 MG tablet Take 2 mg by mouth daily with breakfast.    [provider]  glimepiride (AMARYL) 4 MG tablet Take 0.5 tablets (2 mg total) by mouth daily. 03/20/18   Susy Frizzle, MD  glucose blood (CONTOUR NEXT TEST) test strip Check bs daily DX: E11.9 10/01/17   Susy Frizzle, MD  Lancets Misc. MISC Use with daily bs check 10/01/17   Susy Frizzle, MD  losartan (COZAAR) 100 MG tablet Take 1 tablet by mouth once daily 06/30/18   Susy Frizzle, MD  metFORMIN (GLUCOPHAGE) 500 MG tablet TAKE 1 TABLET BY MOUTH 2 TIMES DAILY WITH A MEAL. 05/05/18   Susy Frizzle, MD  metoprolol succinate (TOPROL-XL) 25 MG 24 hr tablet Take 1 tablet by mouth once daily 06/30/18   Susy Frizzle, MD  OVER THE COUNTER MEDICATION B 12 , one tablet daily.    [provider]  OVER THE  COUNTER MEDICATION Vitamin D 3, 1200 mg. One time daily.    [provider]  pantoprazole (PROTONIX) 40 MG tablet Take 1 tablet (40 mg total) by mouth daily. 03/12/18   Delsa Grana, PA-C  XARELTO 20 MG TABS tablet TAKE 1 TABLET BY MOUTH DAILY WITH SUPPER. 01/30/18   Susy Frizzle, MD    Family History Family History  Problem Relation Age of Onset  . Esophageal cancer Father   . Breast cancer Sister        bone cancer  . Heart attack Brother 43  . Cancer - Other Sister        cervical?  . Colon cancer Neg Hx   . Rectal cancer Neg Hx   . Stomach cancer Neg Hx     Social History Social History   Tobacco Use  . Smoking status: Never Smoker  . Smokeless tobacco: Never Used  Substance Use Topics  . Alcohol use: No  . Drug use: No     Allergies   Gluten meal   Review of Systems Review of Systems   All systems reviewed and negative, other than as noted in HPI. Physical Exam Updated Vital Signs BP (!) 164/74 (BP Location: Left Arm)   Pulse (!) 114   Temp (!) 101.1 F (38.4 C) (Oral)   Resp 18   Ht 6' (1.829 m)   Wt 117.9 kg   SpO2 96%   BMI 35.26 kg/m   Physical Exam Vitals signs and nursing note reviewed.  Constitutional:      General: He is not in acute distress.    Appearance: He is well-developed.  HENT:     Head: Normocephalic and atraumatic.  Eyes:     General:        Right eye: No discharge.        Left eye: No discharge.     Conjunctiva/sclera: Conjunctivae normal.  Neck:     Musculoskeletal: Neck supple.  Cardiovascular:     Rate and Rhythm: Regular rhythm. Tachycardia present.     Heart sounds: Normal heart sounds. No murmur. No friction rub. No gallop.   Pulmonary:     Effort: Pulmonary effort is normal. No respiratory distress.     Breath sounds: Normal breath sounds.  Abdominal:     General: There is no distension.     Palpations: Abdomen is soft.     Tenderness: There is no abdominal tenderness.  Musculoskeletal:        General: No tenderness.  Skin:    General: Skin is warm and dry.  Neurological:     Mental Status: He is alert.     Comments: Somewhat drowsy and slow to respond to questions but answering all appropriately.  Following commands.  No focal motor deficits.      ED Treatments / Results  Labs (all labs ordered are listed, but only abnormal results are displayed) Labs Reviewed  COMPREHENSIVE METABOLIC PANEL - Abnormal; Notable for the following components:      Result Value   CO2 21 (*)    Glucose, Bld 223 (*)    ALT 59 (*)    All other components within normal limits  LACTIC ACID, PLASMA - Abnormal; Notable for the following components:   Lactic Acid, Venous 4.3 (*)    All other components within normal limits  CBC WITH DIFFERENTIAL/PLATELET - Abnormal; Notable for the following components:   Platelets 93 (*)    Lymphs  Abs 0.5 (*)    All  other components within normal limits  PROTIME-INR - Abnormal; Notable for the following components:   Prothrombin Time 17.9 (*)    INR 1.5 (*)    All other components within normal limits  URINALYSIS, ROUTINE W REFLEX MICROSCOPIC - Abnormal; Notable for the following components:   Color, Urine AMBER (*)    APPearance HAZY (*)    Hgb urine dipstick SMALL (*)    Protein, ur 100 (*)    All other components within normal limits  CBG MONITORING, ED - Abnormal; Notable for the following components:   Glucose-Capillary 185 (*)    All other components within normal limits  CULTURE, BLOOD (ROUTINE X 2)  CULTURE, BLOOD (ROUTINE X 2)  SARS CORONAVIRUS 2 (HOSPITAL ORDER, [place] [facility])  LACTIC ACID, PLASMA    EKG EKG Interpretation  Date/Time:  Wednesday Jul 02 2018 14:08:47 EDT Ventricular Rate:  108 PR Interval:    QRS Duration: 105 QT Interval:  387 QTC Calculation: 519 R Axis:   -21 Text Interpretation:  Sinus tachycardia Abnormal R-wave progression, early transition Probable left ventricular hypertrophy Prolonged QT interval Confirmed by Virgel Manifold [phone]) on 07/02/2018 2:45:54 PM   Radiology Dg Chest Port 1 View  Result Date: 07/02/2018 CLINICAL DATA:  Sepsis EXAM: PORTABLE CHEST 1 VIEW COMPARISON:  08/29/2016 FINDINGS: Evaluation is limited by technique and body habitus. There is diffuse increased attenuation of the lung bases bilaterally. There is no pneumothorax. No large pleural effusion. The cardiac silhouette is within normal limits. IMPRESSION: 1. Evaluation limited by technique. 2. Diffuse increased attenuation of the lung bases bilaterally may be secondary to soft tissue attenuation artifact. An underlying infiltrate is difficult to exclude. A follow-up two-view chest x-ray is recommended for further evaluation. Electronically Signed   By: Constance Holster M.D.   On: 07/02/2018 15:17    Procedures Procedures (including  critical care time)  Medications Ordered in ED Medications  vancomycin (VANCOCIN) IVPB 1000 mg/200 mL premix (has no administration in time range)  sodium chloride 0.9 % bolus 1,000 mL (has no administration in time range)  sodium chloride flush (NS) 0.9 % injection 3 mL (3 mLs Intravenous Given 07/02/18 1429)  ceFEPIme (MAXIPIME) 1 g in sodium chloride 0.9 % 100 mL IVPB (1 g Intravenous New Bag/Given 07/02/18 1425)  sodium chloride 0.9 % bolus 1,000 mL (1,000 mLs Intravenous New Bag/Given 07/02/18 1429)  acetaminophen (TYLENOL) tablet 1,000 mg (1,000 mg Oral Given 07/02/18 1417)     Initial Impression / Assessment and Plan / ED Course  I have reviewed the triage vital signs and the nursing notes.  Pertinent labs & imaging results that were available during my care of the patient were reviewed by me and considered in my medical decision making (see chart for details).  68 year old male with fever.  X-ray equivocal per radiology but clinically suspect he may have pneumonia.  Mild hypoxemia noted on room air down into the low 90s.  Denies any shortness of breath though.  COVID is negative.  He received empiric cefepime and Vanco.  Lactic acid is elevated.  IV fluids.  He is little bit more alert after initial treatments.  Will admit for ongoing work-up/observation.  [name] was evaluated in Emergency Department on 07/02/2018 for the symptoms described in the history of present illness. He was evaluated in the context of the global COVID-19 pandemic, which necessitated consideration that the patient might be at risk for infection with the SARS-CoV-2 virus that causes COVID-19. Institutional  protocols and algorithms that pertain to the evaluation of patients at risk for COVID-19 are in a state of rapid change based on information released by regulatory bodies including the CDC and federal and state organizations. These policies and algorithms were followed during the patient's care in the ED.   Final Clinical Impressions(s) / ED Diagnoses   Final diagnoses:  Sepsis [facility])    ED Discharge Orders    None       Virgel Manifold, MD 07/02/18 1623

## 2018-07-02 NOTE — ED Notes (Signed)
Update given to family

## 2018-07-03 ENCOUNTER — Observation Stay (HOSPITAL_COMMUNITY): Payer: Medicare Other

## 2018-07-03 DIAGNOSIS — B954 Other streptococcus as the cause of diseases classified elsewhere: Secondary | ICD-10-CM | POA: Diagnosis present

## 2018-07-03 DIAGNOSIS — K7460 Unspecified cirrhosis of liver: Secondary | ICD-10-CM | POA: Diagnosis present

## 2018-07-03 DIAGNOSIS — Z20828 Contact with and (suspected) exposure to other viral communicable diseases: Secondary | ICD-10-CM | POA: Diagnosis present

## 2018-07-03 DIAGNOSIS — K8681 Exocrine pancreatic insufficiency: Secondary | ICD-10-CM | POA: Diagnosis present

## 2018-07-03 DIAGNOSIS — E1142 Type 2 diabetes mellitus with diabetic polyneuropathy: Secondary | ICD-10-CM | POA: Diagnosis present

## 2018-07-03 DIAGNOSIS — R7881 Bacteremia: Secondary | ICD-10-CM

## 2018-07-03 DIAGNOSIS — R630 Anorexia: Secondary | ICD-10-CM | POA: Diagnosis present

## 2018-07-03 DIAGNOSIS — K2980 Duodenitis without bleeding: Secondary | ICD-10-CM | POA: Diagnosis present

## 2018-07-03 DIAGNOSIS — I10 Essential (primary) hypertension: Secondary | ICD-10-CM | POA: Diagnosis present

## 2018-07-03 DIAGNOSIS — D649 Anemia, unspecified: Secondary | ICD-10-CM | POA: Diagnosis present

## 2018-07-03 DIAGNOSIS — G4730 Sleep apnea, unspecified: Secondary | ICD-10-CM | POA: Diagnosis present

## 2018-07-03 DIAGNOSIS — Z96642 Presence of left artificial hip joint: Secondary | ICD-10-CM | POA: Diagnosis present

## 2018-07-03 DIAGNOSIS — K2970 Gastritis, unspecified, without bleeding: Secondary | ICD-10-CM | POA: Diagnosis present

## 2018-07-03 DIAGNOSIS — R197 Diarrhea, unspecified: Secondary | ICD-10-CM | POA: Diagnosis present

## 2018-07-03 DIAGNOSIS — I82401 Acute embolism and thrombosis of unspecified deep veins of right lower extremity: Secondary | ICD-10-CM | POA: Diagnosis present

## 2018-07-03 DIAGNOSIS — M79661 Pain in right lower leg: Secondary | ICD-10-CM | POA: Diagnosis not present

## 2018-07-03 DIAGNOSIS — R251 Tremor, unspecified: Secondary | ICD-10-CM | POA: Diagnosis present

## 2018-07-03 DIAGNOSIS — M7989 Other specified soft tissue disorders: Secondary | ICD-10-CM | POA: Diagnosis not present

## 2018-07-03 DIAGNOSIS — R509 Fever, unspecified: Secondary | ICD-10-CM | POA: Diagnosis not present

## 2018-07-03 DIAGNOSIS — K900 Celiac disease: Secondary | ICD-10-CM | POA: Diagnosis present

## 2018-07-03 DIAGNOSIS — E1151 Type 2 diabetes mellitus with diabetic peripheral angiopathy without gangrene: Secondary | ICD-10-CM | POA: Diagnosis present

## 2018-07-03 DIAGNOSIS — E876 Hypokalemia: Secondary | ICD-10-CM | POA: Diagnosis not present

## 2018-07-03 DIAGNOSIS — E669 Obesity, unspecified: Secondary | ICD-10-CM | POA: Diagnosis present

## 2018-07-03 DIAGNOSIS — K760 Fatty (change of) liver, not elsewhere classified: Secondary | ICD-10-CM | POA: Diagnosis present

## 2018-07-03 DIAGNOSIS — E1165 Type 2 diabetes mellitus with hyperglycemia: Secondary | ICD-10-CM | POA: Diagnosis present

## 2018-07-03 DIAGNOSIS — E785 Hyperlipidemia, unspecified: Secondary | ICD-10-CM | POA: Diagnosis present

## 2018-07-03 LAB — BASIC METABOLIC PANEL
Anion gap: 9 (ref 5–15)
BUN: 14 mg/dL (ref 8–23)
CO2: 23 mmol/L (ref 22–32)
Calcium: 7.9 mg/dL — ABNORMAL LOW (ref 8.9–10.3)
Chloride: 104 mmol/L (ref 98–111)
Creatinine, Ser: 0.75 mg/dL (ref 0.61–1.24)
GFR calc Af Amer: >60 mL/min (ref >60)
GFR calc non Af Amer: >60 mL/min (ref >60)
Glucose, Bld: 191 mg/dL — ABNORMAL HIGH (ref 70–99)
Potassium: 3.2 mmol/L — ABNORMAL LOW (ref 3.5–5.1)
Sodium: 136 mmol/L (ref 135–145)

## 2018-07-03 LAB — RESPIRATORY PANEL BY PCR

## 2018-07-03 LAB — GLUCOSE, CAPILLARY
Glucose-Capillary: 166 mg/dL — ABNORMAL HIGH (ref 70–99)
Glucose-Capillary: 163 mg/dL — ABNORMAL HIGH (ref 70–99)
Glucose-Capillary: 156 mg/dL — ABNORMAL HIGH (ref 70–99)
Glucose-Capillary: 148 mg/dL — ABNORMAL HIGH (ref 70–99)

## 2018-07-03 LAB — HIV ANTIBODY (ROUTINE TESTING W REFLEX): HIV Screen 4th Generation wRfx: NONREACTIVE

## 2018-07-03 LAB — CBC
HCT: 36.9 % — ABNORMAL LOW (ref 39.0–52.0)
Hemoglobin: 12.4 g/dL — ABNORMAL LOW (ref 13.0–17.0)
MCH: 30.2 pg (ref 26.0–34.0)
MCHC: 33.6 g/dL (ref 30.0–36.0)
MCV: 89.8 fL (ref 80.0–100.0)
Platelets: 70 10*3/uL — ABNORMAL LOW (ref 150–400)
RBC: 4.11 MIL/uL — ABNORMAL LOW (ref 4.22–5.81)
RDW: 13.2 % (ref 11.5–15.5)
WBC: 6.2 10*3/uL (ref 4.0–10.5)
nRBC: 0 % (ref 0.0–0.2)

## 2018-07-03 LAB — MRSA PCR SCREENING: MRSA by PCR: NEGATIVE

## 2018-07-03 LAB — ECHOCARDIOGRAM COMPLETE
Height: 72 in
Weight: 4169.34 oz

## 2018-07-03 LAB — MAGNESIUM: Magnesium: 1.4 mg/dL — ABNORMAL LOW (ref 1.7–2.4)

## 2018-07-03 MED ORDER — MAGNESIUM SULFATE 2 GM/50ML IV SOLN
2.0000 g | Freq: Once | INTRAVENOUS | Status: AC
  Administered 2018-07-03: 2 g via INTRAVENOUS
  Filled 2018-07-03: qty 50

## 2018-07-03 MED ORDER — ONDANSETRON HCL 4 MG/2ML IJ SOLN
4.0000 mg | Freq: Four times a day (QID) | INTRAMUSCULAR | Status: DC | PRN
  Administered 2018-07-03 – 2018-07-04 (×2): 4 mg via INTRAVENOUS
  Filled 2018-07-03 (×2): qty 2

## 2018-07-03 MED ORDER — AMLODIPINE BESYLATE 5 MG PO TABS
5.0000 mg | ORAL_TABLET | Freq: Every day | ORAL | Status: DC
  Administered 2018-07-03 – 2018-07-07 (×5): 5 mg via ORAL
  Filled 2018-07-03 (×5): qty 1

## 2018-07-03 MED ORDER — LOSARTAN POTASSIUM 50 MG PO TABS
100.0000 mg | ORAL_TABLET | Freq: Every day | ORAL | Status: DC
  Administered 2018-07-03 – 2018-07-07 (×5): 100 mg via ORAL
  Filled 2018-07-03 (×5): qty 2

## 2018-07-03 MED ORDER — METOPROLOL SUCCINATE ER 25 MG PO TB24
25.0000 mg | ORAL_TABLET | Freq: Every day | ORAL | Status: DC
  Administered 2018-07-03 – 2018-07-07 (×5): 25 mg via ORAL
  Filled 2018-07-03 (×5): qty 1

## 2018-07-03 MED ORDER — POTASSIUM CHLORIDE CRYS ER 20 MEQ PO TBCR
40.0000 meq | EXTENDED_RELEASE_TABLET | Freq: Two times a day (BID) | ORAL | Status: AC
  Administered 2018-07-03 (×2): 40 meq via ORAL
  Filled 2018-07-03 (×2): qty 2

## 2018-07-03 NOTE — Progress Notes (Signed)
*  PRELIMINARY RESULTS* Echocardiogram 2D Echocardiogram has been performed.  Leavy Cella 07/03/2018, 2:57 PM

## 2018-07-03 NOTE — Progress Notes (Signed)
Notified by lab, blood cultures with gram + cocci. Notified Dr. Olevia Bowens to make aware. Pt currently on ABTs. Will continue to monitor.

## 2018-07-03 NOTE — Plan of Care (Signed)

## 2018-07-03 NOTE — Progress Notes (Signed)
PROGRESS NOTE    Joel Reed.  ZOX:[id number] DOB: Nov 05, 1950 DOA: 07/02/2018 PCP: Donita Brooks, MD   Brief Narrative:  Per HPI: Joel Reed. is a 68 y.o. male with medical history significant of nonalcoholic liver cirrhosis, celiac disease, diabetes mellitus type 2, hypertension came to the hospital with complaints of fever and fatigue.  Patient states he was feeling well up until yesterday evening but this morning when he woke up he started having subjective fevers and chills with feeling of nausea.  Denies any abdominal pain, shortness of breath, cough or any other symptoms.  Denies any sick contacts. In the ER he was noted to have temperature of 101.1 which went up to 102.3.  COVID-negative.  Saturating greater than 90% on room air.  Chest x-ray showed questionable infiltrate at the bases.  Normal WBC.  While in the ER he had one episode of diarrhea but no more since.  It was nonbloody.  He was started on IV Rocephin and azithromycin admitted to the hospital.  Patient was admitted with fever, initially with unknown etiology, but it now appears that he has gram-positive bacteremia.  He continues to have some nausea and vomiting this morning, but denies any cough or shortness of breath.  Assessment & Plan:   Principal Problem:   Fever Active Problems:   Type II diabetes mellitus, uncontrolled (HCC)   HTN (hypertension), benign   HLD (hyperlipidemia)   PVD (peripheral vascular disease) (HCC)   Gastritis and duodenitis   Celiac disease   Fatigue   NAFLD (nonalcoholic fatty liver disease)   Cirrhosis, non-alcoholic (HCC)   Fever likely secondary to gram + bacteremia -Continue current Rocephin and azithromycin with improvement in fever curve noted -Follow blood cultures to final ID and sensitivity -Obtain 2D echocardiogram for screening and likely TEE soon -Follow respiratory panel -MRSA nares screen pending  Nonbloody diarrhea, one episode -  1 more episode of  diarrhea noted this a.m.; will continue to monitor  Hypomagnesemia/hypokalemia -Replete and reevaluate in a.m.  Diabetes mellitus type 2 with peripheral neuropathy -Hold home medication, insulin sliding scale Accu-Chek; -Maintain current dosing given unpredictable oral intake with some nausea and vomiting this morning  Essential hypertension -Resume home medications and monitor closely  History of nonalcoholic fatty liver disease with cirrhosis -Follows outpatient gastroenterology.  Endoscopy in 2019 showed gastritis and duodenitis.   DVT prophylaxis: Lovenox Code Status: Full Family Communication: Patient will call family Disposition Plan: Continue current IV antibiotics and adjust accordingly.  Follow blood cultures.  2D echocardiogram today for screening of vegetations with likely need for TEE soon for endocarditis evaluation.   Consultants:   None  Procedures:   None  Antimicrobials:   Cefepime 5/20-5/20  Azithromycin 5/20->  Rocephin 5/20->   Subjective: Patient seen and evaluated today with no new acute complaints or concerns. No acute concerns or events noted overnight.  Patient did have 1 loose bowel movement noted this morning along with some nausea.  Objective: Vitals:   07/02/18 1807 07/02/18 2115 07/03/18 0434 07/03/18 0521  BP: 118/62 122/62  (!) 122/57  Pulse: (!) 107 98  91  Resp: 20 (!) 28  (!) 24  Temp: (!) 102.9 F (39.4 C) 98.5 F (36.9 C)  99.1 F (37.3 C)  TempSrc: Oral Oral  Oral  SpO2: 92% 95%  91%  Weight:   118.2 kg   Height:        Intake/Output Summary (Last 24 hours) at 07/03/2018 8119 Last data filed  at 07/03/2018 0300 Gross per 24 hour  Intake 978.7 ml  Output 400 ml  Net 578.7 ml   Filed Weights   07/02/18 1345 07/03/18 0434  Weight: 117.9 kg 118.2 kg    Examination:  General exam: Appears calm and comfortable  Respiratory system: Clear to auscultation. Respiratory effort normal. Cardiovascular system: S1 & S2  heard, RRR. No JVD, murmurs, rubs, gallops or clicks. No pedal edema. Gastrointestinal system: Abdomen is nondistended, soft and nontender. No organomegaly or masses felt. Normal bowel sounds heard. Central nervous system: Alert and oriented. No focal neurological deficits. Extremities: Symmetric 5 x 5 power. Skin: No rashes, lesions or ulcers Psychiatry: Judgement and insight appear normal. Mood & affect appropriate.     Data Reviewed: I have personally reviewed following labs and imaging studies  CBC: Recent Labs  Lab 07/02/18 1356 07/03/18 0627  WBC 7.3 6.2  NEUTROABS 6.3  --   HGB 14.4 12.4*  HCT 42.6 36.9*  MCV 88.6 89.8  PLT 93* 70*   Basic Metabolic Panel: Recent Labs  Lab 07/02/18 1356 07/03/18 0627  NA 137 136  K 3.8 3.2*  CL 101 104  CO2 21* 23  GLUCOSE 223* 191*  BUN 13 14  CREATININE 0.88 0.75  CALCIUM 8.9 7.9*  MG  --  1.4*   GFR: Estimated Creatinine Clearance: 122.1 mL/min (by C-G formula based on SCr of 0.75 mg/dL). Liver Function Tests: Recent Labs  Lab 07/02/18 1356  AST 40  ALT 59*  ALKPHOS 63  BILITOT 1.2  PROT 7.7  ALBUMIN 4.2   No results for input(s): LIPASE, AMYLASE in the last 168 hours. No results for input(s): AMMONIA in the last 168 hours. Coagulation Profile: Recent Labs  Lab 07/02/18 1356  INR 1.5*   Cardiac Enzymes: No results for input(s): CKTOTAL, CKMB, CKMBINDEX, TROPONINI in the last 168 hours. BNP (last 3 results) No results for input(s): PROBNP in the last 8760 hours. HbA1C: No results for input(s): HGBA1C in the last 72 hours. CBG: Recent Labs  Lab 07/02/18 1346 07/02/18 1701 07/02/18 2317 07/03/18 0734  GLUCAP 185* 220* 244* 166*   Lipid Profile: No results for input(s): CHOL, HDL, LDLCALC, TRIG, CHOLHDL, LDLDIRECT in the last 72 hours. Thyroid Function Tests: No results for input(s): TSH, T4TOTAL, FREET4, T3FREE, THYROIDAB in the last 72 hours. Anemia Panel: Recent Labs    07/02/18 1356  FERRITIN  76   Sepsis Labs: Recent Labs  Lab 07/02/18 1346 07/02/18 1356 07/02/18 1543  PROCALCITON <0.10  --   --   LATICACIDVEN  --  4.3* 3.2*    Recent Results (from the past 240 hour(s))  Culture, blood (Routine x 2)     Status: None (Preliminary result)   Collection Time: 07/02/18  1:56 PM  Result Value Ref Range Status   Specimen Description BLOOD RIGHT ARM  Final   Special Requests   Final    BOTTLES DRAWN AEROBIC AND ANAEROBIC Blood Culture adequate volume   Culture  Setup Time   Final    IN BOTH AEROBIC AND ANAEROBIC BOTTLES GRAM POSITIVE COCCI IN CHAINS Gram Stain Report Called to,Read Back By and Verified With: K GAVES,RN @0219  07/03/18 MKELLY    Culture   Final    NO GROWTH < 24 HOURS Performed at [facility], [address] [place], [place] [address]    Report Status PENDING  Incomplete  Culture, blood (Routine x 2)     Status: None (Preliminary result)   Collection Time: 07/02/18  2:01 PM  Result Value Ref Range Status   Specimen Description BLOOD LEFT ARM  Final   Special Requests   Final    BOTTLES DRAWN AEROBIC AND ANAEROBIC Blood Culture adequate volume   Culture  Setup Time   Final    IN BOTH AEROBIC AND ANAEROBIC BOTTLES GRAM POSITIVE COCCI IN CHAINS Gram Stain Report Called to,Read Back By and Verified With: K GRAVES,RN @0219  07/03/18 MKELLY    Culture   Final    NO GROWTH < 24 HOURS Performed at [facility], [address] [place], [place] [address]    Report Status PENDING  Incomplete  SARS Coronavirus 2 (CEPHEID- Performed in [facility] hospital lab), Hosp Order     Status: None   Collection Time: 07/02/18  2:20 PM  Result Value Ref Range Status   SARS Coronavirus 2 NEGATIVE NEGATIVE Final    Comment: (NOTE) If result is NEGATIVE SARS-CoV-2 target nucleic acids are NOT DETECTED. The SARS-CoV-2 RNA is generally detectable in upper and lower  respiratory specimens during the acute phase of infection. The lowest  concentration of SARS-CoV-2 viral  copies this assay can detect is 250  copies / mL. A negative result does not preclude SARS-CoV-2 infection  and should not be used as the sole basis for treatment or other  patient management decisions.  A negative result may occur with  improper specimen collection / handling, submission of specimen other  than nasopharyngeal swab, presence of viral mutation(s) within the  areas targeted by this assay, and inadequate number of viral copies  (<250 copies / mL). A negative result must be combined with clinical  observations, patient history, and epidemiological information. If result is POSITIVE SARS-CoV-2 target nucleic acids are DETECTED. The SARS-CoV-2 RNA is generally detectable in upper and lower  respiratory specimens dur ing the acute phase of infection.  Positive  results are indicative of active infection with SARS-CoV-2.  Clinical  correlation with patient history and other diagnostic information is  necessary to determine patient infection status.  Positive results do  not rule out bacterial infection or co-infection with other viruses. If result is PRESUMPTIVE POSTIVE SARS-CoV-2 nucleic acids MAY BE PRESENT.   A presumptive positive result was obtained on the submitted specimen  and confirmed on repeat testing.  While 2019 novel coronavirus  (SARS-CoV-2) nucleic acids may be present in the submitted sample  additional confirmatory testing may be necessary for epidemiological  and / or clinical management purposes  to differentiate between  SARS-CoV-2 and other Sarbecovirus currently known to infect humans.  If clinically indicated additional testing with an alternate test  methodology [phone]) is advised. The SARS-CoV-2 RNA is generally  detectable in upper and lower respiratory sp ecimens during the acute  phase of infection. The expected result is Negative. Fact Sheet for Patients:  BoilerBrush.com.cy Fact Sheet for Healthcare  Providers: https://pope.com/ This test is not yet approved or cleared by the [place] FDA and has been authorized for detection and/or diagnosis of SARS-CoV-2 by FDA under an Emergency Use Authorization (EUA).  This EUA will remain in effect (meaning this test can be used) for the duration of the COVID-19 declaration under Section 564(b)(1) of the Act, 21 U.S.C. section 360bbb-3(b)(1), unless the authorization is terminated or revoked sooner. Performed at [facility], [address], [place], [place] [address]          Radiology Studies: Dg Chest [facility] 1 View  Result Date: 07/02/2018 CLINICAL DATA:  Sepsis EXAM: PORTABLE CHEST 1  VIEW COMPARISON:  08/29/2016 FINDINGS: Evaluation is limited by technique and body habitus. There is diffuse increased attenuation of the lung bases bilaterally. There is no pneumothorax. No large pleural effusion. The cardiac silhouette is within normal limits. IMPRESSION: 1. Evaluation limited by technique. 2. Diffuse increased attenuation of the lung bases bilaterally may be secondary to soft tissue attenuation artifact. An underlying infiltrate is difficult to exclude. A follow-up two-view chest x-ray is recommended for further evaluation. Electronically Signed   By: Katherine Mantle M.D.   On: 07/02/2018 15:17        Scheduled Meds:  enoxaparin (LOVENOX) injection  60 mg Subcutaneous Q24H   insulin aspart  0-15 Units Subcutaneous TID WC   insulin aspart  0-5 Units Subcutaneous QHS   potassium chloride  40 mEq Oral BID   Continuous Infusions:  sodium chloride 75 mL/hr at 07/02/18 1657   azithromycin 500 mg (07/02/18 1853)   cefTRIAXone (ROCEPHIN)  IV 1 g (07/02/18 2100)   magnesium sulfate bolus IVPB       LOS: 0 days    Time spent: 30 minutes    Celestino Ackerman Hoover Brunette, DO [facility] Pager [phone]  If 7PM-7AM, please contact night-coverage [email or web address] Password [facility] 07/03/2018, 9:33 AM

## 2018-07-04 DIAGNOSIS — R7881 Bacteremia: Principal | ICD-10-CM

## 2018-07-04 LAB — CBC
HCT: 36.2 % — ABNORMAL LOW (ref 39.0–52.0)
Hemoglobin: 12.4 g/dL — ABNORMAL LOW (ref 13.0–17.0)
MCH: 30.5 pg (ref 26.0–34.0)
MCHC: 34.3 g/dL (ref 30.0–36.0)
MCV: 89.2 fL (ref 80.0–100.0)
Platelets: 67 10*3/uL — ABNORMAL LOW (ref 150–400)
RBC: 4.06 MIL/uL — ABNORMAL LOW (ref 4.22–5.81)
RDW: 13.6 % (ref 11.5–15.5)
WBC: 4.7 10*3/uL (ref 4.0–10.5)
nRBC: 0 % (ref 0.0–0.2)

## 2018-07-04 LAB — BASIC METABOLIC PANEL
Anion gap: 8 (ref 5–15)
BUN: 12 mg/dL (ref 8–23)
CO2: 23 mmol/L (ref 22–32)
Calcium: 7.7 mg/dL — ABNORMAL LOW (ref 8.9–10.3)
Chloride: 105 mmol/L (ref 98–111)
Creatinine, Ser: 0.77 mg/dL (ref 0.61–1.24)
GFR calc Af Amer: >60 mL/min (ref >60)
GFR calc non Af Amer: >60 mL/min (ref >60)
Glucose, Bld: 148 mg/dL — ABNORMAL HIGH (ref 70–99)
Potassium: 4 mmol/L (ref 3.5–5.1)
Sodium: 136 mmol/L (ref 135–145)

## 2018-07-04 LAB — GLUCOSE, CAPILLARY
Glucose-Capillary: 151 mg/dL — ABNORMAL HIGH (ref 70–99)
Glucose-Capillary: 197 mg/dL — ABNORMAL HIGH (ref 70–99)
Glucose-Capillary: 141 mg/dL — ABNORMAL HIGH (ref 70–99)
Glucose-Capillary: 192 mg/dL — ABNORMAL HIGH (ref 70–99)

## 2018-07-04 LAB — MAGNESIUM: Magnesium: 2 mg/dL (ref 1.7–2.4)

## 2018-07-04 NOTE — Care Management Important Message (Signed)
Important Message  Patient Details  Name: Joel Reed. MRN: [id number] Date of Birth: 02/21/1950   Medicare Important Message Given:  Yes    Tommy Medal 07/04/2018, 4:16 PM

## 2018-07-04 NOTE — Progress Notes (Signed)
PROGRESS NOTE    Joel Reed.  ZOX:[id number] DOB: 1950/06/06 DOA: 07/02/2018 PCP: Donita Brooks, MD   Brief Narrative:  Per HPI: Joel Reedis a 68 y.o.malewith medical history significant ofnonalcoholic liver cirrhosis, celiac disease, diabetes mellitus type 2, hypertension came to the hospital with complaints of fever and fatigue. Patient states he was feeling well up until yesterday evening but this morning when he woke up he started having subjective fevers and chills with feeling of nausea. Denies any abdominal pain, shortness of breath, cough or any other symptoms. Denies any sick contacts. In the ER he was noted to have temperature of 101.1 which went up to 102.3. COVID-negative. Saturating greater than 90% on room air. Chest x-ray showed questionable infiltrate at the bases. Normal WBC. While in the ER he had one episode of diarrhea but no more since. It was nonbloody. He was started on IV Rocephin and azithromycin admitted to the hospital.  Patient was admitted with fever, initially with unknown etiology, but it now appears that he has gram-positive bacteremia.  He denies any further symptomatology and has been without fever in the last 24 hours.  Assessment & Plan:   Principal Problem:   Fever Active Problems:   Type II diabetes mellitus, uncontrolled (HCC)   HTN (hypertension), benign   HLD (hyperlipidemia)   PVD (peripheral vascular disease) (HCC)   Gastritis and duodenitis   Celiac disease   Fatigue   NAFLD (nonalcoholic fatty liver disease)   Cirrhosis, non-alcoholic (HCC)   Bacteremia due to Gram-positive bacteria  Fever likely secondary to gram + bacteremia -Continue current Rocephin and azithromycin with improvement in fever curve noted -Follow blood cultures to final ID and sensitivity which are still pending -TTE with no signs of vegetations.  Asked cardiology to evaluate for TEE and have recommended not doing so at this time unless  cultures on repeat come back positive for further fevers. -Repeat blood cultures today -Respiratory panel negative -MRSA nares negative  Nonbloody diarrhea, one episode -  No further episodes at this time  Hypomagnesemia/hypokalemia -Repleted  Diabetes mellitus type 2 with peripheral neuropathy -Hold home medication, insulin sliding scale Accu-Chek; -Maintain current dosing given unpredictable oral intake with some nausea and vomiting this morning  Essential hypertension -Resume home medications and monitor closely  History of nonalcoholic fatty liver disease with cirrhosis -Follows outpatient gastroenterology. Endoscopy in 2019 showed gastritis and duodenitis.   DVT prophylaxis: Lovenox Code Status: Full Family Communication: Patient will call family Disposition Plan: Continue current IV antibiotics and adjust accordingly based on further results.  Follow blood cultures.  Transthoracic echocardiogram without any significant findings.  Cardiology with no recommendations for TEE at this time.   Consultants:   Cardiology  Procedures:   None  Antimicrobials:   Cefepime 5/20-5/20  Azithromycin 5/20->  Rocephin 5/20->  Subjective: Patient seen and evaluated today with no new acute complaints or concerns. No acute concerns or events noted overnight.  Objective: Vitals:   07/03/18 0521 07/03/18 1319 07/03/18 2238 07/04/18 0500  BP: (!) 122/57 (!) 102/53 136/67   Pulse: 91 82 86   Resp: (!) 24 16 20    Temp: 99.1 F (37.3 C) 98.9 F (37.2 C) 98.4 F (36.9 C)   TempSrc: Oral Oral Oral   SpO2: 91% 96% 95%   Weight:    119.2 kg  Height:        Intake/Output Summary (Last 24 hours) at 07/04/2018 1027 Last data filed at 07/04/2018 0044 Gross per 24  hour  Intake 120 ml  Output 350 ml  Net -230 ml   Filed Weights   07/02/18 1345 07/03/18 0434 07/04/18 0500  Weight: 117.9 kg 118.2 kg 119.2 kg    Examination:  General exam: Appears calm and  comfortable  Respiratory system: Clear to auscultation. Respiratory effort normal. Cardiovascular system: S1 & S2 heard, RRR. No JVD, murmurs, rubs, gallops or clicks. No pedal edema. Gastrointestinal system: Abdomen is nondistended, soft and nontender. No organomegaly or masses felt. Normal bowel sounds heard. Central nervous system: Alert and oriented. No focal neurological deficits. Extremities: Symmetric 5 x 5 power. Skin: No rashes, lesions or ulcers Psychiatry: Judgement and insight appear normal. Mood & affect appropriate.     Data Reviewed: I have personally reviewed following labs and imaging studies  CBC: Recent Labs  Lab 07/02/18 1356 07/03/18 0627 07/04/18 0515  WBC 7.3 6.2 4.7  NEUTROABS 6.3  --   --   HGB 14.4 12.4* 12.4*  HCT 42.6 36.9* 36.2*  MCV 88.6 89.8 89.2  PLT 93* 70* 67*   Basic Metabolic Panel: Recent Labs  Lab 07/02/18 1356 07/03/18 0627 07/04/18 0515  NA 137 136 136  K 3.8 3.2* 4.0  CL 101 104 105  CO2 21* 23 23  GLUCOSE 223* 191* 148*  BUN 13 14 12   CREATININE 0.88 0.75 0.77  CALCIUM 8.9 7.9* 7.7*  MG  --  1.4* 2.0   GFR: Estimated Creatinine Clearance: 122.7 mL/min (by C-G formula based on SCr of 0.77 mg/dL). Liver Function Tests: Recent Labs  Lab 07/02/18 1356  AST 40  ALT 59*  ALKPHOS 63  BILITOT 1.2  PROT 7.7  ALBUMIN 4.2   No results for input(s): LIPASE, AMYLASE in the last 168 hours. No results for input(s): AMMONIA in the last 168 hours. Coagulation Profile: Recent Labs  Lab 07/02/18 1356  INR 1.5*   Cardiac Enzymes: No results for input(s): CKTOTAL, CKMB, CKMBINDEX, TROPONINI in the last 168 hours. BNP (last 3 results) No results for input(s): PROBNP in the last 8760 hours. HbA1C: No results for input(s): HGBA1C in the last 72 hours. CBG: Recent Labs  Lab 07/03/18 0734 07/03/18 1112 07/03/18 1625 07/03/18 2106 07/04/18 0832  GLUCAP 166* 163* 156* 148* 151*   Lipid Profile: No results for input(s):  CHOL, HDL, LDLCALC, TRIG, CHOLHDL, LDLDIRECT in the last 72 hours. Thyroid Function Tests: No results for input(s): TSH, T4TOTAL, FREET4, T3FREE, THYROIDAB in the last 72 hours. Anemia Panel: Recent Labs    07/02/18 1356  FERRITIN 76   Sepsis Labs: Recent Labs  Lab 07/02/18 1346 07/02/18 1356 07/02/18 1543  PROCALCITON <0.10  --   --   LATICACIDVEN  --  4.3* 3.2*    Recent Results (from the past 240 hour(s))  Culture, blood (Routine x 2)     Status: None (Preliminary result)   Collection Time: 07/02/18  1:56 PM  Result Value Ref Range Status   Specimen Description   Final    BLOOD RIGHT ARM Performed at [facility], [address] [place], [place] [address]    Special Requests   Final    BOTTLES DRAWN AEROBIC AND ANAEROBIC Blood Culture adequate volume Performed at [facility], [address] [place], [place] [address]    Culture  Setup Time   Final    IN BOTH AEROBIC AND ANAEROBIC BOTTLES GRAM POSITIVE COCCI IN CHAINS Gram Stain Report Called to,Read Back By and Verified With: K GAVES,RN @0219  07/03/18 MKELLY Performed at [facility], [address] [place], [place] [address]    Culture [facility] POSITIVE COCCI  Final   Report Status PENDING  Incomplete  Culture, blood (Routine x 2)     Status: None (Preliminary result)   Collection Time: 07/02/18  2:01 PM  Result Value Ref Range Status   Specimen Description   Final    BLOOD LEFT ARM Performed at [facility], [address], [place], [place] [address]    Special Requests   Final    BOTTLES DRAWN AEROBIC AND ANAEROBIC Blood Culture adequate volume Performed at [facility], [address] [place], [place] [address]    Culture  Setup Time   Final    IN BOTH AEROBIC AND ANAEROBIC BOTTLES GRAM POSITIVE COCCI IN CHAINS Gram Stain Report Called to,Read Back By and Verified With: K GRAVES,RN @0219  07/03/18 [facility] Performed at [facility], [address] [place], [place] [address]    Culture [facility] POSITIVE COCCI   Final   Report Status PENDING  Incomplete  SARS Coronavirus 2 (CEPHEID- Performed in [facility] hospital lab), Hosp Order     Status: None   Collection Time: 07/02/18  2:20 PM  Result Value Ref Range Status   SARS Coronavirus 2 NEGATIVE NEGATIVE Final    Comment: (NOTE) If result is NEGATIVE SARS-CoV-2 target nucleic acids are NOT DETECTED. The SARS-CoV-2 RNA is generally detectable in upper and lower  respiratory specimens during the acute phase of infection. The lowest  concentration of SARS-CoV-2 viral copies this assay can detect is 250  copies / mL. A negative result does not preclude SARS-CoV-2 infection  and should not be used as the sole basis for treatment or other  patient management decisions.  A negative result may occur with  improper specimen collection / handling, submission of specimen other  than nasopharyngeal swab, presence of viral mutation(s) within the  areas targeted by this assay, and inadequate number of viral copies  (<250 copies / mL). A negative result must be combined with clinical  observations, patient history, and epidemiological information. If result is POSITIVE SARS-CoV-2 target nucleic acids are DETECTED. The SARS-CoV-2 RNA is generally detectable in upper and lower  respiratory specimens dur ing the acute phase of infection.  Positive  results are indicative of active infection with SARS-CoV-2.  Clinical  correlation with patient history and other diagnostic information is  necessary to determine patient infection status.  Positive results do  not rule out bacterial infection or co-infection with other viruses. If result is PRESUMPTIVE POSTIVE SARS-CoV-2 nucleic acids MAY BE PRESENT.   A presumptive positive result was obtained on the submitted specimen  and confirmed on repeat testing.  While 2019 novel coronavirus  (SARS-CoV-2) nucleic acids may be present in the submitted sample  additional confirmatory testing may be necessary for  epidemiological  and / or clinical management purposes  to differentiate between  SARS-CoV-2 and other Sarbecovirus currently known to infect humans.  If clinically indicated additional testing with an alternate test  methodology [phone]) is advised. The SARS-CoV-2 RNA is generally  detectable in upper and lower respiratory sp ecimens during the acute  phase of infection. The expected result is Negative. Fact Sheet for Patients:  BoilerBrush.com.cy Fact Sheet for Healthcare Providers: https://pope.com/ This test is not yet approved or cleared by the [place] FDA and has been authorized for detection and/or diagnosis of SARS-CoV-2 by FDA under an Emergency Use Authorization (EUA).  This EUA will remain in effect (meaning this test can be used)  for the duration of the COVID-19 declaration under Section 564(b)(1) of the Act, 21 U.S.C. section 360bbb-3(b)(1), unless the authorization is terminated or revoked sooner. Performed at [facility], [address] [place], [place] [address]   Respiratory Panel by PCR     Status: None   Collection Time: 07/02/18  4:34 PM  Result Value Ref Range Status   Adenovirus NOT DETECTED NOT DETECTED Final   Coronavirus 229E NOT DETECTED NOT DETECTED Final    Comment: (NOTE) The Coronavirus on the Respiratory Panel, DOES NOT test for the novel  Coronavirus (2019 nCoV)    Coronavirus HKU1 NOT DETECTED NOT DETECTED Final   Coronavirus NL63 NOT DETECTED NOT DETECTED Final   Coronavirus OC43 NOT DETECTED NOT DETECTED Final   Metapneumovirus NOT DETECTED NOT DETECTED Final   Rhinovirus / Enterovirus NOT DETECTED NOT DETECTED Final   Influenza A NOT DETECTED NOT DETECTED Final   Influenza B NOT DETECTED NOT DETECTED Final   Parainfluenza Virus 1 NOT DETECTED NOT DETECTED Final   Parainfluenza Virus 2 NOT DETECTED NOT DETECTED Final   Parainfluenza Virus 3 NOT DETECTED NOT DETECTED Final   Parainfluenza  Virus 4 NOT DETECTED NOT DETECTED Final   Respiratory Syncytial Virus NOT DETECTED NOT DETECTED Final   Bordetella pertussis NOT DETECTED NOT DETECTED Final   Chlamydophila pneumoniae NOT DETECTED NOT DETECTED Final   Mycoplasma pneumoniae NOT DETECTED NOT DETECTED Final    Comment: Performed at [facility], [address] [place], [place] [address]  MRSA PCR Screening     Status: None   Collection Time: 07/03/18  8:12 AM  Result Value Ref Range Status   MRSA by PCR NEGATIVE NEGATIVE Final    Comment:        The GeneXpert MRSA Assay (FDA approved for NASAL specimens only), is one component of a comprehensive MRSA colonization surveillance program. It is not intended to diagnose MRSA infection nor to guide or monitor treatment for MRSA infections. Performed at [facility], [address], [place], [place] [address]          Radiology Studies: Dg Chest [facility] 1 View  Result Date: 07/02/2018 CLINICAL DATA:  Sepsis EXAM: PORTABLE CHEST 1 VIEW COMPARISON:  08/29/2016 FINDINGS: Evaluation is limited by technique and body habitus. There is diffuse increased attenuation of the lung bases bilaterally. There is no pneumothorax. No large pleural effusion. The cardiac silhouette is within normal limits. IMPRESSION: 1. Evaluation limited by technique. 2. Diffuse increased attenuation of the lung bases bilaterally may be secondary to soft tissue attenuation artifact. An underlying infiltrate is difficult to exclude. A follow-up two-view chest x-ray is recommended for further evaluation. Electronically Signed   By: Katherine Mantle M.D.   On: 07/02/2018 15:17        Scheduled Meds: . amLODipine  5 mg Oral Daily  . enoxaparin (LOVENOX) injection  60 mg Subcutaneous Q24H  . insulin aspart  0-15 Units Subcutaneous TID WC  . insulin aspart  0-5 Units Subcutaneous QHS  . losartan  100 mg Oral Daily  . metoprolol succinate  25 mg Oral Daily   Continuous Infusions: . sodium chloride  75 mL/hr at 07/04/18 0337  . azithromycin 500 mg (07/03/18 1834)  . cefTRIAXone (ROCEPHIN)  IV 1 g (07/03/18 2229)     LOS: 1 day    Time spent: 30 minutes    Avo Schlachter Hoover Brunette, DO [facility] Pager [phone]  If 7PM-7AM, please contact night-coverage [email or web address] Password [facility] 07/04/2018, 10:27 AM

## 2018-07-04 NOTE — Plan of Care (Signed)

## 2018-07-04 NOTE — Consult Note (Signed)
Cardiology Consultation:   Patient ID: Joel Reed. MRN: [id number]; DOB: [birth year]  Admit date: 07/02/2018 Date of Consult: 07/04/2018  Primary Care Provider: Susy Frizzle, MD Primary Cardiologist: No primary care provider on file. New to [facility] Primary Electrophysiologist:  None    Patient Profile:   Joel Reed. is a 68 y.o. male with a hx of NIDDM, HTN, unprovoked DVT on Xarelto, non alcoholic cirrhosis, and anemia who is being seen today for the evaluation of bacturemia at the request of Dr Manuella Ghazi.  History of Present Illness:   Joel Reed is a 68 year old male with a history of non-insulin-dependent diabetes, hypertension, unprovoked right lower extremity DVT in November 2019 with negative hematology work-up, currently on Xarelto, and a history of nonalcoholic cirrhosis.  He also has anemia, he had angiodysplasia on a colonoscopy in November 2019.  On endoscopy in 2019 he had gastritis and duodenitis.  The patient has no prior cardiac history, there is no previous cardiac records in EP [facility].  The patient was admitted 07/02/2018 with fever.  Blood cultures are positive for gram-positive cocci.  Cardiology was asked to evaluate for possible TEE.  Transthoracic echocardiogram done 07/03/2018 showed no evidence of vegetation.  Past Medical History:  Diagnosis Date   Allergy    Celiac disease    Cirrhosis ([place])    Diabetes mellitus without complication ([place])    Gastritis and duodenitis    on egd 12/2017   Hyperlipidemia    Patient denies   Hypertension    IDA (iron deficiency anemia)    Internal hemorrhoids    Nephrolithiasis    PVD (peripheral vascular disease) ([place])    decreased TBI in right foot.   Sleep apnea    Tubular adenoma of colon     Past Surgical History:  Procedure Laterality Date   COLONOSCOPY     JOINT REPLACEMENT     left hip Dr Noemi Chapel   SPINE SURGERY     disectomy     Home Medications:  Prior to Admission medications     Medication Sig Start Date End Date Taking? Authorizing Provider  amLODipine (NORVASC) 5 MG tablet TAKE 1 TABLET BY MOUTH EVERY DAY Patient taking differently: Take 5 mg by mouth every morning.  05/05/18  Yes Susy Frizzle, MD  cholecalciferol (VITAMIN D3) 25 MCG (1000 UT) tablet Take 1,000 Units by mouth daily.   Yes [provider]  fluticasone (FLONASE) 50 MCG/ACT nasal spray Place 2 sprays into both nostrils daily. Patient taking differently: Place 2 sprays into both nostrils daily as needed for allergies or rhinitis.  03/12/18  Yes Delsa Grana, PA-C  glimepiride (AMARYL) 4 MG tablet Take 0.5 tablets (2 mg total) by mouth daily. 03/20/18  Yes Susy Frizzle, MD  losartan (COZAAR) 100 MG tablet Take 1 tablet by mouth once daily Patient taking differently: Take 100 mg by mouth every morning.  06/30/18  Yes Susy Frizzle, MD  metFORMIN (GLUCOPHAGE) 500 MG tablet TAKE 1 TABLET BY MOUTH 2 TIMES DAILY WITH A MEAL. Patient taking differently: Take 500 mg by mouth 2 (two) times daily with a meal.  05/05/18  Yes Susy Frizzle, MD  metoprolol succinate (TOPROL-XL) 25 MG 24 hr tablet Take 1 tablet by mouth once daily Patient taking differently: Take 25 mg by mouth daily.  06/30/18  Yes Susy Frizzle, MD  pantoprazole (PROTONIX) 40 MG tablet Take 1 tablet (40 mg total) by mouth daily. 03/12/18  Yes Delsa Grana, PA-C  vitamin B-12 (CYANOCOBALAMIN) 500 MCG tablet Take 500 mcg by mouth daily.   Yes [provider]  XARELTO 20 MG TABS tablet TAKE 1 TABLET BY MOUTH DAILY WITH SUPPER. Patient taking differently: Take 20 mg by mouth daily with supper.  01/30/18  Yes Susy Frizzle, MD    Inpatient Medications: Scheduled Meds:  amLODipine  5 mg Oral Daily   enoxaparin (LOVENOX) injection  60 mg Subcutaneous Q24H   insulin aspart  0-15 Units Subcutaneous TID WC   insulin aspart  0-5 Units Subcutaneous QHS   losartan  100 mg Oral Daily   metoprolol succinate  25 mg  Oral Daily   Continuous Infusions:  sodium chloride 75 mL/hr at 07/04/18 0337   azithromycin 500 mg (07/03/18 1834)   cefTRIAXone (ROCEPHIN)  IV 1 g (07/03/18 2229)   PRN Meds: acetaminophen **OR** acetaminophen, bisacodyl, HYDROcodone-acetaminophen, ipratropium-albuterol, ondansetron (ZOFRAN) IV, polyethylene glycol, senna-docusate, sodium phosphate  Allergies:    Allergies  Allergen Reactions   Gluten Meal     Social History:   Social History   Socioeconomic History   Marital status: Married    Spouse name: Not on file   Number of children: Not on file   Years of education: Not on file   Highest education level: Not on file  Occupational History   Not on file  Social Needs   Financial resource strain: Not on file   Food insecurity:    Worry: Not on file    Inability: Not on file   Transportation needs:    Medical: Not on file    Non-medical: Not on file  Tobacco Use   Smoking status: Never Smoker   Smokeless tobacco: Never Used  Substance and Sexual Activity   Alcohol use: No   Drug use: No   Sexual activity: Not on file  Lifestyle   Physical activity:    Days per week: Not on file    Minutes per session: Not on file   Stress: Not on file  Relationships   Social connections:    Talks on phone: Not on file    Gets together: Not on file    Attends religious service: Not on file    Active member of club or organization: Not on file    Attends meetings of clubs or organizations: Not on file    Relationship status: Not on file   Intimate partner violence:    Fear of current or ex partner: Not on file    Emotionally abused: Not on file    Physically abused: Not on file    Forced sexual activity: Not on file  Other Topics Concern   Not on file  Social History Narrative   Not on file    Family History:    Family History  Problem Relation Age of Onset   Esophageal cancer Father    Breast cancer Sister        bone cancer    Heart attack Brother 70   Cancer - Other Sister        cervical?   Colon cancer Neg Hx    Rectal cancer Neg Hx    Stomach cancer Neg Hx      ROS:  Please see the history of present illness.  All other ROS reviewed and negative.     Physical Exam/Data:   Vitals:   07/03/18 0521 07/03/18 1319 07/03/18 2238 07/04/18 0500  BP: (!) 122/57 (!) 102/53 136/67   Pulse: 91 82 86  Resp: (!) 24 16 20    Temp: 99.1 F (37.3 C) 98.9 F (37.2 C) 98.4 F (36.9 C)   TempSrc: Oral Oral Oral   SpO2: 91% 96% 95%   Weight:    119.2 kg  Height:        Intake/Output Summary (Last 24 hours) at 07/04/2018 0912 Last data filed at 07/04/2018 0044 Gross per 24 hour  Intake 120 ml  Output 350 ml  Net -230 ml   Last 3 Weights 07/04/2018 07/03/2018 07/02/2018  Weight (lbs) 262 lb 12.6 oz 260 lb 9.3 oz 260 lb  Weight (kg) 119.2 kg 118.2 kg 117.935 kg     Body mass index is 35.64 kg/m.  General:  Well nourished, well developed, in no acute distress  HEENT: normal Lymph: no adenopathy Neck: no JVD Endocrine:  No thryomegaly Vascular: No carotid bruits; FA pulses 2+ bilaterally without bruits  Cardiac:  normal S1, S2; RRR; no murmur   Lungs:  clear to auscultation bilaterally, no wheezing, rhonchi or rales  Abd: soft, nontender, no hepatomegaly  Ext: no edema Musculoskeletal:  No deformities, BUE and BLE strength normal and equal Skin: warm and dry  Neuro:  CNs 2-12 intact, no focal abnormalities noted Psych:  Normal affect   EKG:  The EKG 07/03/2018 was personally reviewed and demonstrates:  NSR, ST at 108 Telemetry:  Telemetry was personally reviewed and demonstrates:      Relevant CV Studies: Echo 07/03/2018- IMPRESSIONS    1. The left ventricle has normal systolic function with an ejection fraction of 60-65%. The cavity size was normal. There is mildly increased left ventricular wall thickness. Left ventricular diastolic parameters were normal. No evidence of left  ventricular  regional wall motion abnormalities.  2. The right ventricle has normal systolic function. The cavity was normal. There is no increase in right ventricular wall thickness. Right ventricular systolic pressure could not be assessed.  3. The aortic valve is tricuspid. Mild calcification of the aortic valve. Mild aortic annular calcification noted.  4. The mitral valve is grossly normal. Mild thickening of the mitral valve leaflet. There is mild mitral annular calcification present. Calcification and shadowing noted, but no definitive vegetations based on available views.  5. The tricuspid valve is grossly normal.  6. The aortic root is normal in size and structure.  7. The inferior vena cava was dilated in size with >50% respiratory variability.  Laboratory Data:  Chemistry Recent Labs  Lab 07/02/18 1356 07/03/18 0627 07/04/18 0515  NA 137 136 136  K 3.8 3.2* 4.0  CL 101 104 105  CO2 21* 23 23  GLUCOSE 223* 191* 148*  BUN 13 14 12   CREATININE 0.88 0.75 0.77  CALCIUM 8.9 7.9* 7.7*  GFRNONAA >60 >60 >60  GFRAA >60 >60 >60  ANIONGAP 15 9 8     Recent Labs  Lab 07/02/18 1356  PROT 7.7  ALBUMIN 4.2  AST 40  ALT 59*  ALKPHOS 63  BILITOT 1.2   Hematology Recent Labs  Lab 07/02/18 1356 07/03/18 0627 07/04/18 0515  WBC 7.3 6.2 4.7  RBC 4.81 4.11* 4.06*  HGB 14.4 12.4* 12.4*  HCT 42.6 36.9* 36.2*  MCV 88.6 89.8 89.2  MCH 29.9 30.2 30.5  MCHC 33.8 33.6 34.3  RDW 12.9 13.2 13.6  PLT 93* 70* 67*   Cardiac EnzymesNo results for input(s): TROPONINI in the last 168 hours. No results for input(s): TROPIPOC in the last 168 hours.  BNP Recent Labs  Lab 07/02/18 1346  BNP  138.0*    DDimer  Recent Labs  Lab 07/02/18 1346  DDIMER 0.28    Radiology/Studies:  Dg Chest Port 1 View  Result Date: 07/02/2018 CLINICAL DATA:  Sepsis EXAM: PORTABLE CHEST 1 VIEW COMPARISON:  08/29/2016 FINDINGS: Evaluation is limited by technique and body habitus. There is diffuse increased  attenuation of the lung bases bilaterally. There is no pneumothorax. No large pleural effusion. The cardiac silhouette is within normal limits. IMPRESSION: 1. Evaluation limited by technique. 2. Diffuse increased attenuation of the lung bases bilaterally may be secondary to soft tissue attenuation artifact. An underlying infiltrate is difficult to exclude. A follow-up two-view chest x-ray is recommended for further evaluation. Electronically Signed   By: Constance Holster M.D.   On: 07/02/2018 15:17    Assessment and Plan:   Bacteremia- Gm + cocci, no evidence of endocarditis on TTE.  HTN- Controlled-mild LVH on echo, normal diastolic parameters   NIDDM- On oral agents- Hgb A1c-7.0 in Aug 2019  Unprovoked RLE DVT- Nov 2019- on Xarelto. Negative Heme evaluation for hypercoagulable state  Anemia- H/O gastritis and duadenitis by endoscopy Nov 2019, multiple angioectasias without bleeding noted on colonoscopy Nov 2019.  Plan: will hold of on TEE for now per Dr Johnsie Cancel.   [facility] will sign off.   Medication Recommendations:  None  Other recommendations (labs, testing, etc):  None  Follow up as an outpatient:  No   For questions or updates, please contact [facility] Please consult [email or web address] for contact info under     Signed, Kerin Ransom, PA-C  07/04/2018 9:12 AM   Patient examined chart reviewed. Obese white male Lungs clear no murmur abdomen soft Chronic venous insufficiency with brawny edema right greater than left. No skin stigmata of SBE Review of TTE shows no evidence of SBE. Gram positive cocci in blood cultures not identified yet. No need for TEE unless BC;s fail to clear or he has persistent fevers. Avoiding aerosolized procedures with COVID and no anesthesia coverage for procedure today  Jenkins Rouge

## 2018-07-05 ENCOUNTER — Other Ambulatory Visit: Payer: Self-pay

## 2018-07-05 LAB — BASIC METABOLIC PANEL
Anion gap: 9 (ref 5–15)
BUN: 11 mg/dL (ref 8–23)
CO2: 25 mmol/L (ref 22–32)
Calcium: 7.9 mg/dL — ABNORMAL LOW (ref 8.9–10.3)
Chloride: 103 mmol/L (ref 98–111)
Creatinine, Ser: 0.67 mg/dL (ref 0.61–1.24)
GFR calc Af Amer: >60 mL/min (ref >60)
GFR calc non Af Amer: >60 mL/min (ref >60)
Glucose, Bld: 168 mg/dL — ABNORMAL HIGH (ref 70–99)
Potassium: 3.7 mmol/L (ref 3.5–5.1)
Sodium: 137 mmol/L (ref 135–145)

## 2018-07-05 LAB — CBC
HCT: 36.5 % — ABNORMAL LOW (ref 39.0–52.0)
Hemoglobin: 12.3 g/dL — ABNORMAL LOW (ref 13.0–17.0)
MCH: 29.9 pg (ref 26.0–34.0)
MCHC: 33.7 g/dL (ref 30.0–36.0)
MCV: 88.8 fL (ref 80.0–100.0)
Platelets: 83 10*3/uL — ABNORMAL LOW (ref 150–400)
RBC: 4.11 MIL/uL — ABNORMAL LOW (ref 4.22–5.81)
RDW: 13.3 % (ref 11.5–15.5)
WBC: 4.3 10*3/uL (ref 4.0–10.5)
nRBC: 0 % (ref 0.0–0.2)

## 2018-07-05 LAB — CULTURE, BLOOD (ROUTINE X 2)
Special Requests: ADEQUATE
Special Requests: ADEQUATE

## 2018-07-05 LAB — MAGNESIUM: Magnesium: 2.1 mg/dL (ref 1.7–2.4)

## 2018-07-05 LAB — GLUCOSE, CAPILLARY
Glucose-Capillary: 149 mg/dL — ABNORMAL HIGH (ref 70–99)
Glucose-Capillary: 214 mg/dL — ABNORMAL HIGH (ref 70–99)
Glucose-Capillary: 133 mg/dL — ABNORMAL HIGH (ref 70–99)
Glucose-Capillary: 135 mg/dL — ABNORMAL HIGH (ref 70–99)

## 2018-07-05 MED ORDER — SODIUM CHLORIDE 0.9 % IV SOLN
2.0000 g | INTRAVENOUS | Status: DC
  Administered 2018-07-05 – 2018-07-07 (×3): 2 g via INTRAVENOUS
  Filled 2018-07-05 (×3): qty 20

## 2018-07-05 NOTE — Progress Notes (Signed)
PROGRESS NOTE    Noah Delaine.  QMV:[id number] DOB: 1950/02/25 DOA: 07/02/2018 PCP: Donita Brooks, MD   Brief Narrative:  Per HPI: Donalda Ewings Jr.is a 68 y.o.malewith medical history significant ofnonalcoholic liver cirrhosis, celiac disease, diabetes mellitus type 2, hypertension came to the hospital with complaints of fever and fatigue. Patient states he was feeling well up until yesterday evening but this morning when he woke up he started having subjective fevers and chills with feeling of nausea. Denies any abdominal pain, shortness of breath, cough or any other symptoms. Denies any sick contacts. In the ER he was noted to have temperature of 101.1 which went up to 102.3. COVID-negative. Saturating greater than 90% on room air. Chest x-ray showed questionable infiltrate at the bases. Normal WBC. While in the ER he had one episode of diarrhea but no more since. It was nonbloody. He was started on IV Rocephin and azithromycin admitted to the hospital.  Patient was admitted with fever, initially with unknown etiology, but it now appears that he has gram-positive bacteremia. He denies any further symptomatology and has been without fever in the last 48 hours.  Consulted with cardiology on 5/22 who stated that TEE currently appears unnecessary.  Unfortunately, no source of infection is otherwise noted.  Discussed with ID Dr. Algis Liming earlier this morning on 5/23 and increased dose of Rocephin for bacteremia coverage.  Repeat blood cultures are still pending.  Patient will likely require 4 weeks of antibiotic treatment on Rocephin once PICC line is placed and blood cultures are noted to be clearing.  Assessment & Plan:   Principal Problem:   Fever Active Problems:   Type II diabetes mellitus, uncontrolled (HCC)   HTN (hypertension), benign   HLD (hyperlipidemia)   PVD (peripheral vascular disease) (HCC)   Gastritis and duodenitis   Celiac disease   Fatigue   NAFLD  (nonalcoholic fatty liver disease)   Cirrhosis, non-alcoholic (HCC)   Bacteremia due to Gram-positive bacteria   Feversecondary to strep group G bacteremia with undetermined source -Continue Rocephin IV with dose increased to 2 g daily as discussed with ID -Repeat blood cultures currently pending to document clearance -Anticipate PICC line placement and 4 weeks of antibiotic therapy on discharge -Plan to discontinue azithromycin today -Appreciate ID recommendations -Cardiology consulted on 5/22 and did not feel TEE was currently needed unless persistent fevers or repeat growth on cultures noted.  Nonbloody diarrhea, one episode - No further episodes at this time  Hypomagnesemia/hypokalemia -Repleted  Diabetes mellitus type 2 with peripheral neuropathy -Hold home medication, insulin sliding scale Accu-Chek; -Maintain current dosing given unpredictable oral intake with some nausea and vomiting this morning  Essential hypertension -Resume home medications and monitor closely  History of nonalcoholic fatty liver disease with cirrhosis -Follows outpatient gastroenterology. Endoscopy in 2019 showed gastritis and duodenitis.   DVT prophylaxis:Lovenox Code Status:Full Family Communication:Patient will call family Disposition Plan:Continue current IV antibiotics as noted below.  Awaiting repeat blood culture so that PICC line can be placed.  Patient will likely need 4 weeks of IV antibiotics on discharge.   Consultants:  Cardiology  ID on file  Procedures:  None  Antimicrobials:  Cefepime 5/20-5/20  Azithromycin 5/20-5/23  Rocephin 5/20->  Subjective: Patient seen and evaluated today with no new acute complaints or concerns. No acute concerns or events noted overnight.  He denies any further headache, nausea/vomiting, or diarrhea.  Objective: Vitals:   07/04/18 1753 07/04/18 2057 07/04/18 2122 07/05/18 0543  BP:   Marland Kitchen)  113/59 122/77  Pulse:  84  89 79  Resp:  16 16 16   Temp: 98.9 F (37.2 C)  98.1 F (36.7 C)   TempSrc:   Oral   SpO2:  94% 91% 97%  Weight:    119 kg  Height:        Intake/Output Summary (Last 24 hours) at 07/05/2018 1133 Last data filed at 07/05/2018 1003 Gross per 24 hour  Intake 720 ml  Output 1700 ml  Net -980 ml   Filed Weights   07/03/18 0434 07/04/18 0500 07/05/18 0543  Weight: 118.2 kg 119.2 kg 119 kg    Examination:  General exam: Appears calm and comfortable  Respiratory system: Clear to auscultation. Respiratory effort normal. Cardiovascular system: S1 & S2 heard, RRR. No JVD, murmurs, rubs, gallops or clicks. No pedal edema. Gastrointestinal system: Abdomen is nondistended, soft and nontender. No organomegaly or masses felt. Normal bowel sounds heard. Central nervous system: Alert and oriented. No focal neurological deficits. Extremities: Symmetric 5 x 5 power. Skin: No rashes, lesions or ulcers Psychiatry: Judgement and insight appear normal. Mood & affect appropriate.     Data Reviewed: I have personally reviewed following labs and imaging studies  CBC: Recent Labs  Lab 07/02/18 1356 07/03/18 0627 07/04/18 0515 07/05/18 0623  WBC 7.3 6.2 4.7 4.3  NEUTROABS 6.3  --   --   --   HGB 14.4 12.4* 12.4* 12.3*  HCT 42.6 36.9* 36.2* 36.5*  MCV 88.6 89.8 89.2 88.8  PLT 93* 70* 67* 83*   Basic Metabolic Panel: Recent Labs  Lab 07/02/18 1356 07/03/18 0627 07/04/18 0515 07/05/18 0623  NA 137 136 136 137  K 3.8 3.2* 4.0 3.7  CL 101 104 105 103  CO2 21* 23 23 25   GLUCOSE 223* 191* 148* 168*  BUN 13 14 12 11   CREATININE 0.88 0.75 0.77 0.67  CALCIUM 8.9 7.9* 7.7* 7.9*  MG  --  1.4* 2.0 2.1   GFR: Estimated Creatinine Clearance: 122.7 mL/min (by C-G formula based on SCr of 0.67 mg/dL). Liver Function Tests: Recent Labs  Lab 07/02/18 1356  AST 40  ALT 59*  ALKPHOS 63  BILITOT 1.2  PROT 7.7  ALBUMIN 4.2   No results for input(s): LIPASE, AMYLASE in the last 168 hours.  No results for input(s): AMMONIA in the last 168 hours. Coagulation Profile: Recent Labs  Lab 07/02/18 1356  INR 1.5*   Cardiac Enzymes: No results for input(s): CKTOTAL, CKMB, CKMBINDEX, TROPONINI in the last 168 hours. BNP (last 3 results) No results for input(s): PROBNP in the last 8760 hours. HbA1C: No results for input(s): HGBA1C in the last 72 hours. CBG: Recent Labs  Lab 07/04/18 1128 07/04/18 1609 07/04/18 2125 07/05/18 0727 07/05/18 1104  GLUCAP 197* 141* 192* 149* 214*   Lipid Profile: No results for input(s): CHOL, HDL, LDLCALC, TRIG, CHOLHDL, LDLDIRECT in the last 72 hours. Thyroid Function Tests: No results for input(s): TSH, T4TOTAL, FREET4, T3FREE, THYROIDAB in the last 72 hours. Anemia Panel: Recent Labs    07/02/18 1356  FERRITIN 76   Sepsis Labs: Recent Labs  Lab 07/02/18 1346 07/02/18 1356 07/02/18 1543  PROCALCITON <0.10  --   --   LATICACIDVEN  --  4.3* 3.2*    Recent Results (from the past 240 hour(s))  Culture, blood (Routine x 2)     Status: Abnormal   Collection Time: 07/02/18  1:56 PM  Result Value Ref Range Status   Specimen Description   Final  BLOOD RIGHT ARM Performed at [facility], [address], [place], [place] [address]    Special Requests   Final    BOTTLES DRAWN AEROBIC AND ANAEROBIC Blood Culture adequate volume Performed at [facility], [address] [place], [place] [address]    Culture  Setup Time   Final    IN BOTH AEROBIC AND ANAEROBIC BOTTLES GRAM POSITIVE COCCI IN CHAINS Gram Stain Report Called to,Read Back By and Verified With: K GAVES,RN @0219  07/03/18 [facility] Performed at [facility], [address], [place], [place] [address]    Culture STREPTOCOCCUS GROUP G (A)  Final   Report Status 07/05/2018 FINAL  Final   Organism ID, Bacteria STREPTOCOCCUS GROUP G  Final      Susceptibility   Streptococcus group g - MIC*    CLINDAMYCIN <=0.25 SENSITIVE Sensitive     AMPICILLIN <=0.25 SENSITIVE Sensitive      ERYTHROMYCIN <=0.12 SENSITIVE Sensitive     VANCOMYCIN 0.5 SENSITIVE Sensitive     CEFTRIAXONE <=0.12 SENSITIVE Sensitive     LEVOFLOXACIN 0.5 SENSITIVE Sensitive     * STREPTOCOCCUS GROUP G  Culture, blood (Routine x 2)     Status: Abnormal   Collection Time: 07/02/18  2:01 PM  Result Value Ref Range Status   Specimen Description   Final    BLOOD LEFT ARM Performed at [facility], [address], [place], [place] [address]    Special Requests   Final    BOTTLES DRAWN AEROBIC AND ANAEROBIC Blood Culture adequate volume Performed at [facility], [address], [place], [place] [address]    Culture  Setup Time   Final    IN BOTH AEROBIC AND ANAEROBIC BOTTLES GRAM POSITIVE COCCI IN CHAINS Gram Stain Report Called to,Read Back By and Verified With: K GRAVES,RN @0219  07/03/18 [facility] Performed at [facility], [address], [place], [place] [address]    Culture (A)  Final    STREPTOCOCCUS GROUP G SUSCEPTIBILITIES PERFORMED ON PREVIOUS CULTURE WITHIN THE LAST 5 DAYS. Performed at [facility], [address] [place], [place] [address]    Report Status 07/05/2018 FINAL  Final  SARS Coronavirus 2 (CEPHEID- Performed in [facility] hospital lab), Hosp Order     Status: None   Collection Time: 07/02/18  2:20 PM  Result Value Ref Range Status   SARS Coronavirus 2 NEGATIVE NEGATIVE Final    Comment: (NOTE) If result is NEGATIVE SARS-CoV-2 target nucleic acids are NOT DETECTED. The SARS-CoV-2 RNA is generally detectable in upper and lower  respiratory specimens during the acute phase of infection. The lowest  concentration of SARS-CoV-2 viral copies this assay can detect is 250  copies / mL. A negative result does not preclude SARS-CoV-2 infection  and should not be used as the sole basis for treatment or other  patient management decisions.  A negative result may occur with  improper specimen collection / handling, submission of specimen other  than nasopharyngeal swab,  presence of viral mutation(s) within the  areas targeted by this assay, and inadequate number of viral copies  (<250 copies / mL). A negative result must be combined with clinical  observations, patient history, and epidemiological information. If result is POSITIVE SARS-CoV-2 target nucleic acids are DETECTED. The SARS-CoV-2 RNA is generally detectable in upper and lower  respiratory specimens dur ing the acute phase of infection.  Positive  results are indicative of active infection with SARS-CoV-2.  Clinical  correlation with patient history and other diagnostic information  is  necessary to determine patient infection status.  Positive results do  not rule out bacterial infection or co-infection with other viruses. If result is PRESUMPTIVE POSTIVE SARS-CoV-2 nucleic acids MAY BE PRESENT.   A presumptive positive result was obtained on the submitted specimen  and confirmed on repeat testing.  While 2019 novel coronavirus  (SARS-CoV-2) nucleic acids may be present in the submitted sample  additional confirmatory testing may be necessary for epidemiological  and / or clinical management purposes  to differentiate between  SARS-CoV-2 and other Sarbecovirus currently known to infect humans.  If clinically indicated additional testing with an alternate test  methodology [phone]) is advised. The SARS-CoV-2 RNA is generally  detectable in upper and lower respiratory sp ecimens during the acute  phase of infection. The expected result is Negative. Fact Sheet for Patients:  BoilerBrush.com.cy Fact Sheet for Healthcare Providers: https://pope.com/ This test is not yet approved or cleared by the [place] FDA and has been authorized for detection and/or diagnosis of SARS-CoV-2 by FDA under an Emergency Use Authorization (EUA).  This EUA will remain in effect (meaning this test can be used) for the duration of the COVID-19 declaration under  Section 564(b)(1) of the Act, 21 U.S.C. section 360bbb-3(b)(1), unless the authorization is terminated or revoked sooner. Performed at [facility], [address] [place], [place] [address]   Respiratory Panel by PCR     Status: None   Collection Time: 07/02/18  4:34 PM  Result Value Ref Range Status   Adenovirus NOT DETECTED NOT DETECTED Final   Coronavirus 229E NOT DETECTED NOT DETECTED Final    Comment: (NOTE) The Coronavirus on the Respiratory Panel, DOES NOT test for the novel  Coronavirus (2019 nCoV)    Coronavirus HKU1 NOT DETECTED NOT DETECTED Final   Coronavirus NL63 NOT DETECTED NOT DETECTED Final   Coronavirus OC43 NOT DETECTED NOT DETECTED Final   Metapneumovirus NOT DETECTED NOT DETECTED Final   Rhinovirus / Enterovirus NOT DETECTED NOT DETECTED Final   Influenza A NOT DETECTED NOT DETECTED Final   Influenza B NOT DETECTED NOT DETECTED Final   Parainfluenza Virus 1 NOT DETECTED NOT DETECTED Final   Parainfluenza Virus 2 NOT DETECTED NOT DETECTED Final   Parainfluenza Virus 3 NOT DETECTED NOT DETECTED Final   Parainfluenza Virus 4 NOT DETECTED NOT DETECTED Final   Respiratory Syncytial Virus NOT DETECTED NOT DETECTED Final   Bordetella pertussis NOT DETECTED NOT DETECTED Final   Chlamydophila pneumoniae NOT DETECTED NOT DETECTED Final   Mycoplasma pneumoniae NOT DETECTED NOT DETECTED Final    Comment: Performed at [facility], [address] [place], [place] [address]  MRSA PCR Screening     Status: None   Collection Time: 07/03/18  8:12 AM  Result Value Ref Range Status   MRSA by PCR NEGATIVE NEGATIVE Final    Comment:        The GeneXpert MRSA Assay (FDA approved for NASAL specimens only), is one component of a comprehensive MRSA colonization surveillance program. It is not intended to diagnose MRSA infection nor to guide or monitor treatment for MRSA infections. Performed at [facility], [address], [place], [place] [address]   Culture, blood  (routine x 2)     Status: None (Preliminary result)   Collection Time: 07/04/18 10:10 AM  Result Value Ref Range Status   Specimen Description BLOOD LEFT HAND  Final   Special Requests   Final    BOTTLES DRAWN AEROBIC AND ANAEROBIC Blood Culture  adequate volume Performed at [facility], [address] [place], [place] [address]    Culture PENDING  Incomplete   Report Status PENDING  Incomplete  Culture, blood (routine x 2)     Status: None (Preliminary result)   Collection Time: 07/04/18 10:20 AM  Result Value Ref Range Status   Specimen Description BLOOD RIGHT HAND  Final   Special Requests   Final    BOTTLES DRAWN AEROBIC AND ANAEROBIC Blood Culture adequate volume Performed at [facility], [address], [place], [place] [address]    Culture PENDING  Incomplete   Report Status PENDING  Incomplete         Radiology Studies: No results found.      Scheduled Meds: . amLODipine  5 mg Oral Daily  . enoxaparin (LOVENOX) injection  60 mg Subcutaneous Q24H  . insulin aspart  0-15 Units Subcutaneous TID WC  . insulin aspart  0-5 Units Subcutaneous QHS  . losartan  100 mg Oral Daily  . metoprolol succinate  25 mg Oral Daily   Continuous Infusions: . azithromycin 500 mg (07/04/18 1723)  . cefTRIAXone (ROCEPHIN)  IV       LOS: 2 days    Time spent: 30 minutes    Lamone Ferrelli Hoover Brunette, DO [facility] Pager [phone]  If 7PM-7AM, please contact night-coverage [email or web address] Password [email or web address] 07/05/2018, 11:33 AM

## 2018-07-05 NOTE — Progress Notes (Signed)
      INFECTIOUS DISEASE ATTENDING ADDENDUM:   Date: 07/05/2018  Patient name: Joel Reed.  Medical record number: [id number]  Date of birth: [birth year]   Diagnosis: Group G streptococcal bacteremia rule out endocarditis  Culture Result: Group G strep  Allergies  Allergen Reactions  . Gluten Meal     OPAT Orders Discharge antibiotics: Ceftriaxone 2 grams daily  End Date: July 2nd, 2020  [facility] Care Per Protocol:    Labs  weekly while on IV antibiotics: _x_ CBC with differential _x_ BMP w GFR   _x_ Please pull PIC at completion of IV antibiotics __ Please leave PIC in place until doctor has seen patient or been notified  Fax weekly labs to [phone]  Clinic Follow Up Appt:  I will arrange New patient visit with [place] in  Early June.   Alcide Evener 07/05/2018, 3:09 PM

## 2018-07-05 NOTE — Progress Notes (Signed)
.     Date of Admission:  07/02/2018          Reason for Consult: Streptococcal bacteremia    Referring Provider: Dr. Manuella Ghazi   Assessment:  1. Group G streptococcal bacteremia without clear cause 2. NASH with cirrhosis 3. DM2  Plan:  1. Treat empirically for 6 weeks with IV ceftiraxone 2. I will establish followup in our clinic for him  Principal Problem:   Fever Active Problems:   Type II diabetes mellitus, uncontrolled (HCC)   HTN (hypertension), benign   HLD (hyperlipidemia)   PVD (peripheral vascular disease) (HCC)   Gastritis and duodenitis   Celiac disease   Fatigue   NAFLD (nonalcoholic fatty liver disease)   Cirrhosis, non-alcoholic (HCC)   Bacteremia due to Gram-positive bacteria   Scheduled Meds:  amLODipine  5 mg Oral Daily   enoxaparin (LOVENOX) injection  60 mg Subcutaneous Q24H   insulin aspart  0-15 Units Subcutaneous TID WC   insulin aspart  0-5 Units Subcutaneous QHS   losartan  100 mg Oral Daily   metoprolol succinate  25 mg Oral Daily   Continuous Infusions:  azithromycin 500 mg (07/04/18 1723)   cefTRIAXone (ROCEPHIN)  IV     PRN Meds:.acetaminophen **OR** acetaminophen, bisacodyl, HYDROcodone-acetaminophen, ipratropium-albuterol, ondansetron (ZOFRAN) IV, polyethylene glycol, senna-docusate, sodium phosphate  HPI: Carry Ortez. is a 68 y.o. male malewith medical history significant ofnonalcoholic liver cirrhosis, celiac disease, diabetes mellitus type 2, hypertension came to the hospital with complaints of fever and fatigue. Patient states he was feeling well up until yesterday evening but this morning when he woke up he started having subjective fevers and chills with feeling of nausea, IN ER fevrile to over 102.3. CoviD negative. CXR with ? Infiltrate. He has since grown group G streptococci. There is no clear source from talking with the primary team. TTE negative  While this could ber just a transietn bacteremia in patient with  cirrhosis I would go ahead and rx with 6 weeks of IV ceftriaxone.    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   Sleep apnea    Tubular adenoma of colon     Social History   Tobacco Use   Smoking status: Never Smoker   Smokeless tobacco: Never Used  Substance Use Topics   Alcohol use: No   Drug use: No    Family History  Problem Relation Age of Onset   Esophageal cancer Father    Breast cancer Sister        bone cancer   Heart attack Brother 68   Cancer - Other Sister        cervical?   Colon cancer Neg Hx    Rectal cancer Neg Hx    Stomach cancer Neg Hx    Allergies  Allergen Reactions   Gluten Meal     OBJECTIVE: Blood pressure 126/62, pulse 79, temperature 98.5 F (36.9 C), temperature source Oral, resp. rate 16, height 6' (1.829  m), weight 119 kg, SpO2 96 %.  Physical Exam  Patient consult was conducted remotely Lab Results Lab Results  Component Value Date   WBC 4.3 07/05/2018   HGB 12.3 (L) 07/05/2018   HCT 36.5 (L) 07/05/2018   MCV 88.8 07/05/2018   PLT 83 (L) 07/05/2018    Lab Results  Component Value Date   CREATININE 0.67 07/05/2018   BUN 11 07/05/2018   NA 137 07/05/2018   K 3.7 07/05/2018   CL 103 07/05/2018   CO2 25 07/05/2018    Lab Results  Component Value Date   ALT 59 (H) 07/02/2018   AST 40 07/02/2018   ALKPHOS 63 07/02/2018   BILITOT 1.2 07/02/2018     Microbiology: Recent Results (from the past 240 hour(s))  Culture, blood (Routine x 2)     Status: Abnormal   Collection Time:  07/02/18  1:56 PM  Result Value Ref Range Status   Specimen Description   Final    BLOOD RIGHT ARM Performed at [facility], [address] [place], [place] [address]    Special Requests   Final    BOTTLES DRAWN AEROBIC AND ANAEROBIC Blood Culture adequate volume Performed at [facility], [address], [place], [place] [address]    Culture  Setup Time   Final    IN BOTH AEROBIC AND ANAEROBIC BOTTLES GRAM POSITIVE COCCI IN CHAINS Gram Stain Report Called to,Read Back By and Verified With: K GAVES,RN @0219  07/03/18 [facility] Performed at [facility], [address], [place], [place] [address]    Culture STREPTOCOCCUS GROUP G (A)  Final   Report Status 07/05/2018 FINAL  Final   Organism ID, Bacteria STREPTOCOCCUS GROUP G  Final      Susceptibility   Streptococcus group g - MIC*    CLINDAMYCIN <=0.25 SENSITIVE Sensitive     AMPICILLIN <=0.25 SENSITIVE Sensitive     ERYTHROMYCIN <=0.12 SENSITIVE Sensitive     VANCOMYCIN 0.5 SENSITIVE Sensitive     CEFTRIAXONE <=0.12 SENSITIVE Sensitive     LEVOFLOXACIN 0.5 SENSITIVE Sensitive     * STREPTOCOCCUS GROUP G  Culture, blood (Routine x 2)     Status: Abnormal   Collection Time: 07/02/18  2:01 PM  Result Value Ref Range Status   Specimen Description   Final    BLOOD LEFT ARM Performed at [facility], [address], [place], [place] [address]    Special Requests   Final    BOTTLES DRAWN AEROBIC AND ANAEROBIC Blood Culture adequate volume Performed at [facility], [address] [place], [place] [address]    Culture  Setup Time   Final    IN BOTH AEROBIC AND ANAEROBIC BOTTLES GRAM POSITIVE COCCI IN CHAINS Gram Stain Report Called to,Read Back By and Verified With: K GRAVES,RN @0219  07/03/18 [facility] Performed at [facility], [address], [place], [place] [address]    Culture (A)  Final    STREPTOCOCCUS GROUP G SUSCEPTIBILITIES PERFORMED ON PREVIOUS CULTURE WITHIN THE LAST 5 DAYS. Performed at [facility], [place]  [address], [place], [place] [address]    Report Status 07/05/2018 FINAL  Final  SARS Coronavirus 2 (CEPHEID- Performed in [place] hospital lab), Hosp Order     Status: None   Collection Time: 07/02/18  2:20 PM  Result Value Ref Range Status   SARS Coronavirus 2 NEGATIVE NEGATIVE Final    Comment: (NOTE) If result is NEGATIVE SARS-CoV-2 target nucleic acids are NOT DETECTED. The SARS-CoV-2 RNA is generally  detectable in upper and lower  respiratory specimens during the acute phase of infection. The lowest  concentration of SARS-CoV-2 viral copies this assay can detect is 250  copies / mL. A negative result does not preclude SARS-CoV-2 infection  and should not be used as the sole basis for treatment or other  patient management decisions.  A negative result may occur with  improper specimen collection / handling, submission of specimen other  than nasopharyngeal swab, presence of viral mutation(s) within the  areas targeted by this assay, and inadequate number of viral copies  (<250 copies / mL). A negative result must be combined with clinical  observations, patient history, and epidemiological information. If result is POSITIVE SARS-CoV-2 target nucleic acids are DETECTED. The SARS-CoV-2 RNA is generally detectable in upper and lower  respiratory specimens dur ing the acute phase of infection.  Positive  results are indicative of active infection with SARS-CoV-2.  Clinical  correlation with patient history and other diagnostic information is  necessary to determine patient infection status.  Positive results do  not rule out bacterial infection or co-infection with other viruses. If result is PRESUMPTIVE POSTIVE SARS-CoV-2 nucleic acids MAY BE PRESENT.   A presumptive positive result was obtained on the submitted specimen  and confirmed on repeat testing.  While 2019 novel coronavirus  (SARS-CoV-2) nucleic acids may be present in the submitted sample  additional confirmatory testing  may be necessary for epidemiological  and / or clinical management purposes  to differentiate between  SARS-CoV-2 and other Sarbecovirus currently known to infect humans.  If clinically indicated additional testing with an alternate test  methodology [phone]) is advised. The SARS-CoV-2 RNA is generally  detectable in upper and lower respiratory sp ecimens during the acute  phase of infection. The expected result is Negative. Fact Sheet for Patients:  StrictlyIdeas.no Fact Sheet for Healthcare Providers: BankingDealers.co.za This test is not yet approved or cleared by the [place] FDA and has been authorized for detection and/or diagnosis of SARS-CoV-2 by FDA under an Emergency Use Authorization (EUA).  This EUA will remain in effect (meaning this test can be used) for the duration of the COVID-19 declaration under Section 564(b)(1) of the Act, 21 U.S.C. section 360bbb-3(b)(1), unless the authorization is terminated or revoked sooner. Performed at [facility], [address], [place], [place] [address]   Respiratory Panel by PCR     Status: None   Collection Time: 07/02/18  4:34 PM  Result Value Ref Range Status   Adenovirus NOT DETECTED NOT DETECTED Final   Coronavirus 229E NOT DETECTED NOT DETECTED Final    Comment: (NOTE) The Coronavirus on the Respiratory Panel, DOES NOT test for the novel  Coronavirus (2019 nCoV)    Coronavirus HKU1 NOT DETECTED NOT DETECTED Final   Coronavirus NL63 NOT DETECTED NOT DETECTED Final   Coronavirus OC43 NOT DETECTED NOT DETECTED Final   Metapneumovirus NOT DETECTED NOT DETECTED Final   Rhinovirus / Enterovirus NOT DETECTED NOT DETECTED Final   Influenza A NOT DETECTED NOT DETECTED Final   Influenza B NOT DETECTED NOT DETECTED Final   Parainfluenza Virus 1 NOT DETECTED NOT DETECTED Final   Parainfluenza Virus 2 NOT DETECTED NOT DETECTED Final   Parainfluenza Virus 3 NOT DETECTED NOT DETECTED  Final   Parainfluenza Virus 4 NOT DETECTED NOT DETECTED Final   Respiratory Syncytial Virus NOT DETECTED NOT DETECTED Final   Bordetella pertussis NOT DETECTED NOT DETECTED Final   Chlamydophila pneumoniae NOT DETECTED NOT DETECTED Final  Mycoplasma pneumoniae NOT DETECTED NOT DETECTED Final    Comment: Performed at [facility], [place] [address], [place], [place] [address]  MRSA PCR Screening     Status: None   Collection Time: 07/03/18  8:12 AM  Result Value Ref Range Status   MRSA by PCR NEGATIVE NEGATIVE Final    Comment:        The GeneXpert MRSA Assay (FDA approved for NASAL specimens only), is one component of a comprehensive MRSA colonization surveillance program. It is not intended to diagnose MRSA infection nor to guide or monitor treatment for MRSA infections. Performed at [facility], [address], [place], [place] [address]   Culture, blood (routine x 2)     Status: None (Preliminary result)   Collection Time: 07/04/18 10:10 AM  Result Value Ref Range Status   Specimen Description BLOOD LEFT HAND  Final   Special Requests   Final    BOTTLES DRAWN AEROBIC AND ANAEROBIC Blood Culture adequate volume   Culture   Final    NO GROWTH 1 DAY Performed at [facility], [address] [place], [place] [address]    Report Status PENDING  Incomplete  Culture, blood (routine x 2)     Status: None (Preliminary result)   Collection Time: 07/04/18 10:20 AM  Result Value Ref Range Status   Specimen Description BLOOD RIGHT HAND  Final   Special Requests   Final    BOTTLES DRAWN AEROBIC AND ANAEROBIC Blood Culture adequate volume   Culture   Final    NO GROWTH 1 DAY Performed at [facility], [address], [place], [place] [address]    Report Status PENDING  Incomplete    Alcide Evener, MD [facility] [phone] pager  07/05/2018, 3:03 PM

## 2018-07-05 NOTE — Progress Notes (Signed)
PHARMACY CONSULT NOTE FOR:  OUTPATIENT  PARENTERAL ANTIBIOTIC THERAPY (OPAT)  Indication: Streptococcal bacteremia           Regimen: ceftriaxone 2g IV q24h  End date: August 14, 2018  IV antibiotic discharge orders are pended. To discharging provider:  please sign these orders via discharge navigator,  Select New Orders & click on the button choice - Manage This Unsigned Work.     Thank you for allowing pharmacy to be a part of this patient's care.  Despina Pole, Pharm. D. Clinical Pharmacist 07/05/2018 4:56 PM

## 2018-07-06 ENCOUNTER — Inpatient Hospital Stay (HOSPITAL_COMMUNITY): Payer: Medicare Other

## 2018-07-06 LAB — GLUCOSE, CAPILLARY
Glucose-Capillary: 153 mg/dL — ABNORMAL HIGH (ref 70–99)
Glucose-Capillary: 119 mg/dL — ABNORMAL HIGH (ref 70–99)
Glucose-Capillary: 125 mg/dL — ABNORMAL HIGH (ref 70–99)
Glucose-Capillary: 121 mg/dL — ABNORMAL HIGH (ref 70–99)

## 2018-07-06 IMAGING — US RIGHT LOWER EXTREMITY VENOUS ULTRASOUND
1 series · 13 of 24 positions shown · non-contrast
Comparison: None.

CLINICAL DATA: 67-year-old male with right lower leg pain and
swelling



[Series 1: right lower extremity venous ultrasound · 13 of 58 slices shown]
[im 1/58]
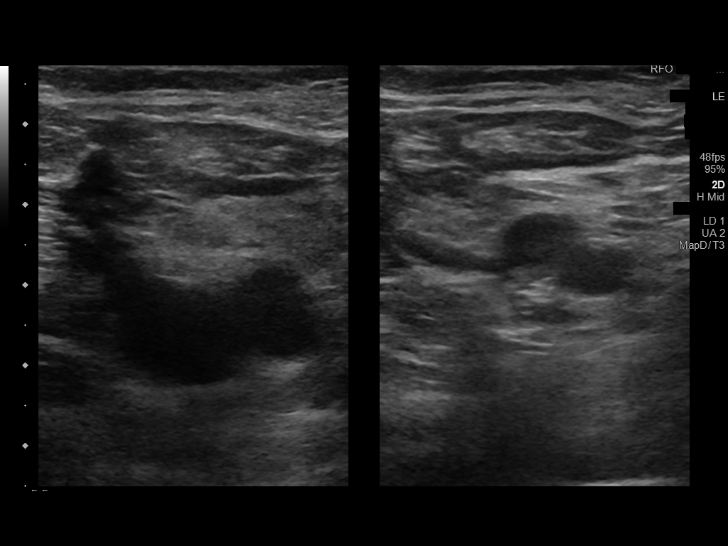
[im 5/58]
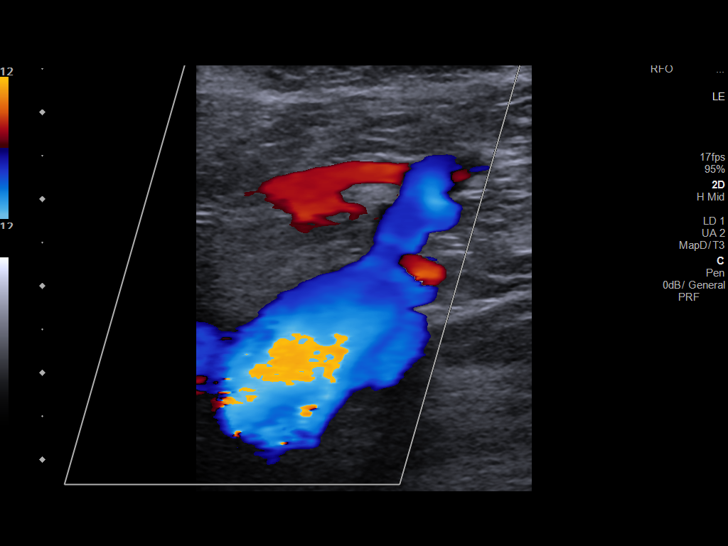
[im 10/58]
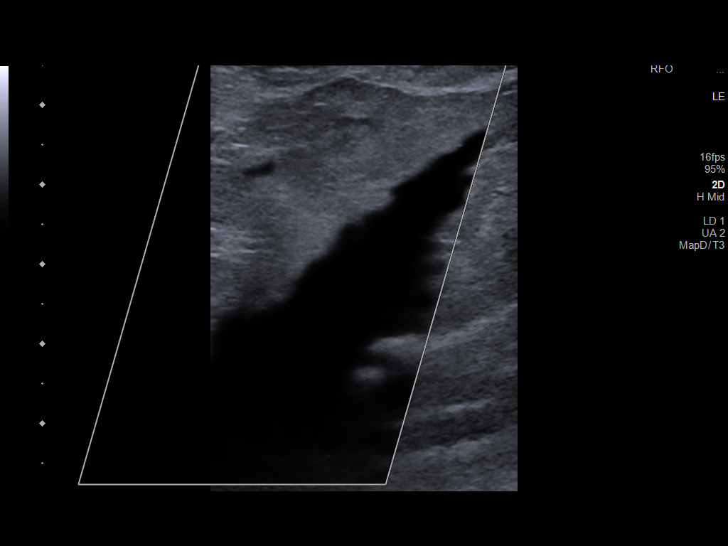
[im 15/58]
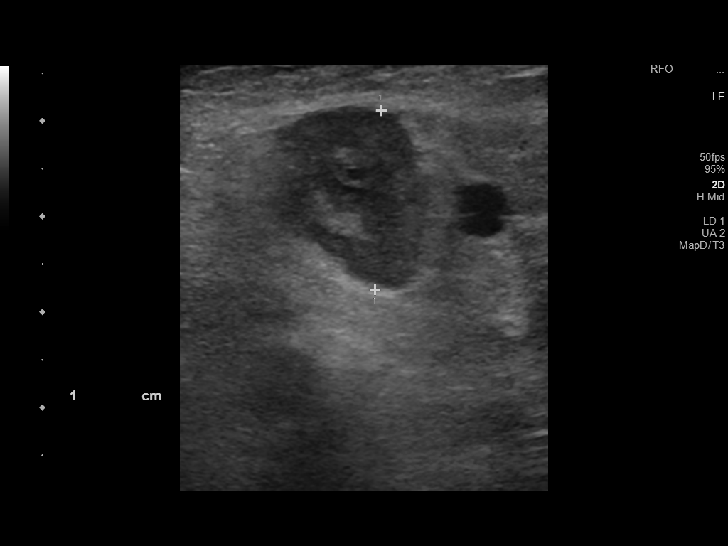
[im 20/58]
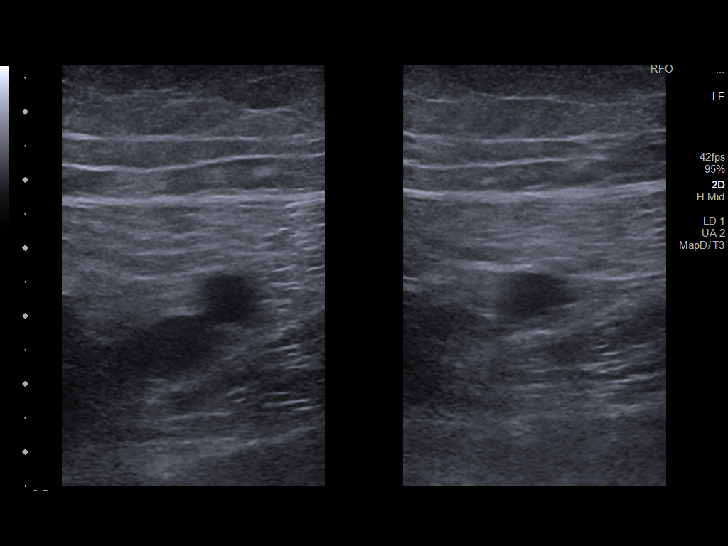
[im 25/58]
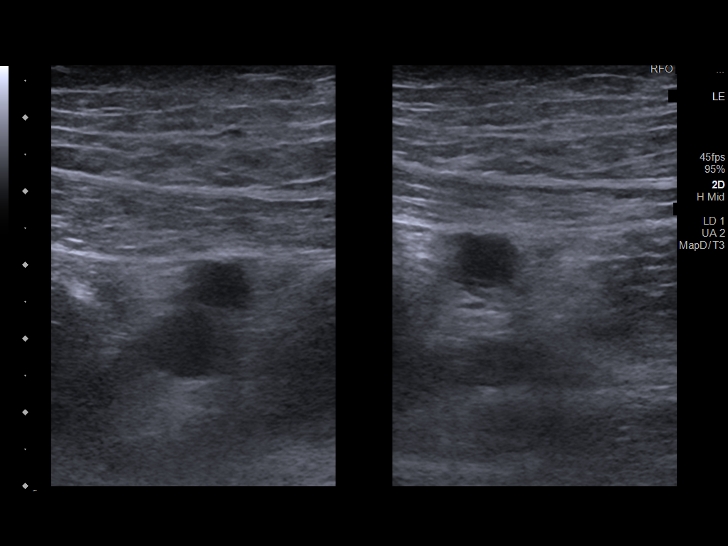
[im 30/58]
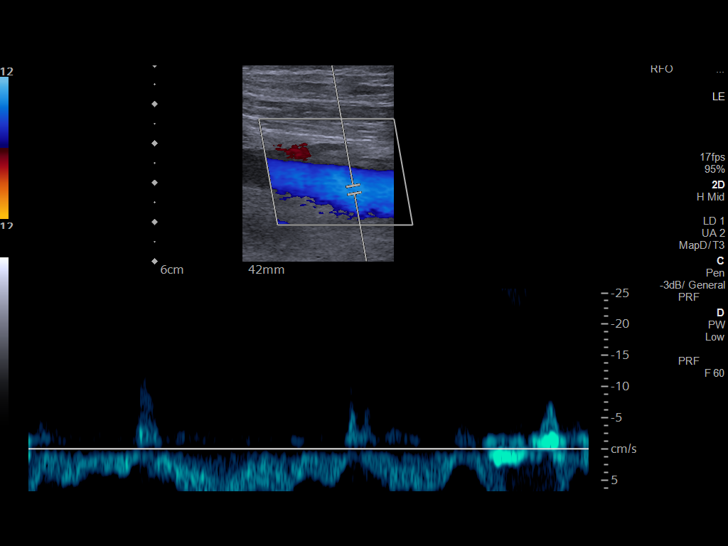
[im 33/58]
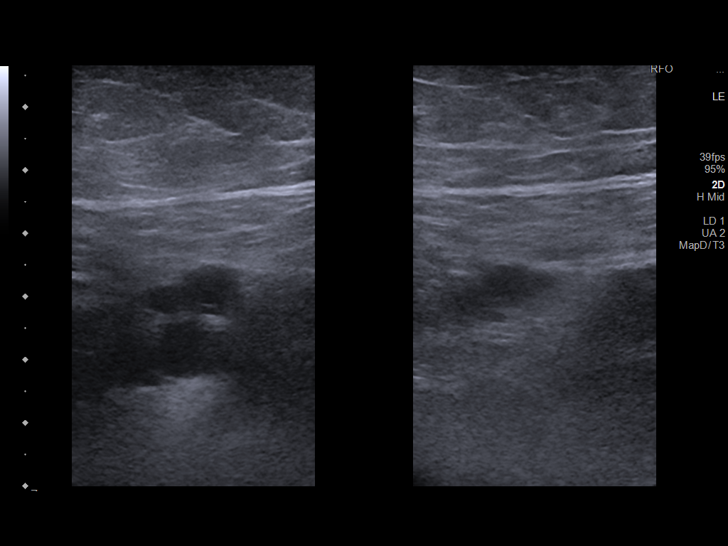
[im 38/58]
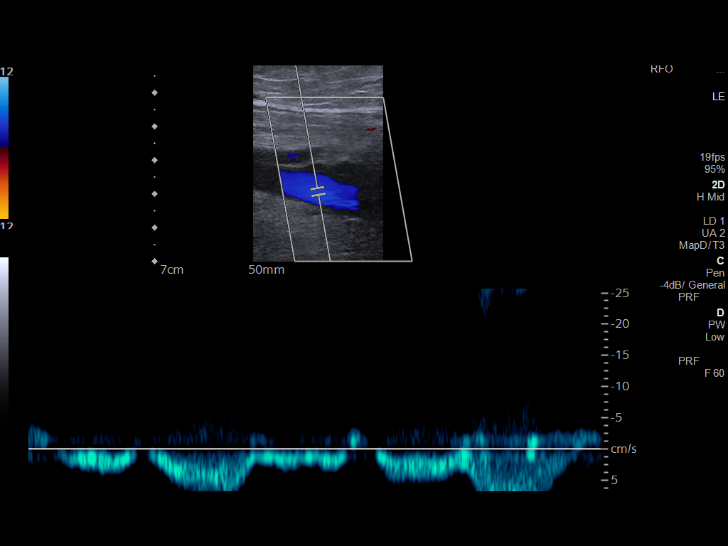
[im 43/58]
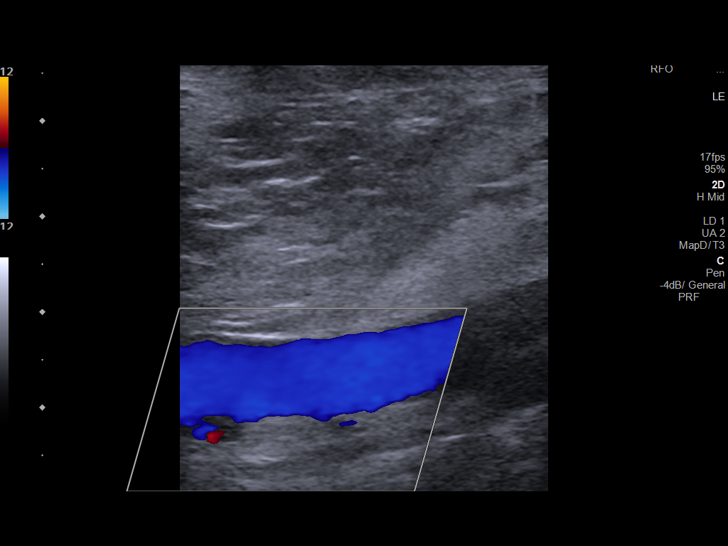
[im 48/58]
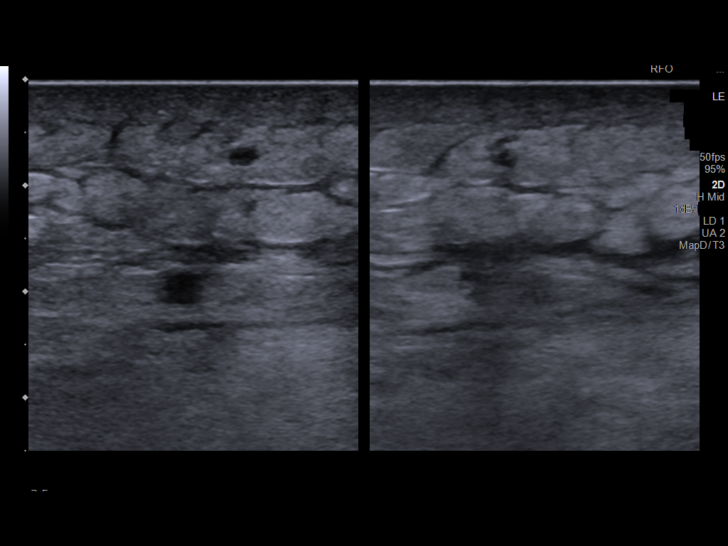
[im 53/58]
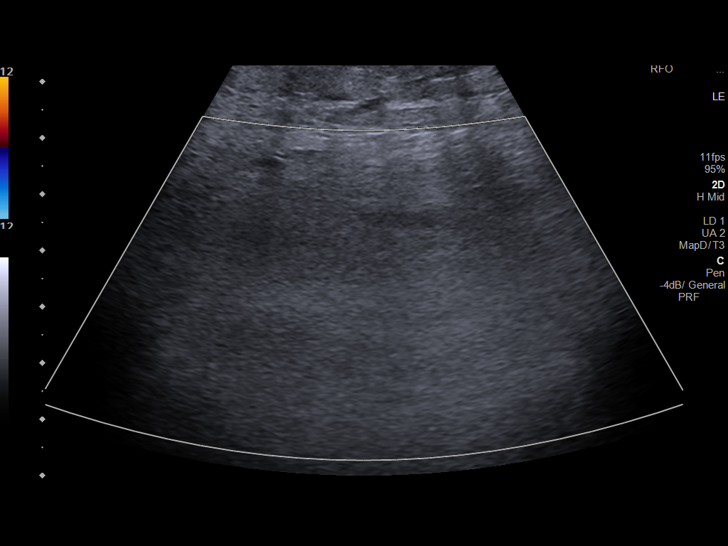
[im 58/58]
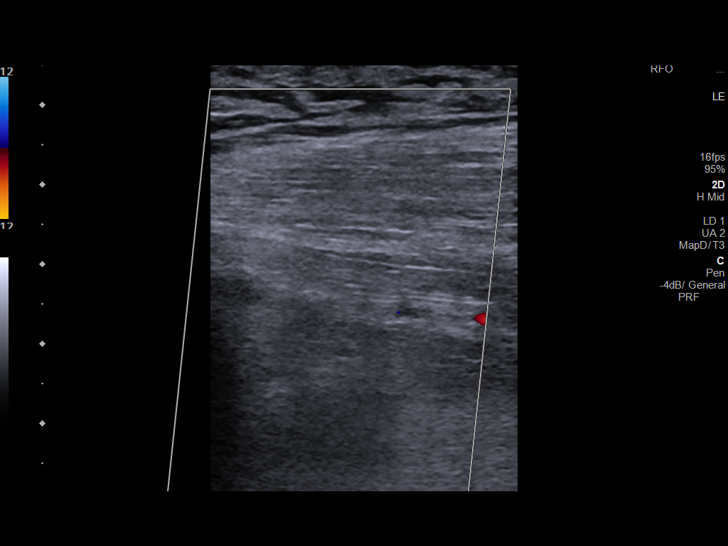

[13 of 24 positions shown; findings below may reference images not displayed]

FINDINGS: Contralateral Common Femoral Vein: Respiratory phasicity is normal
and symmetric with the symptomatic side. No evidence of thrombus.
Normal compressibility.

Common Femoral Vein: No evidence of thrombus. Normal
compressibility, respiratory phasicity and response to augmentation.

Saphenofemoral Junction: No evidence of thrombus. Normal
compressibility and flow on color Doppler imaging.

Profunda Femoral Vein: No evidence of thrombus. Normal
compressibility and flow on color Doppler imaging.

Femoral Vein: No evidence of thrombus. Normal compressibility,
respiratory phasicity and response to augmentation.

Popliteal Vein: No evidence of thrombus. Normal compressibility,
respiratory phasicity and response to augmentation.

Calf Veins: Peroneal vein without thrombus. Posterior tibial vein
not well visualized.

Superficial Great Saphenous Vein: No evidence of thrombus. Normal
compressibility and flow on color Doppler imaging.

Other Findings: Lymph node in the right inguinal region enlarged in
the short axis dimension.

Edema
IMPRESSION: Sonographic survey of the right lower extremity negative for DVT

Edema

Enlarged right inguinal lymph node. This may be reactive given the
presence of edema. Correlation with presentation and lab values may
be useful if there is concern for lymphoproliferative disorder.

## 2018-07-06 MED ORDER — PANTOPRAZOLE SODIUM 40 MG PO TBEC
40.0000 mg | DELAYED_RELEASE_TABLET | Freq: Every day | ORAL | Status: DC
  Administered 2018-07-06 – 2018-07-07 (×2): 40 mg via ORAL
  Filled 2018-07-06 (×2): qty 1

## 2018-07-06 MED ORDER — VITAMIN B-12 1000 MCG PO TABS
500.0000 ug | ORAL_TABLET | Freq: Every day | ORAL | Status: DC
  Administered 2018-07-06 – 2018-07-07 (×2): 500 ug via ORAL
  Filled 2018-07-06 (×2): qty 1

## 2018-07-06 MED ORDER — VITAMIN D 25 MCG (1000 UNIT) PO TABS
1000.0000 [IU] | ORAL_TABLET | Freq: Every day | ORAL | Status: DC
  Administered 2018-07-06 – 2018-07-07 (×2): 1000 [IU] via ORAL
  Filled 2018-07-06 (×2): qty 1

## 2018-07-06 MED ORDER — FLUTICASONE PROPIONATE 50 MCG/ACT NA SUSP
2.0000 | Freq: Every day | NASAL | Status: DC
  Administered 2018-07-06: 2 via NASAL
  Filled 2018-07-06: qty 16

## 2018-07-06 MED ORDER — RIVAROXABAN 20 MG PO TABS
20.0000 mg | ORAL_TABLET | Freq: Every day | ORAL | Status: DC
  Administered 2018-07-06 – 2018-07-07 (×2): 20 mg via ORAL
  Filled 2018-07-06 (×2): qty 1

## 2018-07-06 NOTE — Progress Notes (Signed)
Spoke to primary RN regarding PICC placement and it will be place tomorrow .

## 2018-07-06 NOTE — Progress Notes (Signed)
PROGRESS NOTE    Joel Reed.  GUY:[id number] DOB: November 27, 1950 DOA: 07/02/2018 PCP: Donita Brooks, MD   Brief Narrative:  Per HPI: Joel Reedis a 68 y.o.malewith medical history significant ofnonalcoholic liver cirrhosis, celiac disease, diabetes mellitus type 2, hypertension came to the hospital with complaints of fever and fatigue. Patient states he was feeling well up until yesterday evening but this morning when he woke up he started having subjective fevers and chills with feeling of nausea. Denies any abdominal pain, shortness of breath, cough or any other symptoms. Denies any sick contacts. In the ER he was noted to have temperature of 101.1 which went up to 102.3. COVID-negative. Saturating greater than 90% on room air. Chest x-ray showed questionable infiltrate at the bases. Normal WBC. While in the ER he had one episode of diarrhea but no more since. It was nonbloody. He was started on IV Rocephin and azithromycin admitted to the hospital.  Patient was admitted with fever, initially with unknown etiology, but it now appears that he has gram-positive bacteremia. Hedenies any further symptomatology and has been without fever in the last 48 hours.  Consulted with cardiology on 5/22 who stated that TEE currently appears unnecessary.  Unfortunately, no source of infection is otherwise noted.  Discussed with ID Dr. Algis Liming earlier this morning on 5/23 and increased dose of Rocephin for bacteremia coverage.  Repeat blood cultures are still pending.  Patient will likely require 6 weeks of antibiotic treatment on Rocephin once PICC line is placed and blood cultures are noted to be clearing.   Assessment & Plan:   Principal Problem:   Fever Active Problems:   Type II diabetes mellitus, uncontrolled (HCC)   HTN (hypertension), benign   HLD (hyperlipidemia)   PVD (peripheral vascular disease) (HCC)   Gastritis and duodenitis   Celiac disease   Fatigue   NAFLD  (nonalcoholic fatty liver disease)   Cirrhosis, non-alcoholic (HCC)   Bacteremia due to Gram-positive bacteria  Feversecondary to strep group G bacteremia with undetermined source -Continue Rocephin IV with dose increased to 2 g daily as discussed with ID -Repeat blood cultures currently pending to document clearance -Anticipate PICC line placement and 6 weeks of antibiotic therapy on discharge -Appreciate ID recommendations -Cardiology consulted on 5/22 and did not feel TEE was currently needed unless persistent fevers or repeat growth on cultures noted.  Nonbloody diarrhea, one episode -No further episodes at this time  Hypomagnesemia/hypokalemia -Repleted  Diabetes mellitus type 2 with peripheral neuropathy -Hold home medication, insulin sliding scale Accu-Chek; -Maintain current dosing given unpredictable oral intake with some nausea and vomiting this morning  Essential hypertension -Resume home medications and monitor closely  History of nonalcoholic fatty liver disease with cirrhosis -Follows outpatient gastroenterology. Endoscopy in 2019 showed gastritis and duodenitis.  Right lower extremity edema -Likely related to prior DVT, but ultrasound with no evidence of acute DVT currently noted. -Possible cellulitis, continue current medications -Elevate leg and use compression stockings  DVT prophylaxis:Restart home Xarelto Code Status:Full Family Communication:Patient will call family Disposition Plan:Continue current IV antibiotics as noted below.    Repeat blood cultures returned negative.  PICC line to be placed on 5/25.  Will anticipate discharge thereafter with home IV antibiotics for 6 weeks and follow-up with ID.   Consultants:  Cardiology  ID on file  Procedures:  None  Antimicrobials:  Cefepime 5/20-5/20  Azithromycin 5/20-5/23  Rocephin 5/20->  Subjective: Patient seen and evaluated today with no new acute complaints or  concerns. No acute concerns or events noted overnight.  He is noticing some worsening swelling to his right leg that is not currently painful.  Objective: Vitals:   07/05/18 1323 07/05/18 2100 07/05/18 2138 07/06/18 0555  BP: 126/62  (!) 143/81 136/77  Pulse: 79 76 79 69  Resp: 16 16 16 18   Temp: 98.5 F (36.9 C)  98.6 F (37 C) 98.3 F (36.8 C)  TempSrc: Oral  Oral Oral  SpO2: 96% 94% 95% 98%  Weight:    119.8 kg  Height:        Intake/Output Summary (Last 24 hours) at 07/06/2018 1259 Last data filed at 07/06/2018 1128 Gross per 24 hour  Intake 1570 ml  Output 600 ml  Net 970 ml   Filed Weights   07/04/18 0500 07/05/18 0543 07/06/18 0555  Weight: 119.2 kg 119 kg 119.8 kg    Examination:  General exam: Appears calm and comfortable  Respiratory system: Clear to auscultation. Respiratory effort normal. Cardiovascular system: S1 & S2 heard, RRR. No JVD, murmurs, rubs, gallops or clicks. No pedal edema. Gastrointestinal system: Abdomen is nondistended, soft and nontender. No organomegaly or masses felt. Normal bowel sounds heard. Central nervous system: Alert and oriented. No focal neurological deficits. Extremities: Symmetric 5 x 5 power.  Right lower extremity with edema noted. Skin: No rashes, lesions or ulcers Psychiatry: Judgement and insight appear normal. Mood & affect appropriate.     Data Reviewed: I have personally reviewed following labs and imaging studies  CBC: Recent Labs  Lab 07/02/18 1356 07/03/18 0627 07/04/18 0515 07/05/18 0623  WBC 7.3 6.2 4.7 4.3  NEUTROABS 6.3  --   --   --   HGB 14.4 12.4* 12.4* 12.3*  HCT 42.6 36.9* 36.2* 36.5*  MCV 88.6 89.8 89.2 88.8  PLT 93* 70* 67* 83*   Basic Metabolic Panel: Recent Labs  Lab 07/02/18 1356 07/03/18 0627 07/04/18 0515 07/05/18 0623  NA 137 136 136 137  K 3.8 3.2* 4.0 3.7  CL 101 104 105 103  CO2 21* 23 23 25   GLUCOSE 223* 191* 148* 168*  BUN 13 14 12 11   CREATININE 0.88 0.75 0.77 0.67   CALCIUM 8.9 7.9* 7.7* 7.9*  MG  --  1.4* 2.0 2.1   GFR: Estimated Creatinine Clearance: 123 mL/min (by C-G formula based on SCr of 0.67 mg/dL). Liver Function Tests: Recent Labs  Lab 07/02/18 1356  AST 40  ALT 59*  ALKPHOS 63  BILITOT 1.2  PROT 7.7  ALBUMIN 4.2   No results for input(s): LIPASE, AMYLASE in the last 168 hours. No results for input(s): AMMONIA in the last 168 hours. Coagulation Profile: Recent Labs  Lab 07/02/18 1356  INR 1.5*   Cardiac Enzymes: No results for input(s): CKTOTAL, CKMB, CKMBINDEX, TROPONINI in the last 168 hours. BNP (last 3 results) No results for input(s): PROBNP in the last 8760 hours. HbA1C: No results for input(s): HGBA1C in the last 72 hours. CBG: Recent Labs  Lab 07/05/18 1104 07/05/18 1611 07/05/18 2141 07/06/18 0721 07/06/18 1107  GLUCAP 214* 133* 135* 153* 119*   Lipid Profile: No results for input(s): CHOL, HDL, LDLCALC, TRIG, CHOLHDL, LDLDIRECT in the last 72 hours. Thyroid Function Tests: No results for input(s): TSH, T4TOTAL, FREET4, T3FREE, THYROIDAB in the last 72 hours. Anemia Panel: No results for input(s): VITAMINB12, FOLATE, FERRITIN, TIBC, IRON, RETICCTPCT in the last 72 hours. Sepsis Labs: Recent Labs  Lab 07/02/18 1346 07/02/18 1356 07/02/18 1543  PROCALCITON <0.10  --   --  LATICACIDVEN  --  4.3* 3.2*    Recent Results (from the past 240 hour(s))  Culture, blood (Routine x 2)     Status: Abnormal   Collection Time: 07/02/18  1:56 PM  Result Value Ref Range Status   Specimen Description   Final    BLOOD RIGHT ARM Performed at [facility], [address], [place], [place] [address]    Special Requests   Final    BOTTLES DRAWN AEROBIC AND ANAEROBIC Blood Culture adequate volume Performed at [facility], [address] [place], [place] [address]    Culture  Setup Time   Final    IN BOTH AEROBIC AND ANAEROBIC BOTTLES GRAM POSITIVE COCCI IN CHAINS Gram Stain Report Called to,Read Back By and  Verified With: K GAVES,RN @0219  07/03/18 MKELLY Performed at [facility], [address], [place], [place] [address]    Culture STREPTOCOCCUS GROUP G (A)  Final   Report Status 07/05/2018 FINAL  Final   Organism ID, Bacteria STREPTOCOCCUS GROUP G  Final      Susceptibility   Streptococcus group g - MIC*    CLINDAMYCIN <=0.25 SENSITIVE Sensitive     AMPICILLIN <=0.25 SENSITIVE Sensitive     ERYTHROMYCIN <=0.12 SENSITIVE Sensitive     VANCOMYCIN 0.5 SENSITIVE Sensitive     CEFTRIAXONE <=0.12 SENSITIVE Sensitive     LEVOFLOXACIN 0.5 SENSITIVE Sensitive     * STREPTOCOCCUS GROUP G  Culture, blood (Routine x 2)     Status: Abnormal   Collection Time: 07/02/18  2:01 PM  Result Value Ref Range Status   Specimen Description   Final    BLOOD LEFT ARM Performed at [facility], [address], [place], [place] [address]    Special Requests   Final    BOTTLES DRAWN AEROBIC AND ANAEROBIC Blood Culture adequate volume Performed at [facility], [address], [place], [place] [address]    Culture  Setup Time   Final    IN BOTH AEROBIC AND ANAEROBIC BOTTLES GRAM POSITIVE COCCI IN CHAINS Gram Stain Report Called to,Read Back By and Verified With: K GRAVES,RN @0219  07/03/18 [facility] Performed at [facility], [address] [place], [place] [address]    Culture (A)  Final    STREPTOCOCCUS GROUP G SUSCEPTIBILITIES PERFORMED ON PREVIOUS CULTURE WITHIN THE LAST 5 DAYS. Performed at [facility], [address], [place], [place] [address]    Report Status 07/05/2018 FINAL  Final  SARS Coronavirus 2 (CEPHEID- Performed in [facility] hospital lab), Hosp Order     Status: None   Collection Time: 07/02/18  2:20 PM  Result Value Ref Range Status   SARS Coronavirus 2 NEGATIVE NEGATIVE Final    Comment: (NOTE) If result is NEGATIVE SARS-CoV-2 target nucleic acids are NOT DETECTED. The SARS-CoV-2 RNA is generally detectable in upper and lower  respiratory specimens during the acute phase of  infection. The lowest  concentration of SARS-CoV-2 viral copies this assay can detect is 250  copies / mL. A negative result does not preclude SARS-CoV-2 infection  and should not be used as the sole basis for treatment or other  patient management decisions.  A negative result may occur with  improper specimen collection / handling, submission of specimen other  than nasopharyngeal swab, presence of viral mutation(s) within the  areas targeted by this assay, and inadequate number of viral copies  (<250 copies / mL). A negative result must be combined with clinical  observations, patient history, and epidemiological information.  If result is POSITIVE SARS-CoV-2 target nucleic acids are DETECTED. The SARS-CoV-2 RNA is generally detectable in upper and lower  respiratory specimens dur ing the acute phase of infection.  Positive  results are indicative of active infection with SARS-CoV-2.  Clinical  correlation with patient history and other diagnostic information is  necessary to determine patient infection status.  Positive results do  not rule out bacterial infection or co-infection with other viruses. If result is PRESUMPTIVE POSTIVE SARS-CoV-2 nucleic acids MAY BE PRESENT.   A presumptive positive result was obtained on the submitted specimen  and confirmed on repeat testing.  While 2019 novel coronavirus  (SARS-CoV-2) nucleic acids may be present in the submitted sample  additional confirmatory testing may be necessary for epidemiological  and / or clinical management purposes  to differentiate between  SARS-CoV-2 and other Sarbecovirus currently known to infect humans.  If clinically indicated additional testing with an alternate test  methodology [phone]) is advised. The SARS-CoV-2 RNA is generally  detectable in upper and lower respiratory sp ecimens during the acute  phase of infection. The expected result is Negative. Fact Sheet for Patients:   BoilerBrush.com.cy Fact Sheet for Healthcare Providers: https://pope.com/ This test is not yet approved or cleared by the [place] FDA and has been authorized for detection and/or diagnosis of SARS-CoV-2 by FDA under an Emergency Use Authorization (EUA).  This EUA will remain in effect (meaning this test can be used) for the duration of the COVID-19 declaration under Section 564(b)(1) of the Act, 21 U.S.C. section 360bbb-3(b)(1), unless the authorization is terminated or revoked sooner. Performed at [facility], [address], [place], [place] [address]   Respiratory Panel by PCR     Status: None   Collection Time: 07/02/18  4:34 PM  Result Value Ref Range Status   Adenovirus NOT DETECTED NOT DETECTED Final   Coronavirus 229E NOT DETECTED NOT DETECTED Final    Comment: (NOTE) The Coronavirus on the Respiratory Panel, DOES NOT test for the novel  Coronavirus (2019 nCoV)    Coronavirus HKU1 NOT DETECTED NOT DETECTED Final   Coronavirus NL63 NOT DETECTED NOT DETECTED Final   Coronavirus OC43 NOT DETECTED NOT DETECTED Final   Metapneumovirus NOT DETECTED NOT DETECTED Final   Rhinovirus / Enterovirus NOT DETECTED NOT DETECTED Final   Influenza A NOT DETECTED NOT DETECTED Final   Influenza B NOT DETECTED NOT DETECTED Final   Parainfluenza Virus 1 NOT DETECTED NOT DETECTED Final   Parainfluenza Virus 2 NOT DETECTED NOT DETECTED Final   Parainfluenza Virus 3 NOT DETECTED NOT DETECTED Final   Parainfluenza Virus 4 NOT DETECTED NOT DETECTED Final   Respiratory Syncytial Virus NOT DETECTED NOT DETECTED Final   Bordetella pertussis NOT DETECTED NOT DETECTED Final   Chlamydophila pneumoniae NOT DETECTED NOT DETECTED Final   Mycoplasma pneumoniae NOT DETECTED NOT DETECTED Final    Comment: Performed at [facility], [address], [place], [place] [address]  MRSA PCR Screening     Status: None   Collection Time: 07/03/18  8:12 AM   Result Value Ref Range Status   MRSA by PCR NEGATIVE NEGATIVE Final    Comment:        The GeneXpert MRSA Assay (FDA approved for NASAL specimens only), is one component of a comprehensive MRSA colonization surveillance program. It is not intended to diagnose MRSA infection nor to guide or monitor treatment for MRSA infections. Performed at [facility], [address] [place], [place] [address]  Culture, blood (routine x 2)     Status: None (Preliminary result)   Collection Time: 07/04/18 10:10 AM  Result Value Ref Range Status   Specimen Description BLOOD LEFT HAND  Final   Special Requests   Final    BOTTLES DRAWN AEROBIC AND ANAEROBIC Blood Culture adequate volume   Culture   Final    NO GROWTH 2 DAYS Performed at [facility], [address] [place], [place] [address]    Report Status PENDING  Incomplete  Culture, blood (routine x 2)     Status: None (Preliminary result)   Collection Time: 07/04/18 10:20 AM  Result Value Ref Range Status   Specimen Description BLOOD RIGHT HAND  Final   Special Requests   Final    BOTTLES DRAWN AEROBIC AND ANAEROBIC Blood Culture adequate volume   Culture   Final    NO GROWTH 2 DAYS Performed at [facility], [address], [place], [place] [address]    Report Status PENDING  Incomplete         Radiology Studies: US Venous Img Lower Unilateral Right  Result Date: 07/06/2018 CLINICAL DATA:  68 year old male with right lower leg pain and swelling EXAM: RIGHT LOWER EXTREMITY VENOUS DOPPLER ULTRASOUND TECHNIQUE: Gray-scale sonography with graded compression, as well as color Doppler and duplex ultrasound were performed to evaluate the lower extremity deep venous systems from the level of the common femoral vein and including the common femoral, femoral, profunda femoral, popliteal and calf veins including the posterior tibial, peroneal and gastrocnemius veins when visible. The superficial great saphenous vein was also interrogated.  Spectral Doppler was utilized to evaluate flow at rest and with distal augmentation maneuvers in the common femoral, femoral and popliteal veins. COMPARISON:  None. FINDINGS: Contralateral Common Femoral Vein: Respiratory phasicity is normal and symmetric with the symptomatic side. No evidence of thrombus. Normal compressibility. Common Femoral Vein: No evidence of thrombus. Normal compressibility, respiratory phasicity and response to augmentation. Saphenofemoral Junction: No evidence of thrombus. Normal compressibility and flow on color Doppler imaging. Profunda Femoral Vein: No evidence of thrombus. Normal compressibility and flow on color Doppler imaging. Femoral Vein: No evidence of thrombus. Normal compressibility, respiratory phasicity and response to augmentation. Popliteal Vein: No evidence of thrombus. Normal compressibility, respiratory phasicity and response to augmentation. Calf Veins: Peroneal vein without thrombus. Posterior tibial vein not well visualized. Superficial Great Saphenous Vein: No evidence of thrombus. Normal compressibility and flow on color Doppler imaging. Other Findings: Lymph node in the right inguinal region enlarged in the short axis dimension. Edema IMPRESSION: Sonographic survey of the right lower extremity negative for DVT Edema Enlarged right inguinal lymph node. This may be reactive given the presence of edema. Correlation with presentation and lab values may be useful if there is concern for lymphoproliferative disorder. Electronically Signed   By: Gilmer Mor D.O.   On: 07/06/2018 10:12        Scheduled Meds: . amLODipine  5 mg Oral Daily  . cholecalciferol  1,000 Units Oral Daily  . fluticasone  2 spray Each Nare Daily  . insulin aspart  0-15 Units Subcutaneous TID WC  . insulin aspart  0-5 Units Subcutaneous QHS  . losartan  100 mg Oral Daily  . metoprolol succinate  25 mg Oral Daily  . pantoprazole  40 mg Oral Daily  . rivaroxaban  20 mg Oral Daily  .  vitamin B-12  500 mcg Oral Daily   Continuous Infusions: . azithromycin 500 mg (07/05/18 1737)  .  cefTRIAXone (ROCEPHIN)  IV 2 g (07/05/18 1953)     LOS: 3 days    Time spent: 30 minutes    Gibran Veselka Hoover Brunette, DO [facility] Pager [phone]  If 7PM-7AM, please contact night-coverage [email or web address] Password [facility] 07/06/2018, 12:59 PM

## 2018-07-07 LAB — CBC
HCT: 38.9 % — ABNORMAL LOW (ref 39.0–52.0)
Hemoglobin: 13.1 g/dL (ref 13.0–17.0)
MCH: 29.7 pg (ref 26.0–34.0)
MCHC: 33.7 g/dL (ref 30.0–36.0)
MCV: 88.2 fL (ref 80.0–100.0)
Platelets: 118 10*3/uL — ABNORMAL LOW (ref 150–400)
RBC: 4.41 MIL/uL (ref 4.22–5.81)
RDW: 12.7 % (ref 11.5–15.5)
WBC: 4.5 10*3/uL (ref 4.0–10.5)
nRBC: 0 % (ref 0.0–0.2)

## 2018-07-07 LAB — BASIC METABOLIC PANEL
Anion gap: 12 (ref 5–15)
BUN: 12 mg/dL (ref 8–23)
CO2: 26 mmol/L (ref 22–32)
Calcium: 8.7 mg/dL — ABNORMAL LOW (ref 8.9–10.3)
Chloride: 101 mmol/L (ref 98–111)
Creatinine, Ser: 0.63 mg/dL (ref 0.61–1.24)
GFR calc Af Amer: >60 mL/min (ref >60)
GFR calc non Af Amer: >60 mL/min (ref >60)
Glucose, Bld: 156 mg/dL — ABNORMAL HIGH (ref 70–99)
Potassium: 3.9 mmol/L (ref 3.5–5.1)
Sodium: 139 mmol/L (ref 135–145)

## 2018-07-07 LAB — GLUCOSE, CAPILLARY
Glucose-Capillary: 174 mg/dL — ABNORMAL HIGH (ref 70–99)
Glucose-Capillary: 158 mg/dL — ABNORMAL HIGH (ref 70–99)

## 2018-07-07 MED ORDER — CEFTRIAXONE IV (FOR PTA / DISCHARGE USE ONLY): 2.0000 g | INTRAVENOUS | 0 refills | Status: AC

## 2018-07-07 NOTE — Discharge Summary (Signed)
Physician Discharge Summary  [name] WJX:[id number] DOB: [birth year] DOA: 07/02/2018  PCP: Susy Frizzle, MD  Admit date: 07/02/2018  Discharge date: 07/07/2018  Admitted From:Home  Disposition:  Home  Recommendations for Outpatient Follow-up:  1. Follow up with PCP in 1-2 weeks 2. Continue on IV Rocephin daily as scheduled through 08/14/2018 3. Follow-up with ID Dr. Drucilla Schmidt in early June which will be scheduled in the outpatient setting 4. Patient will have repeat lab work weekly  Home Health: Yes with RN  Equipment/Devices: Home infusions have been set up  Discharge Condition: Stable  CODE STATUS: Full  Diet recommendation: Heart Healthy/carb modified  Brief/Interim Summary: Per HPI: LANNY LIPKIN Jr.is a 68 y.o.malewith medical history significant ofnonalcoholic liver cirrhosis, celiac disease, diabetes mellitus type 2, hypertension came to the hospital with complaints of fever and fatigue. Patient states he was feeling well up until yesterday evening but this morning when he woke up he started having subjective fevers and chills with feeling of nausea. Denies any abdominal pain, shortness of breath, cough or any other symptoms. Denies any sick contacts. In the ER he was noted to have temperature of 101.1 which went up to 102.3. COVID-negative. Saturating greater than 90% on room air. Chest x-ray showed questionable infiltrate at the bases. Normal WBC. While in the ER he had one episode of diarrhea but no more since. It was nonbloody. He was started on IV Rocephin and azithromycin admitted to the hospital.  Patient was admitted with fever, initially with unknown etiology, but it now appears that he has strep group G bacteremia. Hedenies any further symptomatology and has been without fever in the last48hours. Consulted with cardiology on 5/22 who stated that TEE currently appears unnecessary. Unfortunately, no source of infection is otherwise noted.  Discussed with ID Dr. Drucilla Schmidt earlier this morning on 5/23 and increased dose of Rocephin for bacteremia coverage.  Repeat blood cultures have been negative since 5/22.  He has been noted to have some right lower extremity swelling for which repeat ultrasound was performed and was negative for DVT.  He follows up with Dr. Walden Field regarding his DVT management and is currently on Xarelto.  He does appear to have some mild findings of cellulitis to that site which should improve with treatment on IV antibiotics as currently scheduled.  He will maintain leg elevation as well as compression stockings to assist with the swelling on that side.  He has received a PICC line to his right upper extremity on 5/24.  He has otherwise been set up with home health services and home infusions to continue for the next 6 weeks with IV Rocephin 2 g daily.  He will follow-up with Dr. Drucilla Schmidt in the outpatient setting for follow-up.  He will also have weekly lab work performed.  No other acute events noted during the course of this admission.  Discharge Diagnoses:  Principal Problem:   Fever Active Problems:   Type II diabetes mellitus, uncontrolled (HCC)   HTN (hypertension), benign   HLD (hyperlipidemia)   PVD (peripheral vascular disease) (HCC)   Gastritis and duodenitis   Celiac disease   Fatigue   NAFLD (nonalcoholic fatty liver disease)   Cirrhosis, non-alcoholic (HCC)   Bacteremia due to Gram-positive bacteria  Principal discharge diagnosis: Strep group G bacteremia.  Discharge Instructions  Discharge Instructions    Diet - low sodium heart healthy   Complete by:  As directed    Home infusion instructions [facility] May follow [facility]  Pharmacy Dosing Protocol; May administer Cathflo as needed to maintain patency of vascular access device.; Flushing of vascular access device: per [facility] Protocol: 0.9% NaCl pre/post medica...   Complete by:  As directed    Instructions:  May follow [place] Dosing  Protocol   Instructions:  May administer Cathflo as needed to maintain patency of vascular access device.   Instructions:  Flushing of vascular access device: per [facility] Protocol: 0.9% NaCl pre/post medication administration and prn patency; Heparin 100 u/ml, 26m for implanted ports and Heparin 10u/ml, 567mfor all other central venous catheters.   Instructions:  May follow [facility] Anaphylaxis Protocol for First Dose Administration in the home: 0.9% NaCl at 25-50 ml/hr to maintain IV access for protocol meds. Epinephrine 0.3 ml IV/IM PRN and Benadryl 25-50 IV/IM PRN s/s of anaphylaxis.   Instructions:  [facility] [place] Coordinator (RN) to assist per patient IV care needs in the home PRN.   Increase activity slowly   Complete by:  As directed      Allergies as of 07/07/2018      Reactions   Gluten Meal       Medication List    TAKE these medications   amLODipine 5 MG tablet Commonly known as:  NORVASC TAKE 1 TABLET BY MOUTH EVERY DAY What changed:  when to take this   cefTRIAXone  IVPB Commonly known as:  ROCEPHIN Inject 2 g into the vein daily for 40 doses. Indication:  Streptococcal bacteremia           Last Day of Therapy:  08/14/2018 Labs - Once weekly:  CBC/D and BMP, Labs - Every other week:  ESR and CRP   cholecalciferol 25 MCG (1000 UT) tablet Commonly known as:  VITAMIN D3 Take 1,000 Units by mouth daily.   fluticasone 50 MCG/ACT nasal spray Commonly known as:  FLONASE Place 2 sprays into both nostrils daily. What changed:    when to take this  reasons to take this   glimepiride 4 MG tablet Commonly known as:  AMARYL Take 0.5 tablets (2 mg total) by mouth daily.   losartan 100 MG tablet Commonly known as:  COZAAR Take 1 tablet by mouth once daily What changed:  when to take this   metFORMIN 500 MG tablet Commonly known as:  GLUCOPHAGE TAKE 1 TABLET BY MOUTH 2 TIMES DAILY WITH A MEAL. What changed:  See the new instructions.   metoprolol succinate 25 MG  24 hr tablet Commonly known as:  TOPROL-XL Take 1 tablet by mouth once daily   pantoprazole 40 MG tablet Commonly known as:  PROTONIX Take 1 tablet (40 mg total) by mouth daily.   vitamin B-12 500 MCG tablet Commonly known as:  CYANOCOBALAMIN Take 500 mcg by mouth daily.   Xarelto 20 MG Tabs tablet Generic drug:  rivaroxaban TAKE 1 TABLET BY MOUTH DAILY WITH SUPPER. What changed:  See the new instructions.            Home Infusion Instuctions  (From admission, onward)         Start     Ordered   07/07/18 0000  Home infusion instructions [facility] May follow [facility] Protocol; May administer Cathflo as needed to maintain patency of vascular access device.; Flushing of vascular access device: [facility]: 0.9% NaCl pre/post medica...    Question Answer Comment  Instructions [place] follow [facility] Protocol   Instructions May administer Cathflo as needed to maintain patency of  vascular access device.   Instructions Flushing of vascular access device: per [facility] Protocol: 0.9% NaCl pre/post medication administration and prn patency; Heparin 100 u/ml, 64m for implanted ports and Heparin 10u/ml, 533mfor all other central venous catheters.   Instructions May follow [facility] Anaphylaxis Protocol for First Dose Administration in the home: 0.9% NaCl at 25-50 ml/hr to maintain IV access for protocol meds. Epinephrine 0.3 ml IV/IM PRN and Benadryl 25-50 IV/IM PRN s/s of anaphylaxis.   Instructions [facility] Infusion Coordinator (RN) to assist per patient IV care needs in the home PRN.      07/07/18 1049         Follow-up Information    PiSusy FrizzleMD Follow up in 1 week(s).   Specialty:  Family Medicine Contact information: [id number] Follow up in 1 month(s).   Specialty:  Infectious Diseases Why:  Will arrange appointment in early June for follow  up. Contact information: [phone]        Allergies  Allergen Reactions  . Gluten Meal     Consultations:  ID on phone  Cardiology   Procedures/Studies: Us[place]enous Img Lower Unilateral Right  Result Date: 07/06/2018 CLINICAL DATA:  [age]ear old male with right lower leg pain and swelling EXAM: RIGHT LOWER EXTREMITY VENOUS DOPPLER ULTRASOUND TECHNIQUE: Gray-scale sonography with graded compression, as well as color Doppler and duplex ultrasound were performed to evaluate the lower extremity deep venous systems from the level of the common femoral vein and including the common femoral, femoral, profunda femoral, popliteal and calf veins including the posterior tibial, peroneal and gastrocnemius veins when visible. The superficial great saphenous vein was also interrogated. Spectral Doppler was utilized to evaluate flow at rest and with distal augmentation maneuvers in the common femoral, femoral and popliteal veins. COMPARISON:  None. FINDINGS: Contralateral Common Femoral Vein: Respiratory phasicity is normal and symmetric with the symptomatic side. No evidence of thrombus. Normal compressibility. Common Femoral Vein: No evidence of thrombus. Normal compressibility, respiratory phasicity and response to augmentation. Saphenofemoral Junction: No evidence of thrombus. Normal compressibility and flow on color Doppler imaging. Profunda Femoral Vein: No evidence of thrombus. Normal compressibility and flow on color Doppler imaging. Femoral Vein: No evidence of thrombus. Normal compressibility, respiratory phasicity and response to augmentation. Popliteal Vein: No evidence of thrombus. Normal compressibility, respiratory phasicity and response to augmentation. Calf Veins: Peroneal vein without thrombus. Posterior tibial vein not well visualized. Superficial Great Saphenous Vein: No evidence of thrombus. Normal compressibility and flow on color Doppler  imaging. Other Findings: Lymph node in the right inguinal region enlarged in the short axis dimension. Edema IMPRESSION: Sonographic survey of the right lower extremity negative for DVT Edema Enlarged right inguinal lymph node. This may be reactive given the presence of edema. Correlation with presentation and lab values may be useful if there is concern for lymphoproliferative disorder. Electronically Signed   By: JaCorrie Mckusick.O.   On: 07/06/2018 10:12   Dg Chest Port 1 View  Result Date: 07/02/2018 CLINICAL DATA:  Sepsis EXAM: PORTABLE CHEST 1 VIEW COMPARISON:  08/29/2016 FINDINGS: Evaluation is limited by technique and body habitus. There is diffuse increased attenuation of the lung bases bilaterally. There is no pneumothorax. No large pleural effusion. The cardiac silhouette is within normal limits. IMPRESSION: 1. Evaluation limited by technique. 2. Diffuse increased attenuation of  the lung bases bilaterally may be secondary to soft tissue attenuation artifact. An underlying infiltrate is difficult to exclude. A follow-up two-view chest x-ray is recommended for further evaluation. Electronically Signed   By: Constance Holster M.D.   On: 07/02/2018 15:17     Discharge Exam: Vitals:   07/07/18 0550 07/07/18 0839  BP: (!) 145/63   Pulse: 75   Resp: 18   Temp: 98.7 F (37.1 C)   SpO2: 97% 96%   Vitals:   07/06/18 2054 07/06/18 2205 07/07/18 0550 07/07/18 0839  BP:  (!) 148/68 (!) 145/63   Pulse: 77 78 75   Resp: 16 16 18    Temp:  98.9 F (37.2 C) 98.7 F (37.1 C)   TempSrc:  Oral Oral   SpO2: 95% 96% 97% 96%  Weight:   117.3 kg   Height:        General: Pt is alert, awake, not in acute distress Cardiovascular: RRR, S1/S2 +, no rubs, no gallops Respiratory: CTA bilaterally, no wheezing, no rhonchi Abdominal: Soft, NT, ND, bowel sounds + Extremities: no edema, no cyanosis, right lower extremity with some swelling and mild erythema    The results of significant diagnostics  from this hospitalization (including imaging, microbiology, ancillary and laboratory) are listed below for reference.     Microbiology: Recent Results (from the past 240 hour(s))  Culture, blood (Routine x 2)     Status: Abnormal   Collection Time: 07/02/18  1:56 PM  Result Value Ref Range Status   Specimen Description   Final    BLOOD RIGHT ARM Performed at [facility], [address], [place], [place] [address]    Special Requests   Final    BOTTLES DRAWN AEROBIC AND ANAEROBIC Blood Culture adequate volume Performed at [facility], [address], [place], [place] [address]    Culture  Setup Time   Final    IN BOTH AEROBIC AND ANAEROBIC BOTTLES GRAM POSITIVE COCCI IN CHAINS Gram Stain Report Called to,Read Back By and Verified With: K GAVES,RN @0219  07/03/18 MKELLY Performed at [facility], [address], [place], [place] [address]    Culture STREPTOCOCCUS GROUP G (A)  Final   Report Status 07/05/2018 FINAL  Final   Organism ID, Bacteria STREPTOCOCCUS GROUP G  Final      Susceptibility   Streptococcus group g - MIC*    CLINDAMYCIN <=0.25 SENSITIVE Sensitive     AMPICILLIN <=0.25 SENSITIVE Sensitive     ERYTHROMYCIN <=0.12 SENSITIVE Sensitive     VANCOMYCIN 0.5 SENSITIVE Sensitive     CEFTRIAXONE <=0.12 SENSITIVE Sensitive     LEVOFLOXACIN 0.5 SENSITIVE Sensitive     * STREPTOCOCCUS GROUP G  Culture, blood (Routine x 2)     Status: Abnormal   Collection Time: 07/02/18  2:01 PM  Result Value Ref Range Status   Specimen Description   Final    BLOOD LEFT ARM Performed at [facility], [address], [place], [place] [address]    Special Requests   Final    BOTTLES DRAWN AEROBIC AND ANAEROBIC Blood Culture adequate volume Performed at [facility], [address], [place], [place] [address]    Culture  Setup Time   Final    IN BOTH AEROBIC AND ANAEROBIC BOTTLES GRAM POSITIVE COCCI IN CHAINS Gram Stain Report Called to,Read Back By and Verified With: K GRAVES,RN @0219   07/03/18 [facility] Performed at [facility], [address], [place], [place] [address]    Culture (A)  Final  STREPTOCOCCUS GROUP G SUSCEPTIBILITIES PERFORMED ON PREVIOUS CULTURE WITHIN THE LAST 5 DAYS. Performed at [facility], [place] [address], [place], [place] [address]    Report Status 07/05/2018 FINAL  Final  SARS Coronavirus 2 (CEPHEID- Performed in [place] hospital lab), Hosp Order     Status: None   Collection Time: 07/02/18  2:20 PM  Result Value Ref Range Status   SARS Coronavirus 2 NEGATIVE NEGATIVE Final    Comment: (NOTE) If result is NEGATIVE SARS-CoV-2 target nucleic acids are NOT DETECTED. The SARS-CoV-2 RNA is generally detectable in upper and lower  respiratory specimens during the acute phase of infection. The lowest  concentration of SARS-CoV-2 viral copies this assay can detect is 250  copies / mL. A negative result does not preclude SARS-CoV-2 infection  and should not be used as the sole basis for treatment or other  patient management decisions.  A negative result may occur with  improper specimen collection / handling, submission of specimen other  than nasopharyngeal swab, presence of viral mutation(s) within the  areas targeted by this assay, and inadequate number of viral copies  (<250 copies / mL). A negative result must be combined with clinical  observations, patient history, and epidemiological information. If result is POSITIVE SARS-CoV-2 target nucleic acids are DETECTED. The SARS-CoV-2 RNA is generally detectable in upper and lower  respiratory specimens dur ing the acute phase of infection.  Positive  results are indicative of active infection with SARS-CoV-2.  Clinical  correlation with patient history and other diagnostic information is  necessary to determine patient infection status.  Positive results do  not rule out bacterial infection or co-infection with other viruses. If result is PRESUMPTIVE POSTIVE SARS-CoV-2 nucleic acids  MAY BE PRESENT.   A presumptive positive result was obtained on the submitted specimen  and confirmed on repeat testing.  While 2019 novel coronavirus  (SARS-CoV-2) nucleic acids may be present in the submitted sample  additional confirmatory testing may be necessary for epidemiological  and / or clinical management purposes  to differentiate between  SARS-CoV-2 and other Sarbecovirus currently known to infect humans.  If clinically indicated additional testing with an alternate test  methodology [phone]) is advised. The SARS-CoV-2 RNA is generally  detectable in upper and lower respiratory sp ecimens during the acute  phase of infection. The expected result is Negative. Fact Sheet for Patients:  StrictlyIdeas.no Fact Sheet for Healthcare Providers: BankingDealers.co.za This test is not yet approved or cleared by the [place] FDA and has been authorized for detection and/or diagnosis of SARS-CoV-2 by FDA under an Emergency Use Authorization (EUA).  This EUA will remain in effect (meaning this test can be used) for the duration of the COVID-19 declaration under Section 564(b)(1) of the Act, 21 U.S.C. section 360bbb-3(b)(1), unless the authorization is terminated or revoked sooner. Performed at [facility], [address] [place], [place] [address]   Respiratory Panel by PCR     Status: None   Collection Time: 07/02/18  4:34 PM  Result Value Ref Range Status   Adenovirus NOT DETECTED NOT DETECTED Final   Coronavirus 229E NOT DETECTED NOT DETECTED Final    Comment: (NOTE) The Coronavirus on the Respiratory Panel, DOES NOT test for the novel  Coronavirus (2019 nCoV)    Coronavirus HKU1 NOT DETECTED NOT DETECTED Final   Coronavirus NL63 NOT DETECTED NOT DETECTED Final   Coronavirus OC43 NOT DETECTED NOT DETECTED Final   Metapneumovirus NOT DETECTED NOT DETECTED Final  Rhinovirus / Enterovirus NOT DETECTED NOT DETECTED Final    Influenza A NOT DETECTED NOT DETECTED Final   Influenza B NOT DETECTED NOT DETECTED Final   Parainfluenza Virus 1 NOT DETECTED NOT DETECTED Final   Parainfluenza Virus 2 NOT DETECTED NOT DETECTED Final   Parainfluenza Virus 3 NOT DETECTED NOT DETECTED Final   Parainfluenza Virus 4 NOT DETECTED NOT DETECTED Final   Respiratory Syncytial Virus NOT DETECTED NOT DETECTED Final   Bordetella pertussis NOT DETECTED NOT DETECTED Final   Chlamydophila pneumoniae NOT DETECTED NOT DETECTED Final   Mycoplasma pneumoniae NOT DETECTED NOT DETECTED Final    Comment: Performed at [facility], [place] [address], [place], [place] [address]  MRSA PCR Screening     Status: None   Collection Time: 07/03/18  8:12 AM  Result Value Ref Range Status   MRSA by PCR NEGATIVE NEGATIVE Final    Comment:        The GeneXpert MRSA Assay (FDA approved for NASAL specimens only), is one component of a comprehensive MRSA colonization surveillance program. It is not intended to diagnose MRSA infection nor to guide or monitor treatment for MRSA infections. Performed at [facility], [address] [place], [place] [address]   Culture, blood (routine x 2)     Status: None (Preliminary result)   Collection Time: 07/04/18 10:10 AM  Result Value Ref Range Status   Specimen Description BLOOD LEFT HAND  Final   Special Requests   Final    BOTTLES DRAWN AEROBIC AND ANAEROBIC Blood Culture adequate volume   Culture   Final    NO GROWTH 3 DAYS Performed at [facility], [address], [place], [place] [address]    Report Status PENDING  Incomplete  Culture, blood (routine x 2)     Status: None (Preliminary result)   Collection Time: 07/04/18 10:20 AM  Result Value Ref Range Status   Specimen Description BLOOD RIGHT HAND  Final   Special Requests   Final    BOTTLES DRAWN AEROBIC AND ANAEROBIC Blood Culture adequate volume   Culture   Final    NO GROWTH 3 DAYS Performed at [facility], [address]  [place], [place] [address]    Report Status PENDING  Incomplete     Labs: BNP (last 3 results) Recent Labs    07/02/18 1346  BNP 563.8*   Basic Metabolic Panel: Recent Labs  Lab 07/02/18 1356 07/03/18 0627 07/04/18 0515 07/05/18 0623 07/07/18 0436  NA 137 136 136 137 139  K 3.8 3.2* 4.0 3.7 3.9  CL 101 104 105 103 101  CO2 21* 23 23 25 26   GLUCOSE 223* 191* 148* 168* 156*  BUN 13 14 12 11 12   CREATININE 0.88 0.75 0.77 0.67 0.63  CALCIUM 8.9 7.9* 7.7* 7.9* 8.7*  MG  --  1.4* 2.0 2.1  --    Liver Function Tests: Recent Labs  Lab 07/02/18 1356  AST 40  ALT 59*  ALKPHOS 63  BILITOT 1.2  PROT 7.7  ALBUMIN 4.2   No results for input(s): LIPASE, AMYLASE in the last 168 hours. No results for input(s): AMMONIA in the last 168 hours. CBC: Recent Labs  Lab 07/02/18 1356 07/03/18 0627 07/04/18 0515 07/05/18 0623 07/07/18 0436  WBC 7.3 6.2 4.7 4.3 4.5  NEUTROABS 6.3  --   --   --   --   HGB 14.4 12.4* 12.4* 12.3* 13.1  HCT 42.6 36.9* 36.2* 36.5* 38.9*  MCV 88.6 89.8  89.2 88.8 88.2  PLT 93* 70* 67* 83* 118*   Cardiac Enzymes: No results for input(s): CKTOTAL, CKMB, CKMBINDEX, TROPONINI in the last 168 hours. BNP: Invalid input(s): POCBNP CBG: Recent Labs  Lab 07/06/18 0721 07/06/18 1107 07/06/18 1616 07/06/18 2201 07/07/18 0721  GLUCAP 153* 119* 125* 121* 174*   D-Dimer No results for input(s): DDIMER in the last 72 hours. Hgb A1c No results for input(s): HGBA1C in the last 72 hours. Lipid Profile No results for input(s): CHOL, HDL, LDLCALC, TRIG, CHOLHDL, LDLDIRECT in the last 72 hours. Thyroid function studies No results for input(s): TSH, T4TOTAL, T3FREE, THYROIDAB in the last 72 hours.  Invalid input(s): FREET3 Anemia work up No results for input(s): VITAMINB12, FOLATE, FERRITIN, TIBC, IRON, RETICCTPCT in the last 72 hours. Urinalysis    Component Value Date/Time   COLORURINE AMBER (A) 07/02/2018 1356   APPEARANCEUR HAZY (A) 07/02/2018 1356    LABSPEC 1.028 07/02/2018 1356   PHURINE 5.0 07/02/2018 1356   GLUCOSEU NEGATIVE 07/02/2018 1356   HGBUR SMALL (A) 07/02/2018 1356   BILIRUBINUR NEGATIVE 07/02/2018 [place] 07/02/2018 1356   PROTEINUR 100 (A) 07/02/2018 1356   NITRITE NEGATIVE 07/02/2018 1356   LEUKOCYTESUR NEGATIVE 07/02/2018 1356   Sepsis Labs Invalid input(s): PROCALCITONIN,  WBC,  LACTICIDVEN Microbiology Recent Results (from the past 240 hour(s))  Culture, blood (Routine x 2)     Status: Abnormal   Collection Time: 07/02/18  1:56 PM  Result Value Ref Range Status   Specimen Description   Final    BLOOD RIGHT ARM Performed at [facility], [address] [place], [place] [address]    Special Requests   Final    BOTTLES DRAWN AEROBIC AND ANAEROBIC Blood Culture adequate volume Performed at [facility], [address] [place], [place] [address]    Culture  Setup Time   Final    IN BOTH AEROBIC AND ANAEROBIC BOTTLES GRAM POSITIVE COCCI IN CHAINS Gram Stain Report Called to,Read Back By and Verified With: K GAVES,RN @0219  07/03/18 [facility] Performed at [facility], [address], [place], [place] [address]    Culture STREPTOCOCCUS GROUP G (A)  Final   Report Status 07/05/2018 FINAL  Final   Organism ID, Bacteria STREPTOCOCCUS GROUP G  Final      Susceptibility   Streptococcus group g - MIC*    CLINDAMYCIN <=0.25 SENSITIVE Sensitive     AMPICILLIN <=0.25 SENSITIVE Sensitive     ERYTHROMYCIN <=0.12 SENSITIVE Sensitive     VANCOMYCIN 0.5 SENSITIVE Sensitive     CEFTRIAXONE <=0.12 SENSITIVE Sensitive     LEVOFLOXACIN 0.5 SENSITIVE Sensitive     * STREPTOCOCCUS GROUP G  Culture, blood (Routine x 2)     Status: Abnormal   Collection Time: 07/02/18  2:01 PM  Result Value Ref Range Status   Specimen Description   Final    BLOOD LEFT ARM Performed at [facility], [address] [place], [place] [address]    Special Requests   Final    BOTTLES DRAWN AEROBIC AND ANAEROBIC Blood Culture  adequate volume Performed at [facility], [address], [place], [place] [address]    Culture  Setup Time   Final    IN BOTH AEROBIC AND ANAEROBIC BOTTLES GRAM POSITIVE COCCI IN CHAINS Gram Stain Report Called to,Read Back By and Verified With: K GRAVES,RN @0219  07/03/18 [facility] Performed at [facility] [address] [place],  [address]    Culture (A)  Final    STREPTOCOCCUS  GROUP G SUSCEPTIBILITIES PERFORMED ON PREVIOUS CULTURE WITHIN THE LAST 5 DAYS. Performed at [facility], [place] [address] [place], [place] [address]    Report Status 07/05/2018 FINAL  Final  SARS Coronavirus 2 (CEPHEID- Performed in [place] hospital lab), Hosp Order     Status: None   Collection Time: 07/02/18  2:20 PM  Result Value Ref Range Status   SARS Coronavirus 2 NEGATIVE NEGATIVE Final    Comment: (NOTE) If result is NEGATIVE SARS-CoV-2 target nucleic acids are NOT DETECTED. The SARS-CoV-2 RNA is generally detectable in upper and lower  respiratory specimens during the acute phase of infection. The lowest  concentration of SARS-CoV-2 viral copies this assay can detect is 250  copies / mL. A negative result does not preclude SARS-CoV-2 infection  and should not be used as the sole basis for treatment or other  patient management decisions.  A negative result may occur with  improper specimen collection / handling, submission of specimen other  than nasopharyngeal swab, presence of viral mutation(s) within the  areas targeted by this assay, and inadequate number of viral copies  (<250 copies / mL). A negative result must be combined with clinical  observations, patient history, and epidemiological information. If result is POSITIVE SARS-CoV-2 target nucleic acids are DETECTED. The SARS-CoV-2 RNA is generally detectable in upper and lower  respiratory specimens dur ing the acute phase of infection.  Positive  results are indicative of active infection with SARS-CoV-2.  Clinical   correlation with patient history and other diagnostic information is  necessary to determine patient infection status.  Positive results do  not rule out bacterial infection or co-infection with other viruses. If result is PRESUMPTIVE POSTIVE SARS-CoV-2 nucleic acids MAY BE PRESENT.   A presumptive positive result was obtained on the submitted specimen  and confirmed on repeat testing.  While 2019 novel coronavirus  (SARS-CoV-2) nucleic acids may be present in the submitted sample  additional confirmatory testing may be necessary for epidemiological  and / or clinical management purposes  to differentiate between  SARS-CoV-2 and other Sarbecovirus currently known to infect humans.  If clinically indicated additional testing with an alternate test  methodology [phone]) is advised. The SARS-CoV-2 RNA is generally  detectable in upper and lower respiratory sp ecimens during the acute  phase of infection. The expected result is Negative. Fact Sheet for Patients:  StrictlyIdeas.no Fact Sheet for Healthcare Providers: BankingDealers.co.za This test is not yet approved or cleared by the [place] FDA and has been authorized for detection and/or diagnosis of SARS-CoV-2 by FDA under an Emergency Use Authorization (EUA).  This EUA will remain in effect (meaning this test can be used) for the duration of the COVID-19 declaration under Section 564(b)(1) of the Act, 21 U.S.C. section 360bbb-3(b)(1), unless the authorization is terminated or revoked sooner. Performed at [facility], [address], [place], [place] [address]   Respiratory Panel by PCR     Status: None   Collection Time: 07/02/18  4:34 PM  Result Value Ref Range Status   Adenovirus NOT DETECTED NOT DETECTED Final   Coronavirus 229E NOT DETECTED NOT DETECTED Final    Comment: (NOTE) The Coronavirus on the Respiratory Panel, DOES NOT test for the novel  Coronavirus (2019 nCoV)     Coronavirus HKU1 NOT DETECTED NOT DETECTED Final   Coronavirus NL63 NOT DETECTED NOT DETECTED Final   Coronavirus OC43 NOT DETECTED NOT DETECTED Final   Metapneumovirus NOT DETECTED NOT DETECTED Final  Rhinovirus / Enterovirus NOT DETECTED NOT DETECTED Final   Influenza A NOT DETECTED NOT DETECTED Final   Influenza B NOT DETECTED NOT DETECTED Final   Parainfluenza Virus 1 NOT DETECTED NOT DETECTED Final   Parainfluenza Virus 2 NOT DETECTED NOT DETECTED Final   Parainfluenza Virus 3 NOT DETECTED NOT DETECTED Final   Parainfluenza Virus 4 NOT DETECTED NOT DETECTED Final   Respiratory Syncytial Virus NOT DETECTED NOT DETECTED Final   Bordetella pertussis NOT DETECTED NOT DETECTED Final   Chlamydophila pneumoniae NOT DETECTED NOT DETECTED Final   Mycoplasma pneumoniae NOT DETECTED NOT DETECTED Final    Comment: Performed at [facility], [place] [address], [place], [place] [address]  MRSA PCR Screening     Status: None   Collection Time: 07/03/18  8:12 AM  Result Value Ref Range Status   MRSA by PCR NEGATIVE NEGATIVE Final    Comment:        The GeneXpert MRSA Assay (FDA approved for NASAL specimens only), is one component of a comprehensive MRSA colonization surveillance program. It is not intended to diagnose MRSA infection nor to guide or monitor treatment for MRSA infections. Performed at [facility], [address], [place], [place] [address]   Culture, blood (routine x 2)     Status: None (Preliminary result)   Collection Time: 07/04/18 10:10 AM  Result Value Ref Range Status   Specimen Description BLOOD LEFT HAND  Final   Special Requests   Final    BOTTLES DRAWN AEROBIC AND ANAEROBIC Blood Culture adequate volume   Culture   Final    NO GROWTH 3 DAYS Performed at [facility], [address] [place], [place] [address]    Report Status PENDING  Incomplete  Culture, blood (routine x 2)     Status: None (Preliminary result)   Collection Time: 07/04/18 10:20 AM   Result Value Ref Range Status   Specimen Description BLOOD RIGHT HAND  Final   Special Requests   Final    BOTTLES DRAWN AEROBIC AND ANAEROBIC Blood Culture adequate volume   Culture   Final    NO GROWTH 3 DAYS Performed at [facility], [address], [place],  [address]    Report Status PENDING  Incomplete     Time coordinating discharge: 35 minutes  SIGNED:   Rodena Goldmann, DO [facility] 07/07/2018, 11:07 AM  If 7PM-7AM, please contact night-coverage [email or web address] Password [email or web address]

## 2018-07-07 NOTE — Care Management Important Message (Signed)
Important Message  Patient Details  Name: Joel Reed. MRN: [id number] Date of Birth: 08/20/1950   Medicare Important Message Given:  Yes    Tommy Medal 07/07/2018, 12:03 PM

## 2018-07-07 NOTE — TOC Transition Note (Signed)
Transition of Care [facility]) - CM/SW Discharge Note   Patient Details  Name: Joel Reed. MRN: [id number] Date of Birth: October 24, 1950  Transition of Care [facility]) CM/SW Contact:  Sherald Barge, RN Phone Number: 07/07/2018, 10:41 AM   Clinical Narrative:   Pt ready for DC home. Needs home IV abx. Has family who are able and willing to assist. [facility] has chosen [facility] for both [facility] and IV infusions. Referral given to [facility] who called referral into [facility] (Inf[facility]). TOC verified with [facility] they would be able to prepare medications for first home dose tomorrow. Pt will receive today's dose prior to DC.     Final next level of care: [place]     Patient Goals and CMS Choice Patient states their goals for this hospitalization and ongoing recovery are:: fully recover CMS Medicare.gov Compare Post Acute Care list provided to:: Patient Choice offered to / list presented to : Patient    HH Arranged: RN, IV Antibiotics HH Agency: [place] [facility]) Date [place]: 07/07/18 Time [place]: [id number] Representative spoke with at [place]: [place]   Readmission Risk Interventions Readmission Risk Prevention Plan 07/07/2018  Post Dischage Appt (No Data)  Medication Screening Complete  Transportation Screening Complete  Some recent data might be hidden

## 2018-07-07 NOTE — Plan of Care (Signed)

## 2018-07-08 DIAGNOSIS — Z452 Encounter for adjustment and management of vascular access device: Secondary | ICD-10-CM | POA: Diagnosis not present

## 2018-07-08 DIAGNOSIS — R7881 Bacteremia: Secondary | ICD-10-CM | POA: Diagnosis not present

## 2018-07-08 DIAGNOSIS — Z792 Long term (current) use of antibiotics: Secondary | ICD-10-CM | POA: Diagnosis not present

## 2018-07-08 DIAGNOSIS — L03115 Cellulitis of right lower limb: Secondary | ICD-10-CM | POA: Diagnosis not present

## 2018-07-08 DIAGNOSIS — B950 Streptococcus, group A, as the cause of diseases classified elsewhere: Secondary | ICD-10-CM | POA: Diagnosis not present

## 2018-07-08 DIAGNOSIS — I10 Essential (primary) hypertension: Secondary | ICD-10-CM | POA: Diagnosis not present

## 2018-07-08 DIAGNOSIS — E1151 Type 2 diabetes mellitus with diabetic peripheral angiopathy without gangrene: Secondary | ICD-10-CM | POA: Diagnosis not present

## 2018-07-08 DIAGNOSIS — K900 Celiac disease: Secondary | ICD-10-CM | POA: Diagnosis not present

## 2018-07-08 DIAGNOSIS — E1165 Type 2 diabetes mellitus with hyperglycemia: Secondary | ICD-10-CM | POA: Diagnosis not present

## 2018-07-08 DIAGNOSIS — K7581 Nonalcoholic steatohepatitis (NASH): Secondary | ICD-10-CM | POA: Diagnosis not present

## 2018-07-09 LAB — CULTURE, BLOOD (ROUTINE X 2)
Culture: NO GROWTH
Culture: NO GROWTH
Special Requests: ADEQUATE
Special Requests: ADEQUATE

## 2018-07-11 ENCOUNTER — Ambulatory Visit (INDEPENDENT_AMBULATORY_CARE_PROVIDER_SITE_OTHER): Payer: Medicare Other | Admitting: Family Medicine

## 2018-07-11 ENCOUNTER — Other Ambulatory Visit: Payer: Self-pay

## 2018-07-11 ENCOUNTER — Ambulatory Visit (HOSPITAL_COMMUNITY): Payer: Medicare Other | Admitting: Hematology

## 2018-07-11 VITALS — BP 162/80 | HR 84 | Temp 98.5°F | Resp 16 | Ht 72.0 in | Wt 250.0 lb

## 2018-07-11 DIAGNOSIS — R7881 Bacteremia: Secondary | ICD-10-CM

## 2018-07-11 DIAGNOSIS — E118 Type 2 diabetes mellitus with unspecified complications: Secondary | ICD-10-CM | POA: Diagnosis not present

## 2018-07-11 DIAGNOSIS — Z09 Encounter for follow-up examination after completed treatment for conditions other than malignant neoplasm: Secondary | ICD-10-CM | POA: Diagnosis not present

## 2018-07-11 DIAGNOSIS — I82451 Acute embolism and thrombosis of right peroneal vein: Secondary | ICD-10-CM | POA: Diagnosis not present

## 2018-07-11 DIAGNOSIS — K760 Fatty (change of) liver, not elsewhere classified: Secondary | ICD-10-CM

## 2018-07-11 DIAGNOSIS — R7309 Other abnormal glucose: Secondary | ICD-10-CM | POA: Diagnosis not present

## 2018-07-11 DIAGNOSIS — I10 Essential (primary) hypertension: Secondary | ICD-10-CM | POA: Diagnosis not present

## 2018-07-11 MED ORDER — CLOTRIMAZOLE-BETAMETHASONE 1-0.05 % EX CREA: 1.0000 "application " | TOPICAL_CREAM | Freq: Two times a day (BID) | CUTANEOUS | 0 refills | Status: DC

## 2018-07-11 NOTE — Progress Notes (Signed)
Subjective:    Patient ID: Joel Reed., male    DOB: January 30, 1951, 68 y.o.   MRN: [id number]    12/31/17 Stool cards did reveal blood.  Therefore we consulted GI.  Patient had an EGD performed on November 4 which showed gastritis and duodenitis although there was no active source of bleeding.  He also had a colonoscopy that revealed several nonbleeding angiodysplasias.  It also showed a simple polyp.    Dr. Hilarie Fredrickson performed a CBC which again showed pancytopenia with a white blood cell count of 2.2, hemoglobin remained just above 10, platelet count was slightly low.  Ferritin and iron levels are extremely low.  Percent saturation was also low suggesting iron deficiency anemia despite being on oral replacement.  Patient is here today for follow-up.  Patient has already received Feraheme.  He states that he feels much better after having received iron infusion.  He is also been scheduled to follow-up with heme-onc regarding his pancytopenia.  However on his exam today, the patient has a massively enlarged right lower extremity compared to the left lower extremity.  He states that this occurred at the end of last week.  He was in urgent care and was started on antibiotics for possible cellulitis, Keflex.  However the situation has not improved.  On exam, the right lower extremity distal to the knee is much larger than the left lower extremity.  He has +1 pitting edema in the right lower extremity.  It is warm to the touch.  It is slightly erythematous but does not appear to be cellulitic.  Instead it is inflamed and hot.  He also complains of pain posteriorly in his calf raising the concern for a DVT.  At that time, my plan was: First I am concerned about a DVT.  I believe the pretest probability is extremely high.  Therefore I will start the patient on Xarelto 15 mg p.o. twice daily given the late hour of this office visit and schedule him for an urgent ultrasound of the lower extremities to rule out  DVT in the morning.  If DVT is confirmed, patient will continue this medication and we will discuss long-term treatment option.  If ultrasound ruled out DVT, we may need to consider lymphedema versus atypical cellulitis.  Regarding his microcytic anemia, the patient has iron deficiency anemia and does not appear to absorb iron well from his diet.  He is improved dramatically after receiving an iron infusion.  However given his pancytopenia, I have recommended a consultation with hematology/oncology to rule out underlying bone marrow abnormalities.  This may be due to his cirrhosis however I believe we need to rule out myelodysplasia, etc.  Patient has already called and made this appointment and I have encouraged him to keep it.  Follow-up on Friday, await the results of the lower extremity DVT evaluation.   01/03/18 Venous ultrasound confirmed DVT:  Right: Findings consistent with age indeterminate deep vein thrombosis involving the right posterior tibial vein, and right peroneal vein.  Patient is currently on Xarelto 15 mg twice daily for 21 days.  He will then transition to Xarelto 20 mg a day.  He is here today to discuss.  I have several concerns.  First the patient was recently evaluated for a GI bleed.  Although no direct site of bleeding was identified his stool still guaiac positive.  He is now on powerful blood thinners for his DVT.  Therefore we need to monitor his CBC closely.  Recommended checking a CBC today and again in a week.  Second concern I have is the fact that this occurred seemingly without provocation.  The patient has not had any recent prolonged periods of immobilization or any recent surgeries.  This raises the concern for hypercoagulable state.  As mentioned in my previous office visit, the patient has pancytopenia and I have concern for possible myelodysplasia versus pancytopenia secondary to his underlying cirrhosis.  Patient is currently receiving iron infusions under the care  of his gastroenterologist.  I believe he would also benefit from seeing a hematologist to discuss the causes of his pancytopenia and to possibly evaluate for underlying bone marrow abnormalities that may cause this and put the patient at risk for hypercoagulable state to cause his DVT.  I spent more than 25 minutes today with the patient explaining this.  Also states that his gastroenterologist was concerned about possible celiac disease as a cause of his blood loss.  Therefore he is requesting that we check a celiac panel.  At that time, my plan was: I will consult heme-onc for evaluation of his pancytopenia and also to determine if the patient has a hypercoagulable state that triggered his DVT as this seems to be an unprovoked DVT.  Regarding his DVT, the patient will be treated with Xarelto 15 mg twice daily for 21 days and then transition to 20 mg once daily.  I sent him a prescription for the 20 mg once daily.  He has a starter pack of Xarelto to complete the first month.  I will check a CBC today to monitor for anemia and get a baseline hemoglobin.  I would like to recheck his CBC in 1 week to ensure that he is not losing blood on the Xarelto.  If he is losing significant amounts of blood we will need to discontinue the Xarelto and consider an IVC filter in his right leg.  More than 25 minutes was spent with the patient explaining the situation.  The patient comes back in 1 week to recheck his CBC I will also check a celiac panel.  07/11/18 Patient was recently admitted to the hospital.  I have copied relevant portions of the discharge summary and included them below for my reference: Admit date: 07/02/2018  Discharge date: 07/07/2018  Admitted From:Home  Disposition:  Home  Recommendations for Outpatient Follow-up:  1. Follow up with PCP in 1-2 weeks 2. Continue on IV Rocephin daily as scheduled through 08/14/2018 3. Follow-up with ID Dr. Drucilla Schmidt in early June which will be scheduled in the  outpatient setting 4. Patient will have repeat lab work weekly  Home Health: Yes with RN  Equipment/Devices: Home infusions have been set up  Discharge Condition: Stable  CODE STATUS: Full  Diet recommendation: Heart Healthy/carb modified  Brief/Interim Summary: Per HPI: CORT DRAGOO Jr.is a 68 y.o.malewith medical history significant ofnonalcoholic liver cirrhosis, celiac disease, diabetes mellitus type 2, hypertension came to the hospital with complaints of fever and fatigue. Patient states he was feeling well up until yesterday evening but this morning when he woke up he started having subjective fevers and chills with feeling of nausea. Denies any abdominal pain, shortness of breath, cough or any other symptoms. Denies any sick contacts. In the ER he was noted to have temperature of 101.1 which went up to 102.3. COVID-negative. Saturating greater than 90% on room air. Chest x-ray showed questionable infiltrate at the bases. Normal WBC. While in the ER he had one episode of  diarrhea but no more since. It was nonbloody. He was started on IV Rocephin and azithromycin admitted to the hospital.  Patient was admitted with fever, initially with unknown etiology, but it now appears that he has strep group G bacteremia. Hedenies any further symptomatology and has been without fever in the last48hours. Consulted with cardiology on 5/22 who stated that TEE currently appears unnecessary. Unfortunately, no source of infection is otherwise noted. Discussed with ID Dr. Drucilla Schmidt earlier this morning on 5/23 and increased dose of Rocephin for bacteremia coverage.  Repeat blood cultures have been negative since 5/22.  He has been noted to have some right lower extremity swelling for which repeat ultrasound was performed and was negative for DVT.  He follows up with Dr. Walden Field regarding his DVT management and is currently on Xarelto.  He does appear to have some mild findings of  cellulitis to that site which should improve with treatment on IV antibiotics as currently scheduled.  He will maintain leg elevation as well as compression stockings to assist with the swelling on that side.  He has received a PICC line to his right upper extremity on 5/24.  He has otherwise been set up with home health services and home infusions to continue for the next 6 weeks with IV Rocephin 2 g daily.  He will follow-up with Dr. Drucilla Schmidt in the outpatient setting for follow-up.  He will also have weekly lab work performed.  No other acute events noted during the course of this admission.  Discharge Diagnoses:  Principal Problem:   Fever Active Problems:   Type II diabetes mellitus, uncontrolled (HCC)   HTN (hypertension), benign   HLD (hyperlipidemia)   PVD (peripheral vascular disease) (HCC)   Gastritis and duodenitis   Celiac disease   Fatigue   NAFLD (nonalcoholic fatty liver disease)   Cirrhosis, non-alcoholic (HCC)   Bacteremia due to Gram-positive bacteria  Principal discharge diagnosis: Strep group G bacteremia.  Patient is here today for follow-up.  He is doing much better since starting Rocephin.  He denies any additional fevers.  He denies any nausea or vomiting.  He denies any systemic symptoms of illness.  He is actually back at work.  However he is concerned because of the uncertainty as to the portal of entry of his group G bacteremia.  Research I conducted prior to his visit today shows that 56% are skin and soft tissue infections, 10% or bone infections, 5% are cardiac source, 4% are GI sources, the rest remain idiopathic.  Patient denies any breaks in his skin or recent cellulitis.  I was previously treating the patient for a DVT which was diagnosed in November 2019.  While in the hospital they repeated a right leg ultrasound which showed resolution of the DVT however it also showed right inguinal lymphadenopathy.  Because of this I asked to examine that region today on  his exam.  The patient has severe intertrigo.  There is a serpiginous bright red rash in the right inguinal fold and underneath his pannus of his abdomen in the suprapubic area.  The skin there is cracked and irritated severely.  Patient has been applying hydrocortisone cream to this.  I believe that this is likely the most likely source of injury.  I believe the patient has chronic intertrigo and likely had secondary cellulitis that progressed to strep group B bacteremia.  Obviously this is a guess at this time as the cellulitis has improved.  However I believe this is the most  likely source.  His blood pressure today is 162/80.  He states that he checked it prior to arrival and it was 138/80 at home.  He states that is never elevated when he checks his blood pressure at home.  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   Sleep apnea    Tubular adenoma of colon    Past Surgical History:  Procedure Laterality Date   COLONOSCOPY     JOINT REPLACEMENT     left hip Dr Noemi Chapel   SPINE SURGERY     disectomy   Current Outpatient Medications on File Prior to Visit  Medication Sig Dispense Refill   amLODipine (NORVASC) 5 MG tablet TAKE 1 TABLET BY MOUTH EVERY DAY 90 tablet 2   cefTRIAXone (ROCEPHIN) IVPB Inject 2 g into the vein daily for 40 doses. Indication:  Streptococcal bacteremia           Last Day of Therapy:  08/14/2018 Labs - Once weekly:  CBC/D and BMP, Labs - Every other week:  ESR and CRP 40 Units 0   cholecalciferol (VITAMIN D3) 25 MCG (1000 UT) tablet Take  1,000 Units by mouth daily.     fluticasone (FLONASE) 50 MCG/ACT nasal spray Place 2 sprays into both nostrils daily. 16 g 2   glimepiride (AMARYL) 4 MG tablet Take 0.5 tablets (2 mg total) by mouth daily. 45 tablet 0   losartan (COZAAR) 100 MG tablet Take 1 tablet by mouth once daily 90 tablet 3   metFORMIN (GLUCOPHAGE) 500 MG tablet TAKE 1 TABLET BY MOUTH 2 TIMES DAILY WITH A MEAL. 180 tablet 0   metoprolol succinate (TOPROL-XL) 25 MG 24 hr tablet Take 1 tablet by mouth once daily 90 tablet 3   pantoprazole (PROTONIX) 40 MG tablet Take 1 tablet (40 mg total) by mouth daily. 90 tablet 3   vitamin B-12 (CYANOCOBALAMIN) 500 MCG tablet Take 500 mcg by mouth daily.     XARELTO 20 MG TABS tablet TAKE 1 TABLET BY MOUTH DAILY WITH SUPPER. 90 tablet 1   No current facility-administered medications on file prior to visit.    Allergies  Allergen Reactions   Gluten Meal    Social History   Socioeconomic History   Marital status: Married    Spouse name: Not on file   Number of children: Not on file   Years of education: Not on file   Highest education level: Not on file  Occupational History   Not on file  Social Needs   Financial resource strain: Not on file   Food insecurity:    Worry: Not on file    Inability: Not on file   Transportation needs:    Medical: Not on file    Non-medical: Not on file  Tobacco Use   Smoking status: Never Smoker   Smokeless tobacco: Never Used  Substance and Sexual Activity   Alcohol use: No   Drug use: No   Sexual activity: Not on file  Lifestyle  Physical activity:    Days per week: Not on file    Minutes per session: Not on file   Stress: Not on file  Relationships   Social connections:    Talks on phone: Not on file    Gets together: Not on file    Attends religious service: Not on file    Active member of club or organization: Not on file    Attends meetings of clubs or organizations: Not on file    Relationship  status: Not on file   Intimate partner violence:    Fear of current or ex partner: Not on file    Emotionally abused: Not on file    Physically abused: Not on file    Forced sexual activity: Not on file  Other Topics Concern   Not on file  Social History Narrative   Not on file      Review of Systems  All other systems reviewed and are negative.      Objective:   Physical Exam  Constitutional: He appears well-developed and well-nourished. No distress.  Neck: No JVD present.  Cardiovascular: Normal rate, regular rhythm and normal heart sounds. Exam reveals no gallop and no friction rub.  No murmur heard. Pulmonary/Chest: Effort normal and breath sounds normal. No respiratory distress. He has no wheezes. He has no rales.  Abdominal: Soft. Bowel sounds are normal. He exhibits no distension and no mass. There is no abdominal tenderness. There is no rebound and no guarding.  Musculoskeletal:        General: No edema.       Legs:  Skin: Rash noted. He is not diaphoretic. There is erythema.  Vitals reviewed.     Assessment & Plan:  Hospital discharge follow-up - Plan: CBC with Differential/Platelet, COMPLETE METABOLIC PANEL WITH GFR, Hemoglobin A1c  Bacteremia  HTN (hypertension), benign  NAFLD (nonalcoholic fatty liver disease)  Controlled type 2 diabetes mellitus with complication, without long-term current use of insulin (HCC)  Deep venous thrombosis (DVT) of right peroneal vein, unspecified chronicity  Patient has a follow-up visit to see infectious disease on June 16.  He will complete 6 weeks of Rocephin for group B strep bacteremia.  At this point my best guess as to the portal of entry would be secondary cellulitis of the intertrigo in his right inguinal crease.  This would also make sense with the right inguinal lymphadenopathy seen on his right leg venous ultrasound.  I will repeat a CBC and a CMP today.  While checking lab work I will also repeat hemoglobin  A1c to ensure adequate control of his diabetes.  His blood pressure remains significantly elevated.  I have recommended that he check it morning and night for the next week and email [place] the values that we can titrate blood pressure medication if necessary.  Patient is completed 6 months of therapy for the DVT and therefore I believe he can discontinue his Xarelto.  I will treat the intertrigo with Lotrisone cream twice daily for 10 to 14 days.  We also spent 10 minutes discussing ways to help prevent this in the future including weight loss, drying that area thoroughly after bathing with a hair dryer, applying liberal amounts of Goldbond powder as a desiccant, wearing loosefitting clothing to allow better air exchange in that area to keep the area dry.

## 2018-07-12 LAB — CBC WITH DIFFERENTIAL/PLATELET
Absolute Monocytes: 365 cells/uL (ref 200–950)
Basophils Absolute: 42 cells/uL (ref 0–200)
Basophils Relative: 1 %
Eosinophils Absolute: 92 cells/uL (ref 15–500)
Eosinophils Relative: 2.2 %
HCT: 41.5 % (ref 38.5–50.0)
Hemoglobin: 14.4 g/dL (ref 13.2–17.1)
Lymphs Abs: 1008 cells/uL (ref 850–3900)
MCH: 30.3 pg (ref 27.0–33.0)
MCHC: 34.7 g/dL (ref 32.0–36.0)
MCV: 87.4 fL (ref 80.0–100.0)
MPV: 10.9 fL (ref 7.5–12.5)
Monocytes Relative: 8.7 %
Neutro Abs: 2692 cells/uL (ref 1500–7800)
Neutrophils Relative %: 64.1 %
Platelets: 181 10*3/uL (ref 140–400)
RBC: 4.75 10*6/uL (ref 4.20–5.80)
RDW: 12.7 % (ref 11.0–15.0)
Total Lymphocyte: 24 %
WBC: 4.2 10*3/uL (ref 3.8–10.8)

## 2018-07-12 LAB — COMPLETE METABOLIC PANEL WITH GFR
AG Ratio: 1.1 (calc) (ref 1.0–2.5)
ALT: 65 U/L — ABNORMAL HIGH (ref 9–46)
AST: 47 U/L — ABNORMAL HIGH (ref 10–35)
Albumin: 4 g/dL (ref 3.6–5.1)
Alkaline phosphatase (APISO): 56 U/L (ref 35–144)
BUN: 13 mg/dL (ref 7–25)
CO2: 25 mmol/L (ref 20–32)
Calcium: 9 mg/dL (ref 8.6–10.3)
Chloride: 102 mmol/L (ref 98–110)
Creat: 0.89 mg/dL (ref 0.70–1.25)
GFR, Est African American: 104 mL/min/{1.73_m2} (ref >=60)
GFR, Est Non African American: 90 mL/min/{1.73_m2} (ref >=60)
Globulin: 3.5 g/dL (calc) (ref 1.9–3.7)
Glucose, Bld: 169 mg/dL — ABNORMAL HIGH (ref 65–99)
Potassium: 4.7 mmol/L (ref 3.5–5.3)
Sodium: 138 mmol/L (ref 135–146)
Total Bilirubin: 0.9 mg/dL (ref 0.2–1.2)
Total Protein: 7.5 g/dL (ref 6.1–8.1)

## 2018-07-12 LAB — HEMOGLOBIN A1C
Hgb A1c MFr Bld: 7.2 % of total Hgb — ABNORMAL HIGH (ref <5.7)
Mean Plasma Glucose: 160 (calc)
eAG (mmol/L): 8.9 (calc)

## 2018-07-14 ENCOUNTER — Other Ambulatory Visit: Payer: Self-pay | Admitting: Family Medicine

## 2018-07-14 ENCOUNTER — Telehealth: Payer: Self-pay | Admitting: Family Medicine

## 2018-07-14 DIAGNOSIS — R7881 Bacteremia: Secondary | ICD-10-CM | POA: Diagnosis not present

## 2018-07-14 DIAGNOSIS — I82451 Acute embolism and thrombosis of right peroneal vein: Secondary | ICD-10-CM

## 2018-07-14 DIAGNOSIS — Z792 Long term (current) use of antibiotics: Secondary | ICD-10-CM | POA: Diagnosis not present

## 2018-07-14 DIAGNOSIS — L03115 Cellulitis of right lower limb: Secondary | ICD-10-CM | POA: Diagnosis not present

## 2018-07-14 DIAGNOSIS — Z452 Encounter for adjustment and management of vascular access device: Secondary | ICD-10-CM | POA: Diagnosis not present

## 2018-07-14 DIAGNOSIS — E118 Type 2 diabetes mellitus with unspecified complications: Secondary | ICD-10-CM

## 2018-07-14 DIAGNOSIS — E1165 Type 2 diabetes mellitus with hyperglycemia: Secondary | ICD-10-CM | POA: Diagnosis not present

## 2018-07-14 DIAGNOSIS — K760 Fatty (change of) liver, not elsewhere classified: Secondary | ICD-10-CM

## 2018-07-14 DIAGNOSIS — B950 Streptococcus, group A, as the cause of diseases classified elsewhere: Secondary | ICD-10-CM | POA: Diagnosis not present

## 2018-07-14 MED ORDER — CLOTRIMAZOLE-BETAMETHASONE 1-0.05 % EX CREA: 1.0000 "application " | TOPICAL_CREAM | Freq: Two times a day (BID) | CUTANEOUS | 2 refills | Status: DC

## 2018-07-14 NOTE — Telephone Encounter (Signed)
Medication called/sent to requested pharmacy  

## 2018-07-14 NOTE — Telephone Encounter (Signed)
Pt needs [place] to change clotrimazole-betamethasone cream to [facility] , his insurance will not cover at [facility] anymore.

## 2018-07-15 ENCOUNTER — Other Ambulatory Visit: Payer: Self-pay | Admitting: Family Medicine

## 2018-07-17 ENCOUNTER — Other Ambulatory Visit: Payer: Self-pay | Admitting: Family Medicine

## 2018-07-17 MED ORDER — METFORMIN HCL 500 MG PO TABS: 750.0000 mg | ORAL_TABLET | Freq: Two times a day (BID) | ORAL | 2 refills | Status: DC

## 2018-07-21 ENCOUNTER — Telehealth: Payer: Self-pay | Admitting: Family Medicine

## 2018-07-21 DIAGNOSIS — B950 Streptococcus, group A, as the cause of diseases classified elsewhere: Secondary | ICD-10-CM | POA: Diagnosis not present

## 2018-07-21 DIAGNOSIS — L03115 Cellulitis of right lower limb: Secondary | ICD-10-CM | POA: Diagnosis not present

## 2018-07-21 DIAGNOSIS — R7881 Bacteremia: Secondary | ICD-10-CM | POA: Diagnosis not present

## 2018-07-21 DIAGNOSIS — E1165 Type 2 diabetes mellitus with hyperglycemia: Secondary | ICD-10-CM | POA: Diagnosis not present

## 2018-07-21 DIAGNOSIS — Z792 Long term (current) use of antibiotics: Secondary | ICD-10-CM | POA: Diagnosis not present

## 2018-07-21 DIAGNOSIS — Z452 Encounter for adjustment and management of vascular access device: Secondary | ICD-10-CM | POA: Diagnosis not present

## 2018-07-21 NOTE — Telephone Encounter (Signed)
Pt needs a work note to be released to go back to work - he does dose his medication at 3 pm and needs to leave by 2 pm to get home to do so. Also needs to be home on Mondays for hh to come out and change his dressing. He needs all this put in note.   Dr. Buelah Manis wanted him to increase his metformin to 750 mg but pt would like your opinion on that as he states she is not his doctor you are and he just would like your opinion on it.  His BP has been doing well at home - 130's/ 60-70's. He had one BP that was above 140.

## 2018-07-22 ENCOUNTER — Encounter: Payer: Self-pay | Admitting: Family Medicine

## 2018-07-22 NOTE — Telephone Encounter (Signed)
Pt aware of provider recommendations

## 2018-07-22 NOTE — Telephone Encounter (Signed)
I agree with increasing metformin.

## 2018-07-28 ENCOUNTER — Telehealth: Payer: Self-pay | Admitting: Infectious Disease

## 2018-07-28 DIAGNOSIS — L03115 Cellulitis of right lower limb: Secondary | ICD-10-CM | POA: Diagnosis not present

## 2018-07-28 DIAGNOSIS — E1165 Type 2 diabetes mellitus with hyperglycemia: Secondary | ICD-10-CM | POA: Diagnosis not present

## 2018-07-28 DIAGNOSIS — B950 Streptococcus, group A, as the cause of diseases classified elsewhere: Secondary | ICD-10-CM | POA: Diagnosis not present

## 2018-07-28 DIAGNOSIS — Z452 Encounter for adjustment and management of vascular access device: Secondary | ICD-10-CM | POA: Diagnosis not present

## 2018-07-28 DIAGNOSIS — Z792 Long term (current) use of antibiotics: Secondary | ICD-10-CM | POA: Diagnosis not present

## 2018-07-28 DIAGNOSIS — R7881 Bacteremia: Secondary | ICD-10-CM | POA: Diagnosis not present

## 2018-07-28 DIAGNOSIS — Z5181 Encounter for therapeutic drug level monitoring: Secondary | ICD-10-CM | POA: Diagnosis not present

## 2018-07-28 NOTE — Telephone Encounter (Signed)
[id number] Pre-Screening Questions: 07/28/18  Do you currently have a fever (>100 F), chills or unexplained body aches? NO  Are you currently experiencing new cough, shortness of breath, sore throat, runny nose? NO   Have you recently travelled outside the state of [place] in the last 14 days? NO   Have you been in contact with someone that is currently pending confirmation of Covid19 testing or has been confirmed to have the [place] virus?  NO  **If the patient answers NO to ALL questions -  advise the patient to please call the clinic before coming to the office should any symptoms develop.

## 2018-07-29 ENCOUNTER — Encounter: Payer: Self-pay | Admitting: Infectious Disease

## 2018-07-29 ENCOUNTER — Other Ambulatory Visit: Payer: Self-pay

## 2018-07-29 ENCOUNTER — Ambulatory Visit (INDEPENDENT_AMBULATORY_CARE_PROVIDER_SITE_OTHER): Payer: Medicare Other | Admitting: Infectious Disease

## 2018-07-29 VITALS — BP 157/78 | HR 81 | Temp 97.9°F | Ht 72.0 in | Wt 253.0 lb

## 2018-07-29 DIAGNOSIS — K760 Fatty (change of) liver, not elsewhere classified: Secondary | ICD-10-CM | POA: Diagnosis not present

## 2018-07-29 DIAGNOSIS — R7881 Bacteremia: Secondary | ICD-10-CM | POA: Diagnosis not present

## 2018-07-29 DIAGNOSIS — K7460 Unspecified cirrhosis of liver: Secondary | ICD-10-CM | POA: Diagnosis not present

## 2018-07-29 DIAGNOSIS — M7918 Myalgia, other site: Secondary | ICD-10-CM | POA: Diagnosis not present

## 2018-07-29 HISTORY — DX: Myalgia, other site: M79.18

## 2018-07-29 NOTE — Progress Notes (Signed)
Subjective:   Chief complaint: Right buttock and back pain   Patient ID: Joel Gums., male    DOB: February 24, 1950, 68 y.o.   MRN: [id number]  HPI   68 y.o. male malewith medical history significant ofnonalcoholic liver cirrhosis, celiac disease, diabetes mellitus type 2, hypertension came to the hospital with complaints of fever and fatigue. Patient states he was feeling well up until yesterday evening but this morning when he woke up he started having subjective fevers and chills with feeling of nausea, IN ER fevrile to over 102.3. CoviD negative. CXR with ? Infiltrate. He has since grown group G streptococci. There is no clear source from talking with the primary team. TTE negative  While this could ber just a transietn bacteremia in patient with cirrhosis I went go ahead and rx with 6 weeks of IV ceftriaxone.   Since he has been discharged from the hospital his primary care physician had noted that the patient did have an exfoliative erythematous dermatitis in his abdomen that he thinks might of been a portal of entry and the patient also had an enlarged inguinal lymph node.  Certainly this may have been the source.  I was contemplating shortening his antibiotic therapy but on review of systems today he did endorse back pain and right buttocks pain which makes me concerned for potential deeper infection.   Past Medical History:  Diagnosis Date  . Allergy   . Celiac disease   . Cirrhosis ([place])   . Diabetes mellitus without complication ([place])   . Gastritis and duodenitis    on egd 12/2017  . Hyperlipidemia    Patient denies  . Hypertension   . IDA (iron deficiency anemia)   . Internal hemorrhoids   . Nephrolithiasis   . 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  . Right buttock pain 07/29/2018  . Sleep apnea   . Tubular adenoma of colon     Past Surgical History:  Procedure Laterality Date  . COLONOSCOPY    . JOINT REPLACEMENT     left hip Dr Noemi Chapel  . SPINE SURGERY     disectomy    Family History  Problem Relation Age of Onset  . Esophageal cancer Father   . Breast cancer Sister        bone cancer  . Heart attack Brother 31  . Cancer - Other Sister        cervical?  . Colon cancer Neg Hx   . Rectal cancer Neg Hx   . Stomach cancer Neg Hx       Social History   Socioeconomic History  . Marital status: Married    Spouse name: Not on file  . Number of children: Not on file  . Years of education: Not on file  . Highest education level: Not on file  Occupational History  . Not on file  Social Needs  . Financial resource strain: Not on file  . Food insecurity    Worry: Not on file    Inability: Not on file  . Transportation needs    Medical: Not on file    Non-medical: Not on file  Tobacco Use  . Smoking status: Never Smoker  . Smokeless tobacco: Never Used  Substance and Sexual Activity  . Alcohol use: No  . Drug use: No  . Sexual activity: Not on file  Lifestyle  . Physical activity    Days per week: Not on file  Minutes per session: Not on file  . Stress: Not on file  Relationships  . Social Herbalist on phone: Not on file    Gets together: Not on file    Attends religious service: Not on file    Active member of club or organization: Not on file    Attends meetings of clubs or organizations: Not on file    Relationship status: Not on file  Other Topics Concern  . Not on file  Social History Narrative  . Not on file    Allergies  Allergen Reactions  . Gluten Meal      Current Outpatient Medications:  .  amLODipine (NORVASC) 5 MG tablet, Take 1 tablet by mouth once daily, Disp: 90 tablet, Rfl: 3 .  cefTRIAXone (ROCEPHIN) IVPB, Inject 2 g into the vein daily for 40 doses. Indication:  Streptococcal bacteremia           Last Day of Therapy:  08/14/2018 Labs - Once weekly:  CBC/D and BMP, Labs - Every other week:  ESR and CRP, Disp: 40 Units, Rfl: 0 .   cholecalciferol (VITAMIN D3) 25 MCG (1000 UT) tablet, Take 1,000 Units by mouth daily., Disp: , Rfl:  .  clotrimazole-betamethasone (LOTRISONE) cream, Apply 1 application topically 2 (two) times daily., Disp: 60 g, Rfl: 2 .  fluticasone (FLONASE) 50 MCG/ACT nasal spray, Place 2 sprays into both nostrils daily., Disp: 16 g, Rfl: 2 .  glimepiride (AMARYL) 4 MG tablet, Take 0.5 tablets (2 mg total) by mouth daily., Disp: 45 tablet, Rfl: 0 .  losartan (COZAAR) 100 MG tablet, Take 1 tablet by mouth once daily, Disp: 90 tablet, Rfl: 3 .  metFORMIN (GLUCOPHAGE) 500 MG tablet, Take 1.5 tablets (750 mg total) by mouth 2 (two) times daily with a meal., Disp: 270 tablet, Rfl: 2 .  metoprolol succinate (TOPROL-XL) 25 MG 24 hr tablet, Take 1 tablet by mouth once daily, Disp: 90 tablet, Rfl: 3 .  pantoprazole (PROTONIX) 40 MG tablet, Take 1 tablet (40 mg total) by mouth daily., Disp: 90 tablet, Rfl: 3 .  vitamin B-12 (CYANOCOBALAMIN) 500 MCG tablet, Take 500 mcg by mouth daily., Disp: , Rfl:  .  XARELTO 20 MG TABS tablet, TAKE 1 TABLET BY MOUTH DAILY WITH SUPPER. (Patient not taking: Reported on 07/29/2018), Disp: 90 tablet, Rfl: 1   Review of Systems  Constitutional: Negative for activity change, appetite change, chills, diaphoresis, fatigue, fever and unexpected weight change.  HENT: Negative for congestion, rhinorrhea, sinus pressure, sneezing, sore throat and trouble swallowing.   Eyes: Negative for photophobia and visual disturbance.  Respiratory: Negative for cough, chest tightness, shortness of breath, wheezing and stridor.   Cardiovascular: Negative for chest pain, palpitations and leg swelling.  Gastrointestinal: Negative for abdominal distention, abdominal pain, anal bleeding, blood in stool, constipation, diarrhea, nausea and vomiting.  Genitourinary: Negative for difficulty urinating, dysuria, flank pain and hematuria.  Musculoskeletal: Positive for back pain. Negative for arthralgias, gait problem,  joint swelling and myalgias.  Skin: Negative for color change, pallor, rash and wound.  Neurological: Negative for dizziness, tremors, weakness and light-headedness.  Hematological: Negative for adenopathy. Does not bruise/bleed easily.  Psychiatric/Behavioral: Negative for agitation, behavioral problems, confusion, decreased concentration, dysphoric mood and sleep disturbance.       Objective:   Physical Exam Constitutional:      General: He is not in acute distress.    Appearance: He is not diaphoretic.  HENT:     Head: Normocephalic and  atraumatic.     Right Ear: External ear normal.     Left Ear: External ear normal.     Nose: Nose normal.     Mouth/Throat:     Pharynx: No oropharyngeal exudate.  Eyes:     General: No scleral icterus.    Conjunctiva/sclera: Conjunctivae normal.     Pupils: Pupils are equal, round, and reactive to light.  Neck:     Musculoskeletal: Normal range of motion and neck supple.  Cardiovascular:     Rate and Rhythm: Normal rate and regular rhythm.     Heart sounds: Normal heart sounds. No murmur. No friction rub.  Pulmonary:     Effort: Pulmonary effort is normal. No respiratory distress.     Breath sounds: Normal breath sounds. No stridor. No wheezing, rhonchi or rales.  Abdominal:     General: Abdomen is flat. Bowel sounds are normal. There is no distension.     Palpations: Abdomen is soft.     Tenderness: There is no abdominal tenderness. There is no rebound.  Musculoskeletal: Normal range of motion.        General: No tenderness.  Lymphadenopathy:     Cervical: No cervical adenopathy.  Skin:    General: Skin is warm and dry.     Coloration: Skin is not pale.     Findings: No erythema or rash.  Neurological:     Mental Status: He is alert and oriented to person, place, and time.     Coordination: Coordination normal.  Psychiatric:        Judgment: Judgment normal.    Skin along abdomen:     PICC line           Assessment  & Plan:   Group G streptococcal bacteremia initially thought to not have a source but now potentially may have been due to infection of the skin along his abdomen.  I was going to consider shortening his duration of therapy but he has worsening lower back and right buttocks pain was made and concern for potential for deep infection in the spine.  We will continue antimicrobial therapy and continue to monitor him.  I spent greater than 25 minutes with the patient including greater than 50% of time in face to face counsel of the patient regarding the nature of bloodstream infections and the difference between ones that have isolated source versus ones that have developed a deep focus of infection and in coordination of his care.

## 2018-08-04 DIAGNOSIS — Z792 Long term (current) use of antibiotics: Secondary | ICD-10-CM | POA: Diagnosis not present

## 2018-08-04 DIAGNOSIS — L03115 Cellulitis of right lower limb: Secondary | ICD-10-CM | POA: Diagnosis not present

## 2018-08-04 DIAGNOSIS — R7881 Bacteremia: Secondary | ICD-10-CM | POA: Diagnosis not present

## 2018-08-04 DIAGNOSIS — B950 Streptococcus, group A, as the cause of diseases classified elsewhere: Secondary | ICD-10-CM | POA: Diagnosis not present

## 2018-08-04 DIAGNOSIS — Z452 Encounter for adjustment and management of vascular access device: Secondary | ICD-10-CM | POA: Diagnosis not present

## 2018-08-04 DIAGNOSIS — E1165 Type 2 diabetes mellitus with hyperglycemia: Secondary | ICD-10-CM | POA: Diagnosis not present

## 2018-08-07 DIAGNOSIS — E1165 Type 2 diabetes mellitus with hyperglycemia: Secondary | ICD-10-CM | POA: Diagnosis not present

## 2018-08-07 DIAGNOSIS — R7881 Bacteremia: Secondary | ICD-10-CM | POA: Diagnosis not present

## 2018-08-07 DIAGNOSIS — Z452 Encounter for adjustment and management of vascular access device: Secondary | ICD-10-CM | POA: Diagnosis not present

## 2018-08-07 DIAGNOSIS — K900 Celiac disease: Secondary | ICD-10-CM | POA: Diagnosis not present

## 2018-08-07 DIAGNOSIS — B950 Streptococcus, group A, as the cause of diseases classified elsewhere: Secondary | ICD-10-CM | POA: Diagnosis not present

## 2018-08-07 DIAGNOSIS — I10 Essential (primary) hypertension: Secondary | ICD-10-CM | POA: Diagnosis not present

## 2018-08-07 DIAGNOSIS — E1151 Type 2 diabetes mellitus with diabetic peripheral angiopathy without gangrene: Secondary | ICD-10-CM | POA: Diagnosis not present

## 2018-08-07 DIAGNOSIS — K7581 Nonalcoholic steatohepatitis (NASH): Secondary | ICD-10-CM | POA: Diagnosis not present

## 2018-08-07 DIAGNOSIS — L03115 Cellulitis of right lower limb: Secondary | ICD-10-CM | POA: Diagnosis not present

## 2018-08-07 DIAGNOSIS — Z792 Long term (current) use of antibiotics: Secondary | ICD-10-CM | POA: Diagnosis not present

## 2018-08-11 ENCOUNTER — Telehealth: Payer: Self-pay | Admitting: Infectious Disease

## 2018-08-11 DIAGNOSIS — R7881 Bacteremia: Secondary | ICD-10-CM | POA: Diagnosis not present

## 2018-08-11 DIAGNOSIS — Z792 Long term (current) use of antibiotics: Secondary | ICD-10-CM | POA: Diagnosis not present

## 2018-08-11 DIAGNOSIS — Z452 Encounter for adjustment and management of vascular access device: Secondary | ICD-10-CM | POA: Diagnosis not present

## 2018-08-11 DIAGNOSIS — B950 Streptococcus, group A, as the cause of diseases classified elsewhere: Secondary | ICD-10-CM | POA: Diagnosis not present

## 2018-08-11 DIAGNOSIS — E1165 Type 2 diabetes mellitus with hyperglycemia: Secondary | ICD-10-CM | POA: Diagnosis not present

## 2018-08-11 DIAGNOSIS — L03115 Cellulitis of right lower limb: Secondary | ICD-10-CM | POA: Diagnosis not present

## 2018-08-11 NOTE — Telephone Encounter (Signed)
[id number] Pre-Screening Questions: 08/11/18  Do you currently have a fever (>100 F), chills or unexplained body aches? NO  Are you currently experiencing new cough, shortness of breath, sore throat, runny nose?NO    Have you recently travelled outside the state of [place] in the last 14 days?NO   Have you been in contact with someone that is currently pending confirmation of Covid19 testing or has been confirmed to have the [place] virus?  NO  **If the patient answers NO to ALL questions -  advise the patient to please call the clinic before coming to the office should any symptoms develop.

## 2018-08-12 ENCOUNTER — Ambulatory Visit (INDEPENDENT_AMBULATORY_CARE_PROVIDER_SITE_OTHER): Payer: Medicare Other | Admitting: Infectious Disease

## 2018-08-12 ENCOUNTER — Other Ambulatory Visit: Payer: Self-pay

## 2018-08-12 ENCOUNTER — Encounter: Payer: Self-pay | Admitting: Infectious Disease

## 2018-08-12 VITALS — BP 163/78 | HR 84 | Temp 98.0°F | Ht 72.0 in | Wt 257.0 lb

## 2018-08-12 DIAGNOSIS — M7918 Myalgia, other site: Secondary | ICD-10-CM | POA: Diagnosis not present

## 2018-08-12 DIAGNOSIS — R7881 Bacteremia: Secondary | ICD-10-CM | POA: Diagnosis not present

## 2018-08-12 DIAGNOSIS — L539 Erythematous condition, unspecified: Secondary | ICD-10-CM

## 2018-08-12 DIAGNOSIS — K7460 Unspecified cirrhosis of liver: Secondary | ICD-10-CM

## 2018-08-12 NOTE — Progress Notes (Signed)
Verbal order per Dr. Tommy Medal for [facility] to remove Picc Line at completion of IV antibiotic therapy. End date on 08/14/18. LPN called [facility] and gave verbal orders to [facility]. Verbal order read back and understood.  Eugenia Mcalpine, LPN

## 2018-08-12 NOTE — Progress Notes (Signed)
Subjective:   Chief complaint: Right buttock and back pain   Patient ID: Joel Reed., male    DOB: [birth year], 68 y.o.   MRN: [id number]  HPI   68 y.o. male malewith medical history significant ofnonalcoholic liver cirrhosis, celiac disease, diabetes mellitus type 2, hypertension came to the hospital with complaints of fever and fatigue. Patient states he was feeling well up until yesterday evening but this morning when he woke up he started having subjective fevers and chills with feeling of nausea, IN ER fevrile to over 102.3. CoviD negative. CXR with ? Infiltrate. He has since grown group G streptococci. There is no clear source from talking with the primary team. TTE negative  While this could ber just a transietn bacteremia in patient with cirrhosis I went go ahead and rx with 6 weeks of IV ceftriaxone.   Since he has been discharged from the hospital his primary care physician had noted that the patient did have an exfoliative erythematous dermatitis in his abdomen that he thinks might of been a portal of entry and the patient also had an enlarged inguinal lymph node.  Certainly this may have been the source.  I was contemplating shortening his antibiotic therapy but on review of systemsat last visit he did  endorse back pain and right buttocks pain which makes me concerned for potential deeper infection.  Since then however his back and buttocks pain has completely resolved  He has not other complaints other than one skin lesion on left lower extremity.  He was going to go back to work but has not been able to since he has a PICC line. He also had a coworker who tested + for COVID but was allowed back to work after only 5 days away which is not proper protocol  He Financial trader.   Past Medical History:  Diagnosis Date  . Allergy   . Celiac disease   . Cirrhosis ([place])   . Diabetes mellitus without complication ([place])   . Gastritis and duodenitis    on egd 12/2017  . Hyperlipidemia    Patient denies  . Hypertension   . IDA (iron deficiency anemia)   . Internal hemorrhoids   . Nephrolithiasis   . 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  . Right buttock pain 07/29/2018  . Sleep apnea   . Tubular adenoma of colon     Past Surgical History:  Procedure Laterality Date  . COLONOSCOPY    . JOINT REPLACEMENT     left hip Dr Noemi Chapel  . SPINE SURGERY     disectomy    Family History  Problem Relation Age of Onset  . Esophageal cancer Father   . Breast cancer Sister        bone cancer  . Heart attack Brother 25  . Cancer - Other Sister        cervical?  . Colon cancer Neg Hx   . Rectal cancer Neg Hx   . Stomach cancer Neg Hx       Social History   Socioeconomic History  . Marital status: Married    Spouse name: Not on file  . Number of children: Not on file  . Years of education: Not on file  . Highest education level: Not on file  Occupational History  . Not on file  Social Needs  . Financial resource strain: Not on file  . Food insecurity  Worry: Not on file    Inability: Not on file  . Transportation needs    Medical: Not on file    Non-medical: Not on file  Tobacco Use  . Smoking status: Never Smoker  . Smokeless tobacco: Never Used  Substance and Sexual Activity  . Alcohol use: No  . Drug use: No  . Sexual activity: Not on file  Lifestyle  . Physical activity    Days per week: Not on file    Minutes per session: Not on file  . Stress: Not on file  Relationships  . Social Herbalist on phone: Not on file    Gets together: Not on file    Attends religious service: Not on file    Active member of club or organization: Not on file    Attends meetings of clubs or organizations: Not on file    Relationship status: Not on file  Other Topics Concern  . Not on file  Social History Narrative  . Not on file    Allergies  Allergen Reactions  . Gluten Meal       Current Outpatient Medications:  .  amLODipine (NORVASC) 5 MG tablet, Take 1 tablet by mouth once daily, Disp: 90 tablet, Rfl: 3 .  cefTRIAXone (ROCEPHIN) IVPB, Inject 2 g into the vein daily for 40 doses. Indication:  Streptococcal bacteremia           Last Day of Therapy:  08/14/2018 Labs - Once weekly:  CBC/D and BMP, Labs - Every other week:  ESR and CRP, Disp: 40 Units, Rfl: 0 .  cholecalciferol (VITAMIN D3) 25 MCG (1000 UT) tablet, Take 1,000 Units by mouth daily., Disp: , Rfl:  .  clotrimazole-betamethasone (LOTRISONE) cream, Apply 1 application topically 2 (two) times daily., Disp: 60 g, Rfl: 2 .  fluticasone (FLONASE) 50 MCG/ACT nasal spray, Place 2 sprays into both nostrils daily., Disp: 16 g, Rfl: 2 .  glimepiride (AMARYL) 4 MG tablet, Take 0.5 tablets (2 mg total) by mouth daily., Disp: 45 tablet, Rfl: 0 .  losartan (COZAAR) 100 MG tablet, Take 1 tablet by mouth once daily, Disp: 90 tablet, Rfl: 3 .  metFORMIN (GLUCOPHAGE) 500 MG tablet, Take 1.5 tablets (750 mg total) by mouth 2 (two) times daily with a meal., Disp: 270 tablet, Rfl: 2 .  metoprolol succinate (TOPROL-XL) 25 MG 24 hr tablet, Take 1 tablet by mouth once daily, Disp: 90 tablet, Rfl: 3 .  pantoprazole (PROTONIX) 40 MG tablet, Take 1 tablet (40 mg total) by mouth daily., Disp: 90 tablet, Rfl: 3 .  vitamin B-12 (CYANOCOBALAMIN) 500 MCG tablet, Take 500 mcg by mouth daily., Disp: , Rfl:  .  XARELTO 20 MG TABS tablet, TAKE 1 TABLET BY MOUTH DAILY WITH SUPPER. (Patient not taking: Reported on 07/29/2018), Disp: 90 tablet, Rfl: 1   Review of Systems  Constitutional: Negative for activity change, appetite change, chills, diaphoresis, fatigue, fever and unexpected weight change.  HENT: Negative for congestion, rhinorrhea, sinus pressure, sneezing, sore throat and trouble swallowing.   Eyes: Negative for photophobia and visual disturbance.  Respiratory: Negative for cough, chest tightness, shortness of breath, wheezing and  stridor.   Cardiovascular: Negative for chest pain, palpitations and leg swelling.  Gastrointestinal: Negative for abdominal distention, abdominal pain, anal bleeding, blood in stool, constipation, diarrhea, nausea and vomiting.  Genitourinary: Negative for difficulty urinating, dysuria, flank pain and hematuria.  Musculoskeletal: Negative for arthralgias, back pain, gait problem, joint swelling and myalgias.  Skin:  Negative for color change, pallor, rash and wound.  Neurological: Negative for dizziness, tremors, weakness and light-headedness.  Hematological: Negative for adenopathy. Does not bruise/bleed easily.  Psychiatric/Behavioral: Negative for agitation, behavioral problems, confusion, decreased concentration, dysphoric mood and sleep disturbance.       Objective:   Physical Exam Constitutional:      General: He is not in acute distress.    Appearance: He is not diaphoretic.  HENT:     Head: Normocephalic and atraumatic.     Right Ear: External ear normal.     Left Ear: External ear normal.     Nose: Nose normal.     Mouth/Throat:     Pharynx: No oropharyngeal exudate.  Eyes:     General: No scleral icterus.    Conjunctiva/sclera: Conjunctivae normal.  Neck:     Musculoskeletal: Normal range of motion and neck supple.  Cardiovascular:     Rate and Rhythm: Normal rate and regular rhythm.     Heart sounds: Normal heart sounds. No murmur. No friction rub. No gallop.   Pulmonary:     Effort: Pulmonary effort is normal. No respiratory distress.     Breath sounds: Normal breath sounds. No stridor. No wheezing, rhonchi or rales.  Abdominal:     General: Abdomen is flat. Bowel sounds are normal. There is no distension.     Palpations: Abdomen is soft.     Tenderness: There is no abdominal tenderness. There is no rebound.  Musculoskeletal: Normal range of motion.        General: No tenderness.  Lymphadenopathy:     Cervical: No cervical adenopathy.  Skin:    General: Skin is  warm and dry.     Coloration: Skin is not pale.     Findings: No erythema or rash.  Neurological:     Mental Status: He is alert and oriented to person, place, and time.     Coordination: Coordination normal.  Psychiatric:        Mood and Affect: Mood normal.        Behavior: Behavior normal.        Thought Content: Thought content normal.        Judgment: Judgment normal.    Skin along abdomen:  No sig erythema       Assessment & Plan:   Group G streptococcal bacteremia initially thought to not have a source but now potentially may have been due to infection of the skin along his abdomen.  I was going to consider shortening his duration of therapy but he has worsening lower back and right buttocks pain was made and concern for potential for deep infection in the spine. This pain is now gone  I will let him finish his antibiotics on July 2nd and he came come back to clinic PRN   I spent greater than 25 minutes with the patient including greater than 50% of time in face to face counsel of the patient re means of avoiding novel CoVID 19 given his multiple risk factors, means by which we treat bloodstream infectionsand in coordination of his care.

## 2018-08-14 DIAGNOSIS — B950 Streptococcus, group A, as the cause of diseases classified elsewhere: Secondary | ICD-10-CM | POA: Diagnosis not present

## 2018-08-14 DIAGNOSIS — Z792 Long term (current) use of antibiotics: Secondary | ICD-10-CM | POA: Diagnosis not present

## 2018-08-14 DIAGNOSIS — L03115 Cellulitis of right lower limb: Secondary | ICD-10-CM | POA: Diagnosis not present

## 2018-08-14 DIAGNOSIS — R7881 Bacteremia: Secondary | ICD-10-CM | POA: Diagnosis not present

## 2018-08-14 DIAGNOSIS — E1165 Type 2 diabetes mellitus with hyperglycemia: Secondary | ICD-10-CM | POA: Diagnosis not present

## 2018-08-14 DIAGNOSIS — Z452 Encounter for adjustment and management of vascular access device: Secondary | ICD-10-CM | POA: Diagnosis not present

## 2018-08-22 ENCOUNTER — Other Ambulatory Visit: Payer: Self-pay

## 2018-08-22 DIAGNOSIS — K760 Fatty (change of) liver, not elsewhere classified: Secondary | ICD-10-CM

## 2018-08-25 ENCOUNTER — Other Ambulatory Visit: Payer: Self-pay

## 2018-08-25 ENCOUNTER — Ambulatory Visit (INDEPENDENT_AMBULATORY_CARE_PROVIDER_SITE_OTHER): Payer: Medicare Other | Admitting: Family Medicine

## 2018-08-25 ENCOUNTER — Encounter: Payer: Self-pay | Admitting: Family Medicine

## 2018-08-25 VITALS — BP 140/60 | HR 88 | Temp 98.3°F | Resp 16 | Ht 72.0 in | Wt 260.0 lb

## 2018-08-25 DIAGNOSIS — K760 Fatty (change of) liver, not elsewhere classified: Secondary | ICD-10-CM

## 2018-08-25 DIAGNOSIS — R7881 Bacteremia: Secondary | ICD-10-CM

## 2018-08-25 DIAGNOSIS — E118 Type 2 diabetes mellitus with unspecified complications: Secondary | ICD-10-CM

## 2018-08-25 DIAGNOSIS — I82451 Acute embolism and thrombosis of right peroneal vein: Secondary | ICD-10-CM

## 2018-08-25 LAB — CBC WITH DIFFERENTIAL/PLATELET
Absolute Monocytes: 436 cells/uL (ref 200–950)
Basophils Absolute: 20 cells/uL (ref 0–200)
Basophils Relative: 0.5 %
Eosinophils Absolute: 108 cells/uL (ref 15–500)
Eosinophils Relative: 2.7 %
HCT: 40.9 % (ref 38.5–50.0)
Hemoglobin: 13.9 g/dL (ref 13.2–17.1)
Lymphs Abs: 1100 cells/uL (ref 850–3900)
MCH: 30.5 pg (ref 27.0–33.0)
MCHC: 34 g/dL (ref 32.0–36.0)
MCV: 89.9 fL (ref 80.0–100.0)
MPV: 12.1 fL (ref 7.5–12.5)
Monocytes Relative: 10.9 %
Neutro Abs: 2336 cells/uL (ref 1500–7800)
Neutrophils Relative %: 58.4 %
Platelets: 110 10*3/uL — ABNORMAL LOW (ref 140–400)
RBC: 4.55 10*6/uL (ref 4.20–5.80)
RDW: 13.2 % (ref 11.0–15.0)
Total Lymphocyte: 27.5 %
WBC: 4 10*3/uL (ref 3.8–10.8)

## 2018-08-25 MED ORDER — METFORMIN HCL 1000 MG PO TABS: 1000.0000 mg | ORAL_TABLET | Freq: Two times a day (BID) | ORAL | 3 refills | Status: DC

## 2018-08-25 NOTE — Progress Notes (Signed)
Subjective:    Patient ID: Joel Gums., male    DOB: [birth year], 68 y.o.   MRN: [id number]    12/31/17 Stool cards did reveal blood.  Therefore we consulted GI.  Patient had an EGD performed on November 4 which showed gastritis and duodenitis although there was no active source of bleeding.  He also had a colonoscopy that revealed several nonbleeding angiodysplasias.  It also showed a simple polyp.    Dr. Hilarie Fredrickson performed a CBC which again showed pancytopenia with a white blood cell count of 2.2, hemoglobin remained just above 10, platelet count was slightly low.  Ferritin and iron levels are extremely low.  Percent saturation was also low suggesting iron deficiency anemia despite being on oral replacement.  Patient is here today for follow-up.  Patient has already received Feraheme.  He states that he feels much better after having received iron infusion.  He is also been scheduled to follow-up with heme-onc regarding his pancytopenia.  However on his exam today, the patient has a massively enlarged right lower extremity compared to the left lower extremity.  He states that this occurred at the end of last week.  He was in urgent care and was started on antibiotics for possible cellulitis, Keflex.  However the situation has not improved.  On exam, the right lower extremity distal to the knee is much larger than the left lower extremity.  He has +1 pitting edema in the right lower extremity.  It is warm to the touch.  It is slightly erythematous but does not appear to be cellulitic.  Instead it is inflamed and hot.  He also complains of pain posteriorly in his calf raising the concern for a DVT.  At that time, my plan was: First I am concerned about a DVT.  I believe the pretest probability is extremely high.  Therefore I will start the patient on Xarelto 15 mg p.o. twice daily given the late hour of this office visit and schedule him for an urgent ultrasound of the lower extremities to rule out  DVT in the morning.  If DVT is confirmed, patient will continue this medication and we will discuss long-term treatment option.  If ultrasound ruled out DVT, we may need to consider lymphedema versus atypical cellulitis.  Regarding his microcytic anemia, the patient has iron deficiency anemia and does not appear to absorb iron well from his diet.  He is improved dramatically after receiving an iron infusion.  However given his pancytopenia, I have recommended a consultation with hematology/oncology to rule out underlying bone marrow abnormalities.  This may be due to his cirrhosis however I believe we need to rule out myelodysplasia, etc.  Patient has already called and made this appointment and I have encouraged him to keep it.  Follow-up on Friday, await the results of the lower extremity DVT evaluation.   01/03/18 Venous ultrasound confirmed DVT:  Right: Findings consistent with age indeterminate deep vein thrombosis involving the right posterior tibial vein, and right peroneal vein.  Patient is currently on Xarelto 15 mg twice daily for 21 days.  He will then transition to Xarelto 20 mg a day.  He is here today to discuss.  I have several concerns.  First the patient was recently evaluated for a GI bleed.  Although no direct site of bleeding was identified his stool still guaiac positive.  He is now on powerful blood thinners for his DVT.  Therefore we need to monitor his CBC closely.  Recommended checking a CBC today and again in a week.  Second concern I have is the fact that this occurred seemingly without provocation.  The patient has not had any recent prolonged periods of immobilization or any recent surgeries.  This raises the concern for hypercoagulable state.  As mentioned in my previous office visit, the patient has pancytopenia and I have concern for possible myelodysplasia versus pancytopenia secondary to his underlying cirrhosis.  Patient is currently receiving iron infusions under the care  of his gastroenterologist.  I believe he would also benefit from seeing a hematologist to discuss the causes of his pancytopenia and to possibly evaluate for underlying bone marrow abnormalities that may cause this and put the patient at risk for hypercoagulable state to cause his DVT.  I spent more than 25 minutes today with the patient explaining this.  Also states that his gastroenterologist was concerned about possible celiac disease as a cause of his blood loss.  Therefore he is requesting that we check a celiac panel.  At that time, my plan was: I will consult heme-onc for evaluation of his pancytopenia and also to determine if the patient has a hypercoagulable state that triggered his DVT as this seems to be an unprovoked DVT.  Regarding his DVT, the patient will be treated with Xarelto 15 mg twice daily for 21 days and then transition to 20 mg once daily.  I sent him a prescription for the 20 mg once daily.  He has a starter pack of Xarelto to complete the first month.  I will check a CBC today to monitor for anemia and get a baseline hemoglobin.  I would like to recheck his CBC in 1 week to ensure that he is not losing blood on the Xarelto.  If he is losing significant amounts of blood we will need to discontinue the Xarelto and consider an IVC filter in his right leg.  More than 25 minutes was spent with the patient explaining the situation.  The patient comes back in 1 week to recheck his CBC I will also check a celiac panel.  07/11/18 Patient was recently admitted to the hospital.  I have copied relevant portions of the discharge summary and included them below for my reference: Admit date: 07/02/2018  Discharge date: 07/07/2018  Admitted From:Home  Disposition:  Home  Recommendations for Outpatient Follow-up:  1. Follow up with PCP in 1-2 weeks 2. Continue on IV Rocephin daily as scheduled through 08/14/2018 3. Follow-up with ID Dr. Drucilla Reed in early June which will be scheduled in the  outpatient setting 4. Patient will have repeat lab work weekly  Home Health: Yes with RN  Equipment/Devices: Home infusions have been set up  Discharge Condition: Stable  CODE STATUS: Full  Diet recommendation: Heart Healthy/carb modified  Brief/Interim Summary: Per HPI: Joel Reedis a 68 y.o.malewith medical history significant ofnonalcoholic liver cirrhosis, celiac disease, diabetes mellitus type 2, hypertension came to the hospital with complaints of fever and fatigue. Patient states he was feeling well up until yesterday evening but this morning when he woke up he started having subjective fevers and chills with feeling of nausea. Denies any abdominal pain, shortness of breath, cough or any other symptoms. Denies any sick contacts. In the ER he was noted to have temperature of 101.1 which went up to 102.3. COVID-negative. Saturating greater than 90% on room air. Chest x-ray showed questionable infiltrate at the bases. Normal WBC. While in the ER he had one episode of  diarrhea but no more since. It was nonbloody. He was started on IV Rocephin and azithromycin admitted to the hospital.  Patient was admitted with fever, initially with unknown etiology, but it now appears that he has strep group G bacteremia. Hedenies any further symptomatology and has been without fever in the last48hours. Consulted with cardiology on 5/22 who stated that TEE currently appears unnecessary. Unfortunately, no source of infection is otherwise noted. Discussed with ID Dr. Drucilla Reed earlier this morning on 5/23 and increased dose of Rocephin for bacteremia coverage.  Repeat blood cultures have been negative since 5/22.  He has been noted to have some right lower extremity swelling for which repeat ultrasound was performed and was negative for DVT.  He follows up with Dr. Walden Reed regarding his DVT management and is currently on Xarelto.  He does appear to have some mild findings of  cellulitis to that site which should improve with treatment on IV antibiotics as currently scheduled.  He will maintain leg elevation as well as compression stockings to assist with the swelling on that side.  He has received a PICC line to his right upper extremity on 5/24.  He has otherwise been set up with home health services and home infusions to continue for the next 6 weeks with IV Rocephin 2 g daily.  He will follow-up with Dr. Drucilla Reed in the outpatient setting for follow-up.  He will also have weekly lab work performed.  No other acute events noted during the course of this admission.  Discharge Diagnoses:  Principal Problem:   Fever Active Problems:   Type II diabetes mellitus, uncontrolled (HCC)   HTN (hypertension), benign   HLD (hyperlipidemia)   PVD (peripheral vascular disease) (HCC)   Gastritis and duodenitis   Celiac disease   Fatigue   NAFLD (nonalcoholic fatty liver disease)   Cirrhosis, non-alcoholic (HCC)   Bacteremia due to Gram-positive bacteria  Principal discharge diagnosis: Strep group G bacteremia.  Patient is here today for follow-up.  He is doing much better since starting Rocephin.  He denies any additional fevers.  He denies any nausea or vomiting.  He denies any systemic symptoms of illness.  He is actually back at work.  However he is concerned because of the uncertainty as to the portal of entry of his group G bacteremia.  Research I conducted prior to his visit today shows that 56% are skin and soft tissue infections, 10% or bone infections, 5% are cardiac source, 4% are GI sources, the rest remain idiopathic.  Patient denies any breaks in his skin or recent cellulitis.  I was previously treating the patient for a DVT which was diagnosed in November 2019.  While in the hospital they repeated a right leg ultrasound which showed resolution of the DVT however it also showed right inguinal lymphadenopathy.  Because of this I asked to examine that region today on  his exam.  The patient has severe intertrigo.  There is a serpiginous bright red rash in the right inguinal fold and underneath his pannus of his abdomen in the suprapubic area.  The skin there is cracked and irritated severely.  Patient has been applying hydrocortisone cream to this.  I believe that this is likely the most likely source of injury.  I believe the patient has chronic intertrigo and likely had secondary cellulitis that progressed to strep group B bacteremia.  Obviously this is a guess at this time as the cellulitis has improved.  However I believe this is the most  likely source.  His blood pressure today is 162/80.  He states that he checked it prior to arrival and it was 138/80 at home.  He states that is never elevated when he checks his blood pressure at home.  He denies any chest pain shortness of breath or dyspnea on exertion.  At that time, my plan was: Patient has a follow-up visit to see infectious disease on June 16.  He will complete 6 weeks of Rocephin for group B strep bacteremia.  At this point my best guess as to the portal of entry would be secondary cellulitis of the intertrigo in his right inguinal crease.  This would also make sense with the right inguinal lymphadenopathy seen on his right leg venous ultrasound.  I will repeat a CBC and a CMP today.  While checking lab work I will also repeat hemoglobin A1c to ensure adequate control of his diabetes.  His blood pressure remains significantly elevated.  I have recommended that he check it morning and night for the next week and email [place] the values that we can titrate blood pressure medication if necessary.  Patient is completed 6 months of therapy for the DVT and therefore I believe he can discontinue his Xarelto.  I will treat the intertrigo with Lotrisone cream twice daily for 10 to 14 days.  We also spent 10 minutes discussing ways to help prevent this in the future including weight loss, drying that area thoroughly after bathing  with a hair dryer, applying liberal amounts of Goldbond powder as a desiccant, wearing loosefitting clothing to allow better air exchange in that area to keep the area dry.  08/25/18 At his last visit, hemoglobin A1c was elevated at 7.2 and we recommended increasing his metformin.  He is currently taking 750 mg twice daily.  Patient completed IV antibiotics on July 2 and his PICC line was discontinued.  He is now following up with infectious disease on an as-needed basis.  Patient denies any fever.  He denies any fatigue.  He denies any night sweats.  There has not been any recurrence of the cellulitis or intertrigo on his lower abdomen.  Overall he is doing very well.  He is yet to return to work.  Since I last saw him, his blood sugars have been in the 1 70-1 80 range despite taking metformin 750 mg twice daily.  This is not well controlled.  He denies any polyuria, polydipsia, or blurry vision.  He has discontinued aspirin.  He has discontinued Xarelto.  He did have irritation in the lining of his stomach and small intestine confirmed on EGD.  He was placed on Protonix for this.  I have suggested that he continue this medication indefinitely.  I would also like him to avoid aspirin given the fact he had a microcytic anemia secondary to GI bleed.  Over the last few weeks, his platelet count has been in the 80s.  He has a history of thrombocytopenia secondary to his cirrhosis.  He typically runs between 80-100.  I would like to follow this up.  Otherwise he is doing well with no concerns Past Medical History:  Diagnosis Date   Allergy    Celiac disease    Cirrhosis ([place])    Diabetes mellitus without complication ([place])    Gastritis and duodenitis    on egd 12/2017   Hyperlipidemia    Patient denies   Hypertension    IDA (iron deficiency anemia)    Internal hemorrhoids  Nephrolithiasis    PVD (peripheral vascular disease) (HCC)    decreased TBI in right foot.   Right buttock pain  07/29/2018   Sleep apnea    Tubular adenoma of colon    Past Surgical History:  Procedure Laterality Date   COLONOSCOPY     JOINT REPLACEMENT     left hip Dr Noemi Chapel   SPINE SURGERY     disectomy   Current Outpatient Medications on File Prior to Visit  Medication Sig Dispense Refill   amLODipine (NORVASC) 5 MG tablet Take 1 tablet by mouth once daily 90 tablet 3   cholecalciferol (VITAMIN D3) 25 MCG (1000 UT) tablet Take 1,000 Units by mouth daily.     clotrimazole-betamethasone (LOTRISONE) cream Apply 1 application topically 2 (two) times daily. 60 g 2   fluticasone (FLONASE) 50 MCG/ACT nasal spray Place 2 sprays into both nostrils daily. 16 g 2   glimepiride (AMARYL) 4 MG tablet Take 0.5 tablets (2 mg total) by mouth daily. 45 tablet 0   losartan (COZAAR) 100 MG tablet Take 1 tablet by mouth once daily 90 tablet 3   metFORMIN (GLUCOPHAGE) 500 MG tablet Take 1.5 tablets (750 mg total) by mouth 2 (two) times daily with a meal. 270 tablet 2   metoprolol succinate (TOPROL-XL) 25 MG 24 hr tablet Take 1 tablet by mouth once daily 90 tablet 3   pantoprazole (PROTONIX) 40 MG tablet Take 1 tablet (40 mg total) by mouth daily. 90 tablet 3   vitamin B-12 (CYANOCOBALAMIN) 500 MCG tablet Take 500 mcg by mouth daily.     XARELTO 20 MG TABS tablet TAKE 1 TABLET BY MOUTH DAILY WITH SUPPER. 90 tablet 1   No current facility-administered medications on file prior to visit.    Allergies  Allergen Reactions   Gluten Meal    Social History   Socioeconomic History   Marital status: Married    Spouse name: Not on file   Number of children: Not on file   Years of education: Not on file   Highest education level: Not on file  Occupational History   Not on file  Social Needs   Financial resource strain: Not on file   Food insecurity    Worry: Not on file    Inability: Not on file   Transportation needs    Medical: Not on file    Non-medical: Not on file  Tobacco Use     Smoking status: Never Smoker   Smokeless tobacco: Never Used  Substance and Sexual Activity   Alcohol use: No   Drug use: No   Sexual activity: Not on file  Lifestyle   Physical activity    Days per week: Not on file    Minutes per session: Not on file   Stress: Not on file  Relationships   Social connections    Talks on phone: Not on file    Gets together: Not on file    Attends religious service: Not on file    Active member of club or organization: Not on file    Attends meetings of clubs or organizations: Not on file    Relationship status: Not on file   Intimate partner violence    Fear of current or ex partner: Not on file    Emotionally abused: Not on file    Physically abused: Not on file    Forced sexual activity: Not on file  Other Topics Concern   Not on file  Social History Narrative  Not on file      Review of Systems  All other systems reviewed and are negative.      Objective:   Physical Exam  Constitutional: He appears well-developed and well-nourished. No distress.  Neck: No JVD present.  Cardiovascular: Normal rate, regular rhythm and normal heart sounds. Exam reveals no gallop and no friction rub.  No murmur heard. Pulmonary/Chest: Effort normal and breath sounds normal. No respiratory distress. He has no wheezes. He has no rales.  Abdominal: Soft. Bowel sounds are normal. He exhibits no distension and no mass. There is no abdominal tenderness. There is no rebound and no guarding.  Musculoskeletal:        General: No edema.  Skin: No rash noted. He is not diaphoretic. No erythema.  Vitals reviewed.     Assessment & Plan:  The primary encounter diagnosis was Controlled type 2 diabetes mellitus with complication, without long-term current use of insulin ([place]). Diagnoses of Bacteremia, Deep venous thrombosis (DVT) of right peroneal vein, unspecified chronicity, and NAFLD (nonalcoholic fatty liver disease) were also pertinent to this  visit. At this point, blood sugar remains elevated.  I have recommended increasing metformin to 1000 mg twice daily and then rechecking via telephone fasting blood sugar in 1 month.  Blood pressure today is well controlled.  We will make no changes in his blood pressure medication.  Clinically the patient appears resolved from his bacteremia.  Source appears to have been cellulitis.  Patient will notify me immediately if he develops any fevers or signs of systemic illness.  DVT has resolved.  He is no longer on Xarelto.  Given his recent history of microcytic anemia secondary to GI bleed I have recommended avoiding aspirin.  I will monitor his hemoglobin and I will also monitor his platelet counts.  He has a history of thrombocytopenia partly due to nonalcoholic fatty liver disease and cirrhosis.  Monitor platelets

## 2018-09-16 ENCOUNTER — Telehealth: Payer: Self-pay

## 2018-09-16 ENCOUNTER — Telehealth: Payer: Self-pay | Admitting: Internal Medicine

## 2018-09-16 NOTE — Telephone Encounter (Signed)
See additional phone note.

## 2018-09-16 NOTE — Telephone Encounter (Signed)
-----   Message from Algernon Huxley, RN sent at 02/17/2018  1:20 PM EST ----- Regarding: [place] ABD Pt needs [place] screening

## 2018-09-16 NOTE — Telephone Encounter (Signed)
Pt scheduled for [place] of abd at [facility] 09/22/18@7am , pt to arrive there at 6:45am and be NPO after midnight. Left message for pt to call back.

## 2018-09-16 NOTE — Telephone Encounter (Signed)
Spoke with pt and he is aware of appt.

## 2018-09-16 NOTE — Telephone Encounter (Signed)
Left message for pt to call back  °

## 2018-09-17 ENCOUNTER — Encounter: Payer: Self-pay | Admitting: Internal Medicine

## 2018-09-18 ENCOUNTER — Other Ambulatory Visit: Payer: Self-pay | Admitting: Family Medicine

## 2018-09-22 ENCOUNTER — Ambulatory Visit (HOSPITAL_COMMUNITY)
Admission: RE | Admit: 2018-09-22 | Discharge: 2018-09-22 | Disposition: A | Discharge status: Home / Self Care | Payer: Medicare Other | Source: Ambulatory Visit | Attending: Internal Medicine | Admitting: Internal Medicine

## 2018-09-22 ENCOUNTER — Other Ambulatory Visit: Payer: Self-pay

## 2018-09-22 DIAGNOSIS — K7460 Unspecified cirrhosis of liver: Secondary | ICD-10-CM | POA: Diagnosis not present

## 2018-09-22 DIAGNOSIS — R161 Splenomegaly, not elsewhere classified: Secondary | ICD-10-CM | POA: Diagnosis not present

## 2018-09-22 DIAGNOSIS — K760 Fatty (change of) liver, not elsewhere classified: Secondary | ICD-10-CM | POA: Diagnosis not present

## 2018-09-22 IMAGING — US ULTRASOUND ABDOMEN COMPLETE
1 series · 13 of 25 positions shown · non-contrast
Comparison: [DATE].

CLINICAL DATA: Hepatic cirrhosis

EXAM:
ABDOMEN ULTRASOUND COMPLETE

[Series 1: ultrasound abdomen complete · 13 of 60 slices shown]
[im 1/60]
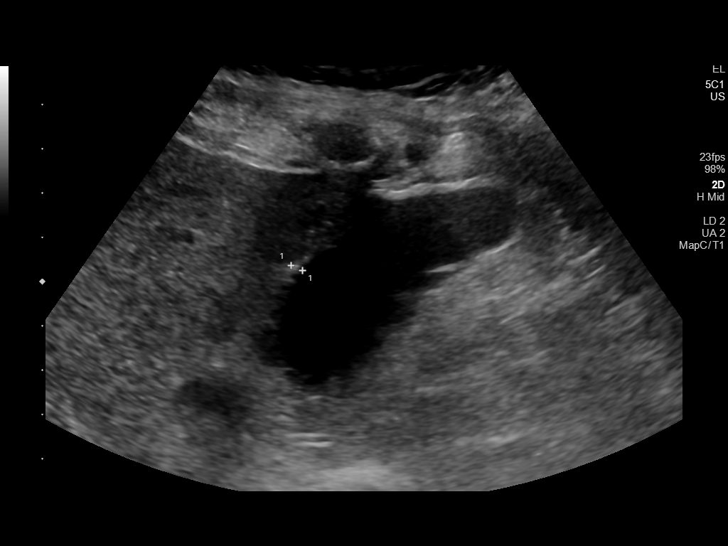
[im 5/60]
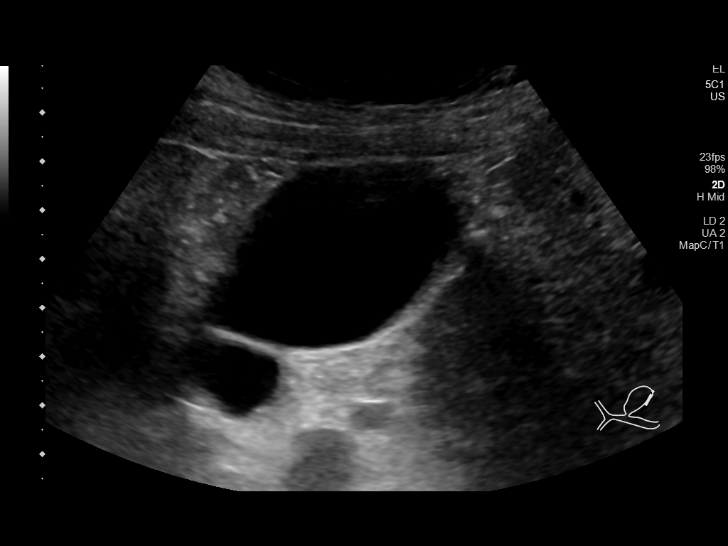
[im 10/60]
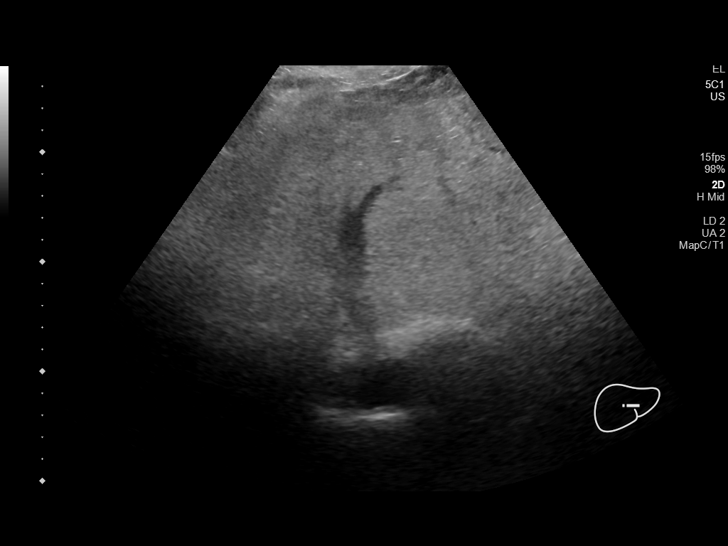
[im 15/60]
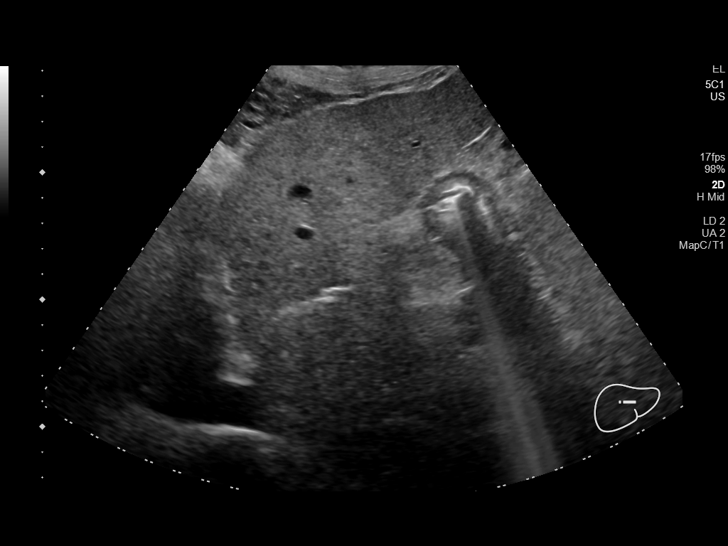
[im 20/60]
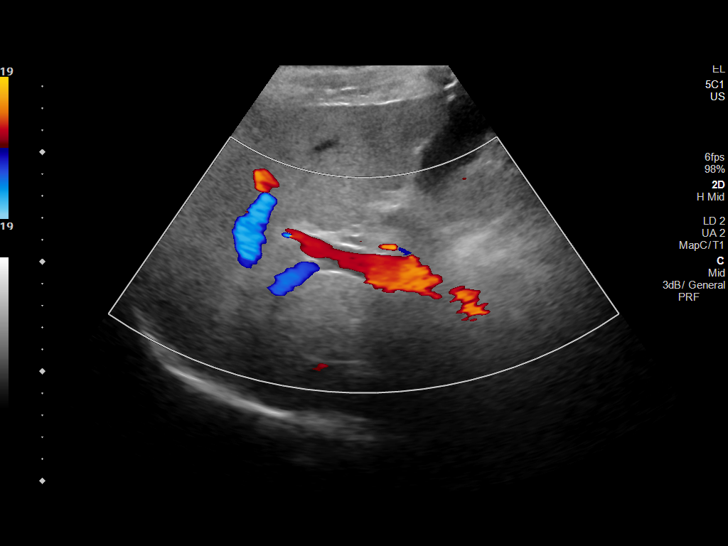
[im 25/60]
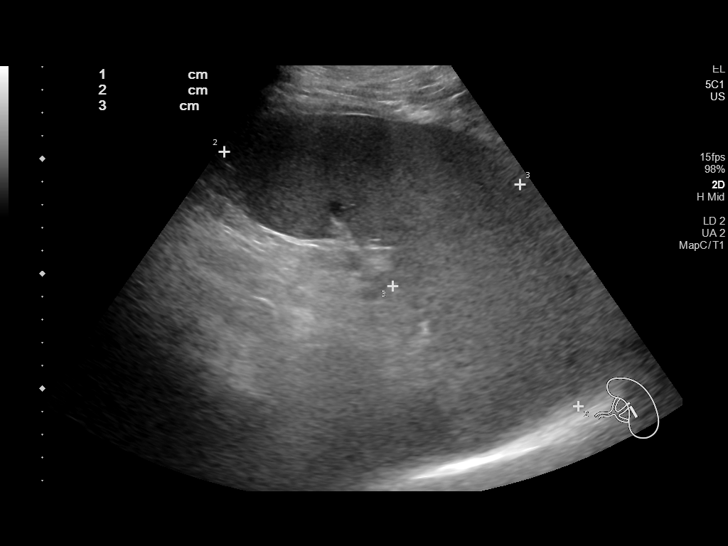
[im 30/60]
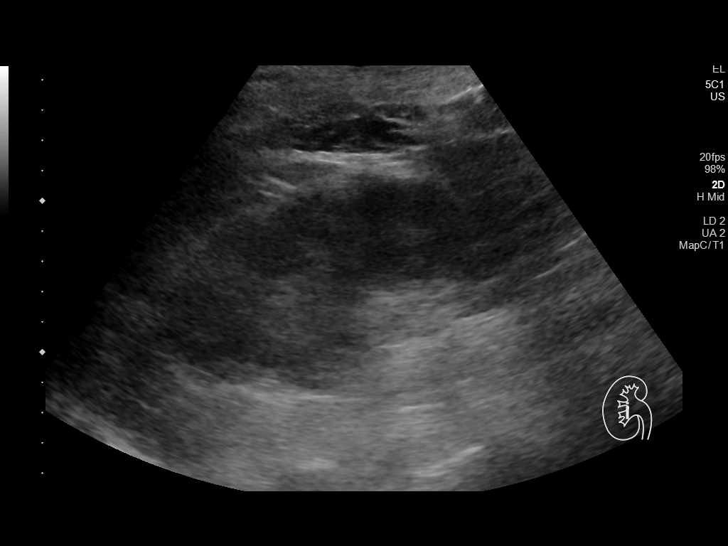
[im 35/60]
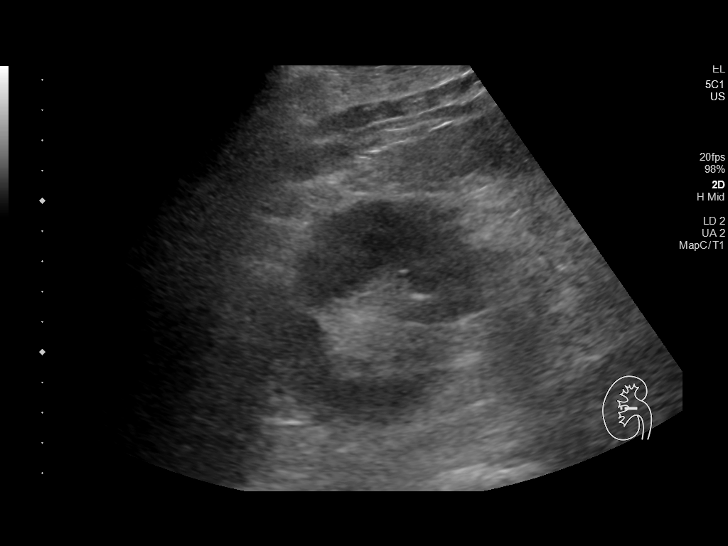
[im 40/60]
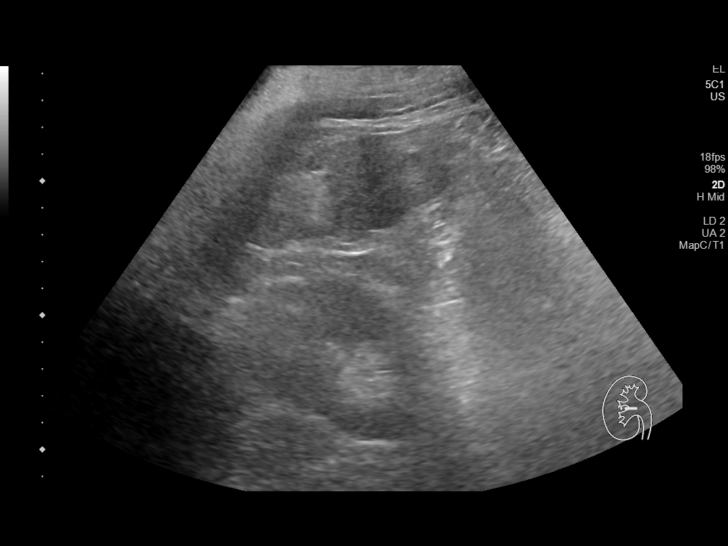
[im 45/60]
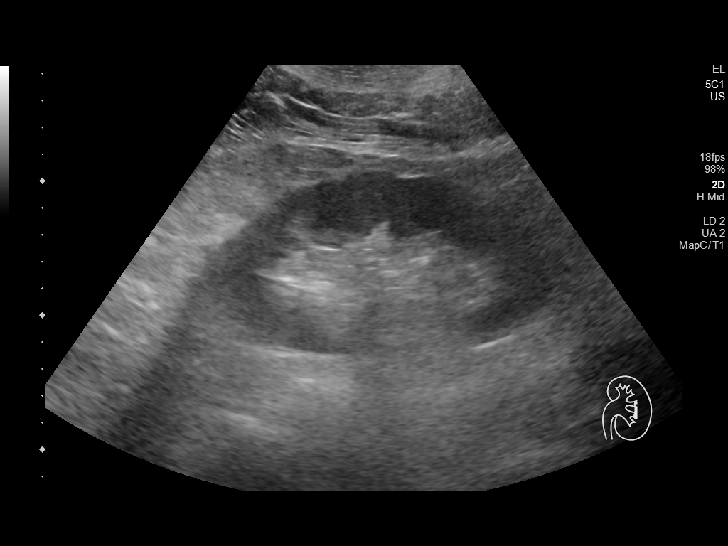
[im 50/60]
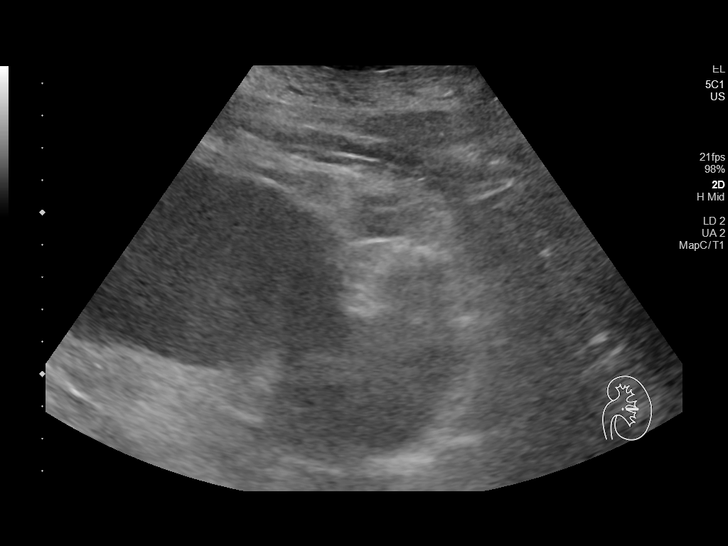
[im 55/60]
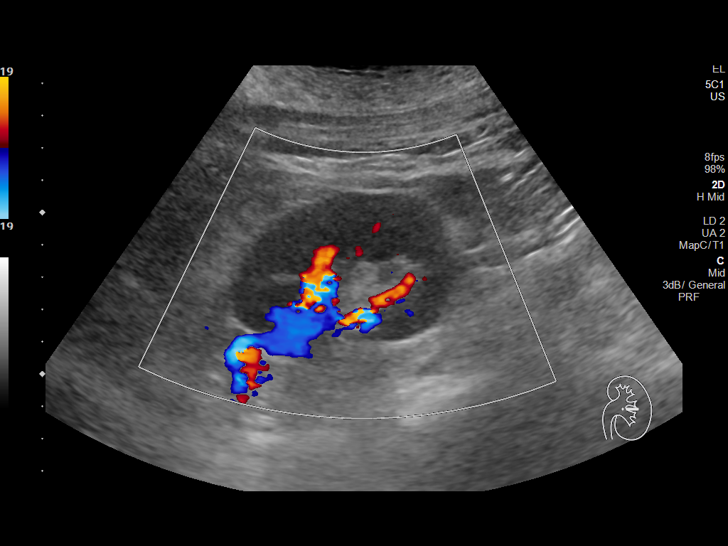
[im 60/60]
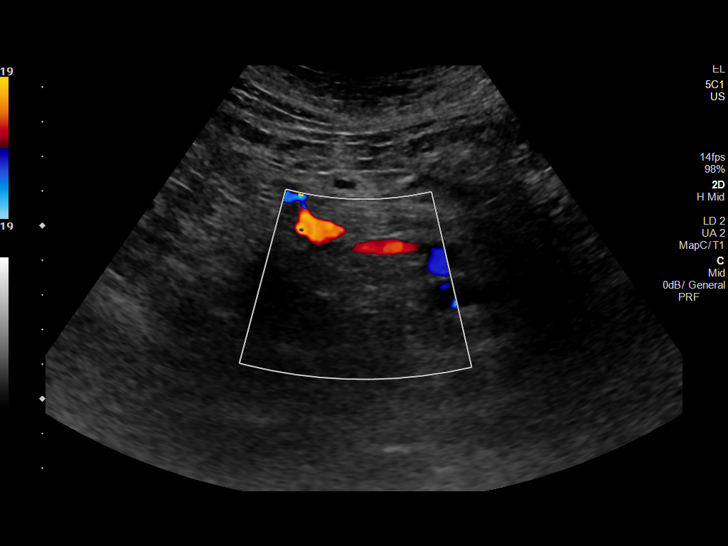

[13 of 25 positions shown; findings below may reference images not displayed]

FINDINGS: Gallbladder: No gallstones or wall thickening visualized. There is
no pericholecystic fluid. No sonographic Murphy sign noted by
sonographer.

Common bile duct: Diameter: 3 mm. No intrahepatic, common hepatic,
or common bile duct dilatation.

Liver: No focal lesion identified. Liver echogenicity is increased
diffusely. Liver echotexture is rather coarse. Liver contour is
mildly nodular. Portal vein is patent on color Doppler imaging with
normal direction of blood flow towards the liver.

IVC: No abnormality visualized.

Pancreas: Visualized portion unremarkable. Portions of pancreas
obscured by gas.

Spleen: Spleen measures 15.1 x 19.0 x 7.1 cm with a measured splenic
volume of 1,066 cubic cm. No focal splenic lesions evident.

Right Kidney: Length: 13.4 cm. Echogenicity within normal limits. No
mass or hydronephrosis visualized.

Left Kidney: Length: 12.8 cm. Echogenicity within normal limits. No
mass or hydronephrosis visualized.

Abdominal aorta: No aneurysm visualized.

Other findings: No demonstrable ascites.
IMPRESSION: 1. The appearance of the liver is again noted to be consistent with
hepatic cirrhosis. While no focal liver lesions are evident on this
study, it must be cautioned that the sensitivity of ultrasound for
detection of focal liver lesions is diminished in this circumstance.

2.  Splenomegaly again noted.

3. Portions of pancreas obscured by gas. Visualized portions of
pancreas appear unremarkable.

4.  Study otherwise unremarkable.

## 2018-09-24 ENCOUNTER — Other Ambulatory Visit: Payer: Self-pay | Admitting: Family Medicine

## 2018-09-29 ENCOUNTER — Other Ambulatory Visit: Payer: Self-pay | Admitting: *Deleted

## 2018-09-29 MED ORDER — BLOOD GLUCOSE TEST VI STRP: ORAL_STRIP | 3 refills | Status: DC

## 2018-11-06 ENCOUNTER — Encounter: Payer: Self-pay | Admitting: *Deleted

## 2018-11-13 ENCOUNTER — Other Ambulatory Visit (INDEPENDENT_AMBULATORY_CARE_PROVIDER_SITE_OTHER): Payer: Medicare Other

## 2018-11-13 ENCOUNTER — Encounter: Payer: Self-pay | Admitting: Internal Medicine

## 2018-11-13 ENCOUNTER — Ambulatory Visit (INDEPENDENT_AMBULATORY_CARE_PROVIDER_SITE_OTHER): Payer: Medicare Other | Admitting: Internal Medicine

## 2018-11-13 VITALS — BP 158/70 | HR 80 | Temp 98.1°F | Ht 72.0 in | Wt 258.0 lb

## 2018-11-13 DIAGNOSIS — K760 Fatty (change of) liver, not elsewhere classified: Secondary | ICD-10-CM

## 2018-11-13 DIAGNOSIS — K766 Portal hypertension: Secondary | ICD-10-CM

## 2018-11-13 DIAGNOSIS — D509 Iron deficiency anemia, unspecified: Secondary | ICD-10-CM | POA: Diagnosis not present

## 2018-11-13 DIAGNOSIS — K7460 Unspecified cirrhosis of liver: Secondary | ICD-10-CM | POA: Diagnosis not present

## 2018-11-13 DIAGNOSIS — K900 Celiac disease: Secondary | ICD-10-CM

## 2018-11-13 DIAGNOSIS — Z23 Encounter for immunization: Secondary | ICD-10-CM

## 2018-11-13 LAB — CBC WITH DIFFERENTIAL/PLATELET
Basophils Absolute: 0 10*3/uL (ref 0.0–0.1)
Basophils Relative: 0.9 % (ref 0.0–3.0)
Eosinophils Absolute: 0.1 10*3/uL (ref 0.0–0.7)
Eosinophils Relative: 2.7 % (ref 0.0–5.0)
HCT: 43.5 % (ref 39.0–52.0)
Hemoglobin: 15 g/dL (ref 13.0–17.0)
Lymphocytes Relative: 30.3 % (ref 12.0–46.0)
Lymphs Abs: 1.5 10*3/uL (ref 0.7–4.0)
MCHC: 34.4 g/dL (ref 30.0–36.0)
MCV: 87.8 fl (ref 78.0–100.0)
Monocytes Absolute: 0.5 10*3/uL (ref 0.1–1.0)
Monocytes Relative: 10.5 % (ref 3.0–12.0)
Neutro Abs: 2.7 10*3/uL (ref 1.4–7.7)
Neutrophils Relative %: 55.6 % (ref 43.0–77.0)
Platelets: 123 10*3/uL — ABNORMAL LOW (ref 150.0–400.0)
RBC: 4.96 Mil/uL (ref 4.22–5.81)
RDW: 13.7 % (ref 11.5–15.5)
WBC: 4.8 10*3/uL (ref 4.0–10.5)

## 2018-11-13 LAB — COMPREHENSIVE METABOLIC PANEL
ALT: 124 U/L — ABNORMAL HIGH (ref 0–53)
AST: 66 U/L — ABNORMAL HIGH (ref 0–37)
Albumin: 4.6 g/dL (ref 3.5–5.2)
Alkaline Phosphatase: 64 U/L (ref 39–117)
BUN: 14 mg/dL (ref 6–23)
CO2: 28 mEq/L (ref 19–32)
Calcium: 9.7 mg/dL (ref 8.4–10.5)
Chloride: 101 mEq/L (ref 96–112)
Creatinine, Ser: 0.82 mg/dL (ref 0.40–1.50)
GFR: 93.95 mL/min (ref >60.00)
Glucose, Bld: 157 mg/dL — ABNORMAL HIGH (ref 70–99)
Potassium: 4.1 mEq/L (ref 3.5–5.1)
Sodium: 137 mEq/L (ref 135–145)
Total Bilirubin: 0.8 mg/dL (ref 0.2–1.2)
Total Protein: 8 g/dL (ref 6.0–8.3)

## 2018-11-13 LAB — PROTIME-INR
INR: 1.1 ratio — ABNORMAL HIGH (ref 0.8–1.0)
Prothrombin Time: 13 s (ref 9.6–13.1)

## 2018-11-13 NOTE — Progress Notes (Signed)
Subjective:    Patient ID: Joel Reed., male    DOB: 22-Aug-1950, 68 y.o.   MRN: [id number]  HPI Joel Reed is a 68 year old male with a history of nonalcoholic fatty liver disease cirrhosis with splenomegaly and thrombocytopenia, celiac disease, iron deficiency anemia, small colonic angioectasias, history of colon polyps, history of DVT, hypertension, hyperlipidemia and diabetes who is here for follow-up.  He is here today with his wife.  He was last seen on 02/06/2018.  He reports that he has been doing well.  He is feeling well.  He has followed a strict gluten-free diet and has adjusted this well.  He does continue to take pantoprazole 40 mg a day.  We started this after endoscopy showed gastritis with erosions.  He is not having any abdominal pain or heartburn.  No dysphagia or odynophagia.  He is also continued oral iron.  He takes B12 daily.  No increasing abdominal girth or lower extremity swelling.  No jaundice.  No itching.  No bleeding.  Bowel movements have been regular.  He has lost about 6 or 7 pounds since last being here.  He continues to work 12 hours a day and stands all day for work.  This is most of the exercise that he gets.  He did have a recent abdominal ultrasound which we reviewed together.  See below   Review of Systems As per HPI, otherwise negative  Current Medications, Allergies, Past Medical History, Past Surgical History, Family History and Social History were reviewed in [employer] record.     Objective:   Physical Exam BP (!) 158/70   Pulse 80   Temp 98.1 F (36.7 C)   Ht 6' (1.829 m)   Wt 258 lb (117 kg)   BMI 34.99 kg/m  Gen: awake, alert, NAD HEENT: anicteric, op clear CV: RRR, no mrg Pulm: CTA b/l Abd: soft, obese, NT/ND, +BS throughout Ext: no c/c, trace pretibial edema Neuro: nonfocal  CBC    Component Value Date/Time   WBC 4.0 08/25/2018 0818   RBC 4.55 08/25/2018 0818   HGB 13.9 08/25/2018 0818   HCT  40.9 08/25/2018 0818   PLT 110 (L) 08/25/2018 0818   MCV 89.9 08/25/2018 0818   MCH 30.5 08/25/2018 0818   MCHC 34.0 08/25/2018 0818   RDW 13.2 08/25/2018 0818   LYMPHSABS 1,100 08/25/2018 0818   MONOABS 0.5 07/02/2018 1356   EOSABS 108 08/25/2018 0818   BASOSABS 20 08/25/2018 0818   CMP     Component Value Date/Time   NA 138 07/11/2018 1145   K 4.7 07/11/2018 1145   CL 102 07/11/2018 1145   CO2 25 07/11/2018 1145   GLUCOSE 169 (H) 07/11/2018 1145   BUN 13 07/11/2018 1145   CREATININE 0.89 07/11/2018 1145   CALCIUM 9.0 07/11/2018 1145   PROT 7.5 07/11/2018 1145   ALBUMIN 4.2 07/02/2018 1356   AST 47 (H) 07/11/2018 1145   ALT 65 (H) 07/11/2018 1145   ALKPHOS 63 07/02/2018 1356   BILITOT 0.9 07/11/2018 1145   GFRNONAA 90 07/11/2018 1145   GFRAA 104 07/11/2018 1145   Iron/TIBC/Ferritin/ %Sat    Component Value Date/Time   IRON 76 01/21/2018 1554   TIBC 431 01/21/2018 1554   FERRITIN 76 07/02/2018 1356   IRONPCTSAT 18 01/21/2018 1554   ABDOMEN ULTRASOUND COMPLETE   COMPARISON:  February 17, 2018.   FINDINGS: Gallbladder: No gallstones or wall thickening visualized. There is no pericholecystic fluid. No sonographic Percell Miller  sign noted by sonographer.   Common bile duct: Diameter: 3 mm. No intrahepatic, common hepatic, or common bile duct dilatation.   Liver: No focal lesion identified. Liver echogenicity is increased diffusely. Liver echotexture is rather coarse. Liver contour is mildly nodular. Portal vein is patent on color Doppler imaging with normal direction of blood flow towards the liver.   IVC: No abnormality visualized.   Pancreas: Visualized portion unremarkable. Portions of pancreas obscured by gas.   Spleen: Spleen measures 15.1 x 19.0 x 7.1 cm with a measured splenic volume of 1,066 cubic cm. No focal splenic lesions evident.   Right Kidney: Length: 13.4 cm. Echogenicity within normal limits. No mass or hydronephrosis visualized.   Left Kidney:  Length: 12.8 cm. Echogenicity within normal limits. No mass or hydronephrosis visualized.   Abdominal aorta: No aneurysm visualized.   Other findings: No demonstrable ascites.   IMPRESSION: 1. The appearance of the liver is again noted to be consistent with hepatic cirrhosis. While no focal liver lesions are evident on this study, it must be cautioned that the sensitivity of ultrasound for detection of focal liver lesions is diminished in this circumstance.   2.  Splenomegaly again noted.   3. Portions of pancreas obscured by gas. Visualized portions of pancreas appear unremarkable.   4.  Study otherwise unremarkable.     Electronically Signed   By: Lowella Grip III M.D.   On: 09/22/2018 08:22       Assessment & Plan:  68 year old male with a history of nonalcoholic fatty liver disease cirrhosis with splenomegaly and thrombocytopenia, celiac disease, iron deficiency anemia, small colonic angioectasias, history of colon polyps, history of DVT, hypertension, hyperlipidemia and diabetes who is here for follow-up.  1. NAFLD cirrhosis --his cirrhosis is very well compensated.  He does have splenomegaly and mild thrombocytopenia related to mild portal hypertension.  There is no evidence of ascites, hepatic encephalopathy.  He has not had issues with bleeding.  We discussed how the mainstay for nonalcoholic fatty liver disease is diet and exercise.  Weight loss and controlling risk factors for him specifically hypertension hyperlipidemia and diabetes.  We discussed that vitamin E does not have a role in fatty liver patients with diabetes.  He asked about milk thistle and while this may help it is very unlikely to hurt.  I am okay if he tries it. --[facility] screening up to date - Aug 2020, repeat with MRI liver protocol in Feb 2021  --Variceal screening neg Nov 2019, repeat in 2-3 yrs --Low Na diet --Prevnar13 today --He will have flu shot tomorrow at work --Risk factor modification,  diet and exercise  2.  IDA -- related to celiac disease and perhaps colonic angioectasias.  Expect this to be improved on gluten-free diet.  He has been on oral iron --Repeat blood counts, iron studies today --Continue oral iron for now  3.  Celiac disease --he is following a strict gluten-free diet. --Continue gluten-free diet --Repeating blood counts and iron studies today --Check TTG  4.  History of adenomatous polyps --surveillance of 5 years  25 minutes spent with the patient today. Greater than 50% was spent in counseling and coordination of care with the patient

## 2018-11-13 NOTE — Patient Instructions (Signed)
We have given you a pneumonia (pneumovax) vaccine today. You may experience a small amount of swelling and redness at the injection site. This is normal. Should you experience these symptoms, please apply ice to the injection area for 10-15 minutes every 2-3 hours. However, should these symptoms or any other symptoms related to the injection concern you, please call our office at [phone].  Your provider has requested that you go to the basement level for lab work before leaving today. Press "B" on the elevator. The lab is located at the first door on the left as you exit the elevator.  It has been recommended that you have a shingles vaccination. Please contact [facility]. They will set you up for shingles vaccination: phone number: [phone] Location: [address] [place], [place] [address]  You will be due for MRI in February 2021 for [facility] screening. We will contact you when it gets closer to that time with an appointment.  Please follow up with Dr Hilarie Fredrickson in 6 months in the office.  If you are age 89 or older, your body mass index should be between 23-30. Your Body mass index is 34.99 kg/m. If this is out of the aforementioned range listed, please consider follow up with your Primary Care Provider.  If you are age 99 or younger, your body mass index should be between 19-25. Your Body mass index is 34.99 kg/m. If this is out of the aformentioned range listed, please consider follow up with your Primary Care Provider.

## 2018-11-14 LAB — IBC + FERRITIN
Ferritin: 145.4 ng/mL (ref 22.0–322.0)
Iron: 91 ug/dL (ref 42–165)
Saturation Ratios: 21 % (ref 20.0–50.0)
Transferrin: 309 mg/dL (ref 212.0–360.0)

## 2018-11-14 LAB — TISSUE TRANSGLUTAMINASE, IGA: (tTG) Ab, IgA: 17 U/mL — ABNORMAL HIGH

## 2018-11-17 ENCOUNTER — Other Ambulatory Visit: Payer: Self-pay

## 2018-11-17 DIAGNOSIS — D509 Iron deficiency anemia, unspecified: Secondary | ICD-10-CM

## 2018-11-17 DIAGNOSIS — K760 Fatty (change of) liver, not elsewhere classified: Secondary | ICD-10-CM

## 2018-12-29 ENCOUNTER — Other Ambulatory Visit: Payer: Self-pay | Admitting: Family Medicine

## 2019-01-14 ENCOUNTER — Telehealth (INDEPENDENT_AMBULATORY_CARE_PROVIDER_SITE_OTHER): Payer: Medicare Other | Admitting: Family Medicine

## 2019-01-14 DIAGNOSIS — J0100 Acute maxillary sinusitis, unspecified: Secondary | ICD-10-CM | POA: Diagnosis not present

## 2019-01-14 MED ORDER — AMOXICILLIN-POT CLAVULANATE 875-125 MG PO TABS: 1.0000 | ORAL_TABLET | Freq: Two times a day (BID) | ORAL | 0 refills | Status: DC

## 2019-01-14 MED ORDER — LORATADINE 10 MG PO TABS: 10.0000 mg | ORAL_TABLET | Freq: Every day | ORAL | 1 refills | Status: DC | PRN

## 2019-01-14 NOTE — Progress Notes (Signed)
Virtual Visit via Telephone Note  I connected with Joel Reed. on 01/14/19 at 2:34pm by telephone and verified that I am speaking with the correct person using two identifiers.   I discussed the limitations, risks, security and privacy concerns of performing an evaluation and management service by telephone and the availability of in person appointments. I also discussed with the patient that there may be a patient responsible charge related to this service. The patient expressed understanding and agreed to proceed.   History of Present Illness: Pt with head cold, nasal congestion, for the past week, also with dry hacky cough.  He has some postnasal drip which he has been using nasal steroid as well as Benadryl which is helped a little.  He typically does get sinus infections twice a year.,  No shortness of breath no loss of taste or smell no fever no body aches no GI symptoms.  He is also using Robitussin-DM that does help with the cough.   No known contacts with COVID however he did travel 3 days before Thanksgiving and his symptoms started on the last day before he returned home.  He also works in the [employer] at a [employer].    Observations/Objective: No acute distress noted over the phone  Assessment and Plan: Acute sinusitis however with some upper respiratory symptoms that have started.  He is higher risk based on his comorbidities.  He is also recently traveled to [place] and works in the [employer].  I recommend that he get Covid testing as well.  He states that there is an urgent care that is closest to him so he will go there.  We will go ahead and start him on Augmentin we will also start antihistamine orally continue the nasal steroid and continue Robitussin-DM.  Follow Up Instructions:    I discussed the assessment and treatment plan with the patient. The patient was provided an opportunity to ask questions and all were answered. The patient agreed with the  plan and demonstrated an understanding of the instructions.   The patient was advised to call back or seek an in-person evaluation if the symptoms worsen or if the condition fails to improve as anticipated.  I provided 10 minutes of non-face-to-face time during this encounter. End Time  2:44pm   Vic Blackbird, MD

## 2019-01-16 ENCOUNTER — Other Ambulatory Visit: Payer: Self-pay

## 2019-01-16 DIAGNOSIS — Z20822 Contact with and (suspected) exposure to covid-19: Secondary | ICD-10-CM

## 2019-01-18 LAB — NOVEL CORONAVIRUS, NAA: SARS-CoV-2, NAA: DETECTED — AB

## 2019-01-19 ENCOUNTER — Telehealth: Payer: Self-pay | Admitting: Nurse Practitioner

## 2019-01-19 ENCOUNTER — Telehealth: Payer: Self-pay

## 2019-01-19 NOTE — Telephone Encounter (Signed)
Called to Discuss with patient about Covid symptoms and the use of bamlanivimab, a monoclonal antibody infusion for those with mild to moderate Covid symptoms and at a high risk of hospitalization.     Pt is qualified for this infusion at the [facility] infusion center due to co-morbid conditions and/or a member of an at-risk group.      Patient Active Problem List   Diagnosis Date Noted  . Right buttock pain 07/29/2018  . Bacteremia due to Gram-positive bacteria 07/03/2018  . Fever 07/02/2018  . Fatigue 07/02/2018  . NAFLD (nonalcoholic fatty liver disease) 07/02/2018  . Cirrhosis, non-alcoholic ([place]) [id number]  . Celiac disease   . Gastritis and duodenitis   . PVD (peripheral vascular disease) ([place])   . Type II diabetes mellitus, uncontrolled ([place]) 11/08/2016  . HTN (hypertension), benign 11/08/2016  . HLD (hyperlipidemia) 11/08/2016  . History of nephrolithiasis 11/08/2016     Patient states that he is not having any symptoms.

## 2019-01-19 NOTE — Telephone Encounter (Signed)
Received call from patient checking Covid results.  Advised results are positive.  Advised to contact PCP or visit ED if symptoms worsen.

## 2019-01-27 ENCOUNTER — Encounter: Payer: Self-pay | Admitting: Family Medicine

## 2019-02-26 ENCOUNTER — Other Ambulatory Visit: Payer: Self-pay | Admitting: *Deleted

## 2019-02-26 DIAGNOSIS — K7460 Unspecified cirrhosis of liver: Secondary | ICD-10-CM

## 2019-02-26 NOTE — Telephone Encounter (Signed)
Dr Hilarie Fredrickson, for [facility] screen MRI, am I ordering just an MRI abdomen only with contrast?

## 2019-02-26 NOTE — Telephone Encounter (Signed)
Yes abd only with contrast, hepatic protocol Thanks JMP

## 2019-02-26 NOTE — Telephone Encounter (Signed)
-----   Message from Larina Bras, [place] sent at 11/13/2018  5:09 PM EDT ----- Pt needs mri for [facility] screen around 03/2019. See 11/13/18 office note

## 2019-02-27 MED ORDER — LORAZEPAM 1 MG PO TABS: 1.0000 mg | ORAL_TABLET | ORAL | 0 refills | Status: DC

## 2019-02-27 NOTE — Telephone Encounter (Signed)
Lorazepam 1 mg PO 1 hour before MRI (he should not drive with this medication and have a care partner take him to and from the MRI)

## 2019-02-27 NOTE — Telephone Encounter (Signed)
Dr Hilarie Fredrickson, I have created lorazepam script. Can you please sign off since I no longer can?

## 2019-02-27 NOTE — Telephone Encounter (Signed)
Patient has been scheduled for MR abdomen with contrast hepatic protocol at [facility] radiology on Wednesday, 03/18/19 at 9:00 am, 8:30 am arrival. NPO 4 hours prior. I have spoken to patient to advise of this information and he verbalizes understanding.  Patient does indicate that he is claustrophobic. Would you like to give him anything to take prior to the test?

## 2019-03-02 NOTE — Telephone Encounter (Signed)
Patient advised that we have sent rx to pharmacy and that he will need someone to drive him to and from his appointment. He verbalizes understanding.

## 2019-03-10 ENCOUNTER — Telehealth: Payer: Self-pay | Admitting: Internal Medicine

## 2019-03-10 DIAGNOSIS — K7460 Unspecified cirrhosis of liver: Secondary | ICD-10-CM

## 2019-03-10 NOTE — Telephone Encounter (Signed)
Pt called stating that he had a message on his phone from the office advising that he needs labs before his imaging on 2/3. He did not recall the name of the person who left the message.

## 2019-03-10 NOTE — Telephone Encounter (Signed)
I called central scheduling and Joel Reed needs a BUN and Creatinine level prior to his MRI.  We have entered the orders so he can go to [facility] and have them drawn. He is aware of this and will call them tomorrow, phone # [phone].

## 2019-03-11 ENCOUNTER — Telehealth: Payer: Self-pay | Admitting: Internal Medicine

## 2019-03-11 NOTE — Telephone Encounter (Signed)
Spoke to [place] in the [employer] # [phone] and got the information. I told Joel Reed he can show up anytime between 8-4pm Monday thru Friday, no appointment needed. He said he will go tomorrow.

## 2019-03-12 ENCOUNTER — Other Ambulatory Visit (HOSPITAL_COMMUNITY)
Admission: RE | Admit: 2019-03-12 | Discharge: 2019-03-12 | Disposition: A | Discharge status: Home / Self Care | Payer: Medicare Other | Source: Ambulatory Visit | Attending: Internal Medicine | Admitting: Internal Medicine

## 2019-03-12 ENCOUNTER — Other Ambulatory Visit: Payer: Self-pay

## 2019-03-12 DIAGNOSIS — K7460 Unspecified cirrhosis of liver: Secondary | ICD-10-CM | POA: Insufficient documentation

## 2019-03-12 LAB — CREATININE, SERUM
Creatinine, Ser: 0.81 mg/dL (ref 0.61–1.24)
GFR calc Af Amer: >60 mL/min (ref >60)
GFR calc non Af Amer: >60 mL/min (ref >60)

## 2019-03-12 LAB — BUN: BUN: 14 mg/dL (ref 8–23)

## 2019-03-18 ENCOUNTER — Ambulatory Visit (HOSPITAL_COMMUNITY)
Admission: RE | Admit: 2019-03-18 | Discharge: 2019-03-18 | Disposition: A | Discharge status: Home / Self Care | Payer: Medicare Other | Source: Ambulatory Visit | Attending: Internal Medicine | Admitting: Internal Medicine

## 2019-03-18 ENCOUNTER — Other Ambulatory Visit: Payer: Self-pay

## 2019-03-18 DIAGNOSIS — K7460 Unspecified cirrhosis of liver: Secondary | ICD-10-CM

## 2019-03-18 IMAGING — MR MR ABDOMEN WO/W CM
10 of 19 series · 20 of 48 positions shown · IV contrast (gadavist)
Comparison: [DATE] abdominal sonogram. [DATE] CT
abdomen/pelvis.

CLINICAL DATA: Nonalcoholic fatty liver disease. Cirrhosis.
Screening.

EXAM:
MRI ABDOMEN WITHOUT AND WITH CONTRAST
TECHNIQUE: Multiplanar multisequence MR imaging of the abdomen was performed
both before and after the administration of intravenous contrast.
CONTRAST:  10mL GADAVIST GADOBUTROL 1 MMOL/ML IV SOLN

[Series 1: 3 plane non · axial · 8.0mm · 0.78mm/px · 1 of 13 slices shown]
[im 1/13]
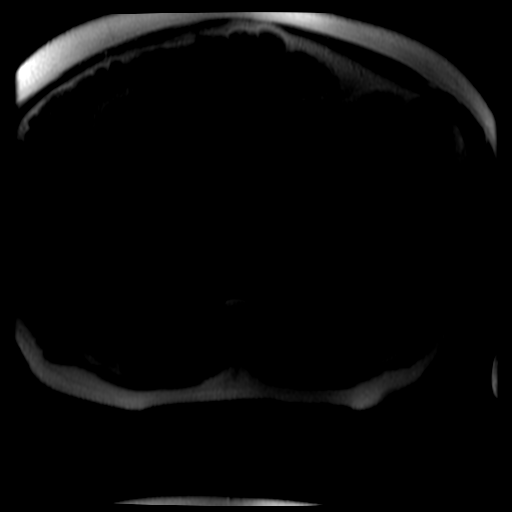

[Series 3: T2 fat-sat · axial · 5.0mm · 0.78mm/px · 1 of 59 slices shown]
[im 1/59]
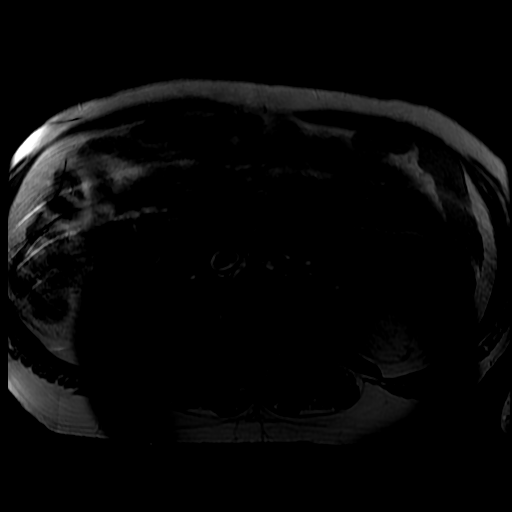

[Series 4: DWI b500 · axial · 6.0mm · 1.56mm/px · z∈[-128,+184]mm · 2 of 82 slices shown]
[im 1/82]
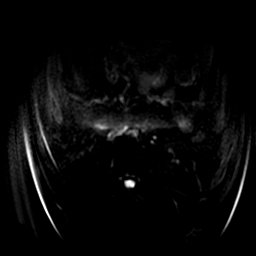
[im 82/82]
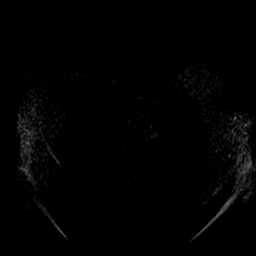

[Series 5: T2 · axial · 5.0mm · 0.78mm/px · z∈[-104,+186]mm · 2 of 59 slices shown (1 of 2)]
[im 1/59]
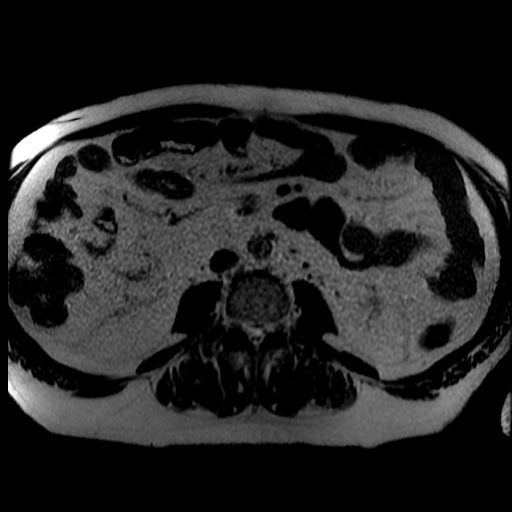
[im 59/59]
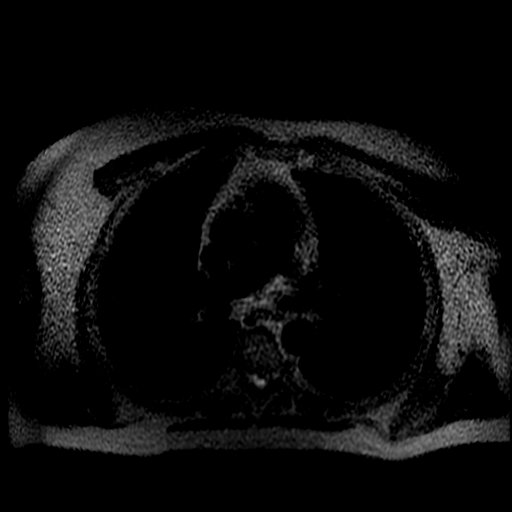

[Series 6: T2 · coronal · 5.0mm · 0.82mm/px · 2 of 61 slices shown (2 of 2)]
[im 1/61]
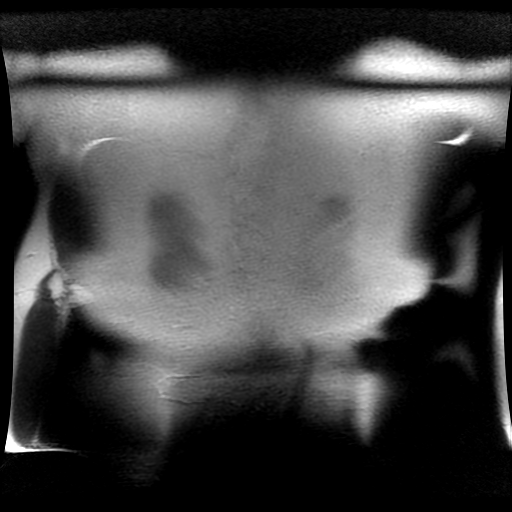
[im 61/61]
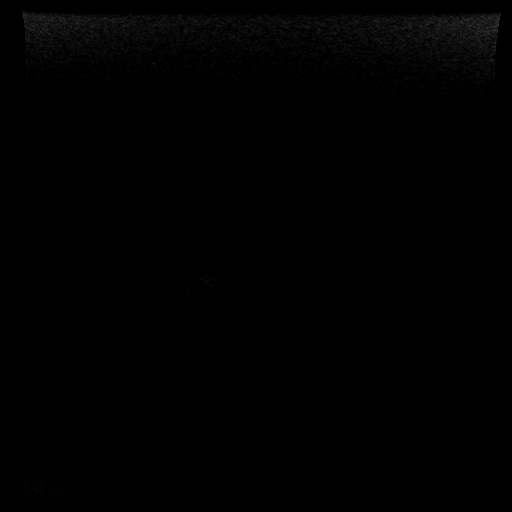

[Series 7: bSSFP · axial · 5.0mm · 0.78mm/px · z∈[-104,+186]mm · 2 of 59 slices shown]
[im 1/59]
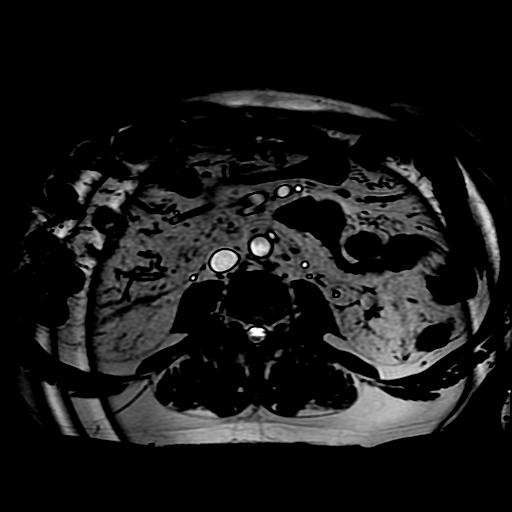
[im 59/59]
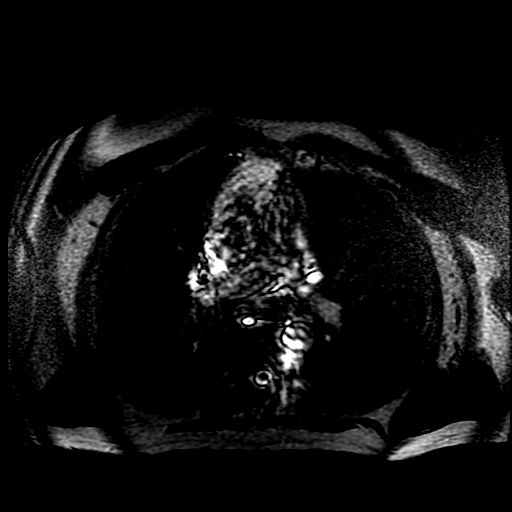

[Series 8: ax dualecho bh · axial · 5.0mm · 0.78mm/px · z∈[-104,+186]mm · 4 of 118 slices shown]
[im 1/118]
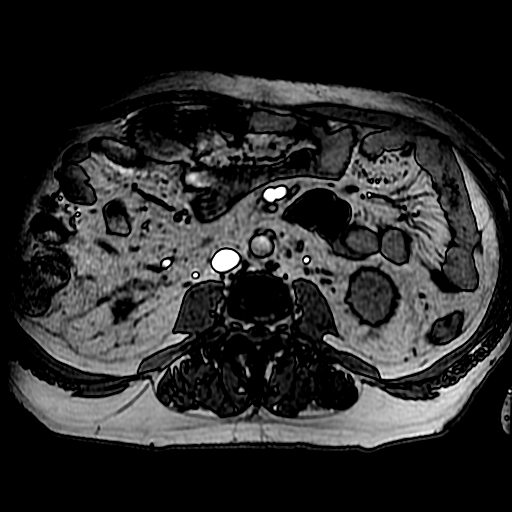
[im 40/118]
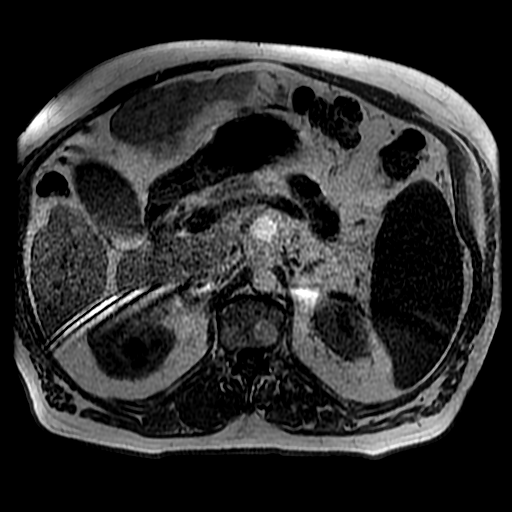
[im 79/118]
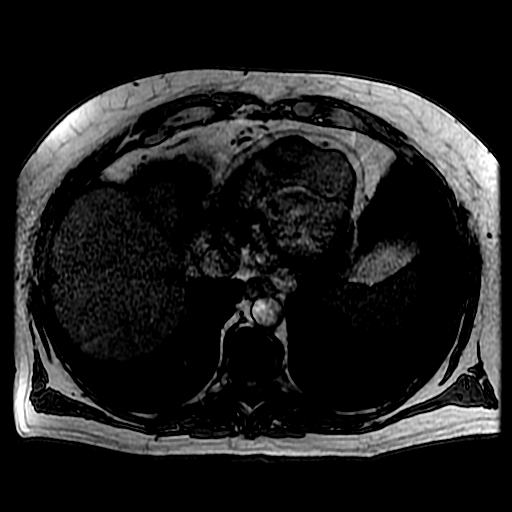
[im 118/118]
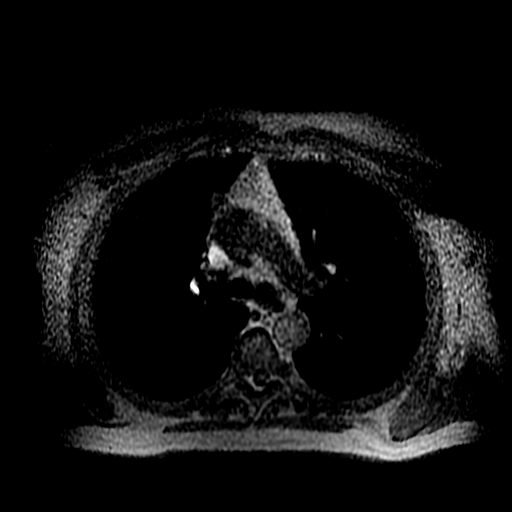

[Series 400: DWI · axial · 6.0mm · 1.56mm/px · 1 of 41 slices shown]
[im 1/41]
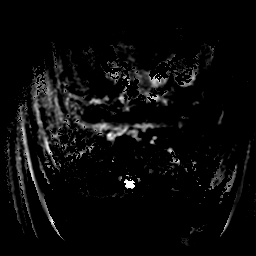

[Series 900: T1 dynamic · axial · 7.0mm · 0.78mm/px · z∈[-130,+174]mm · 3 of 88 slices shown (1 of 2)]
[im 1/88]
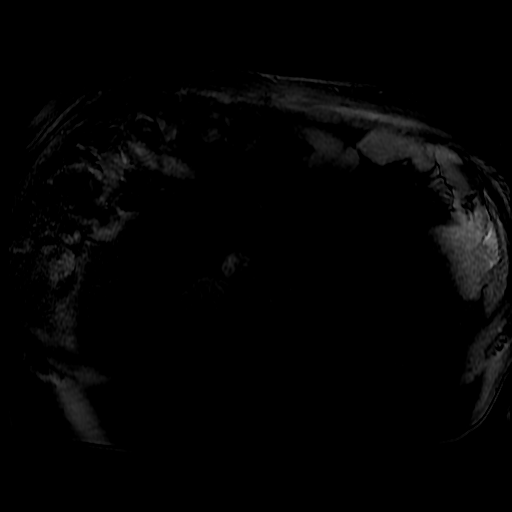
[im 44/88]
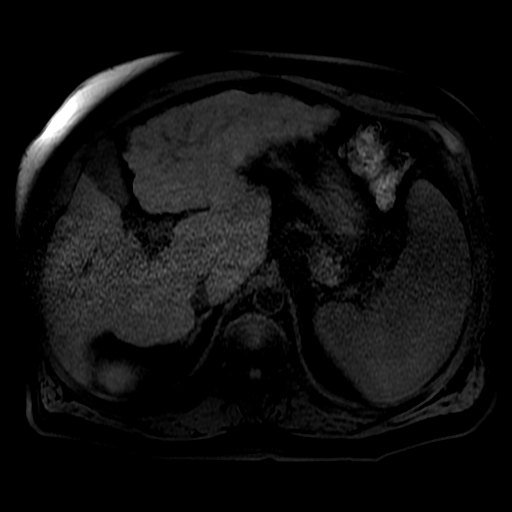
[im 88/88]
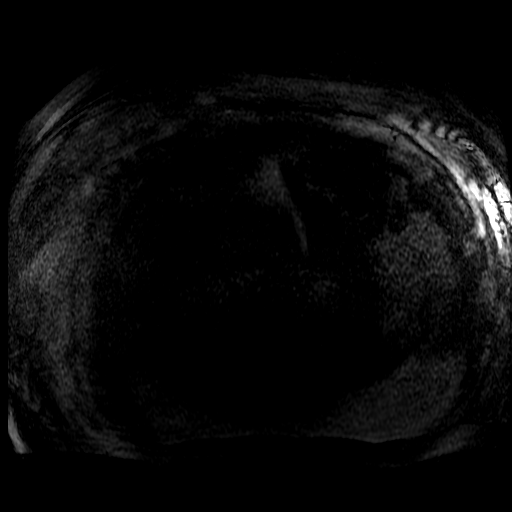

[Series 901: T1 dynamic · axial · 7.0mm · 0.78mm/px · z∈[-130,+20]mm · 2 of 88 slices shown (2 of 2)]
[im 1/88]
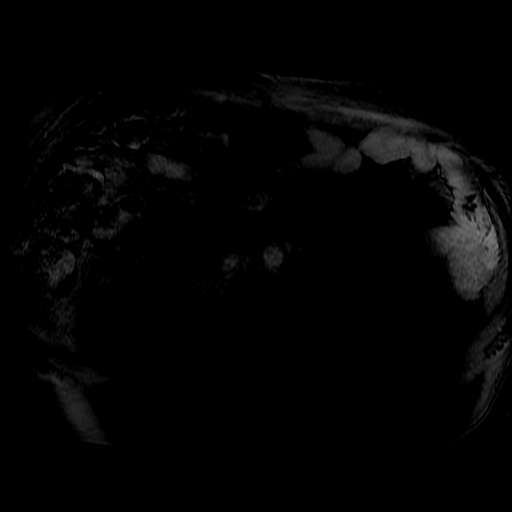
[im 44/88]
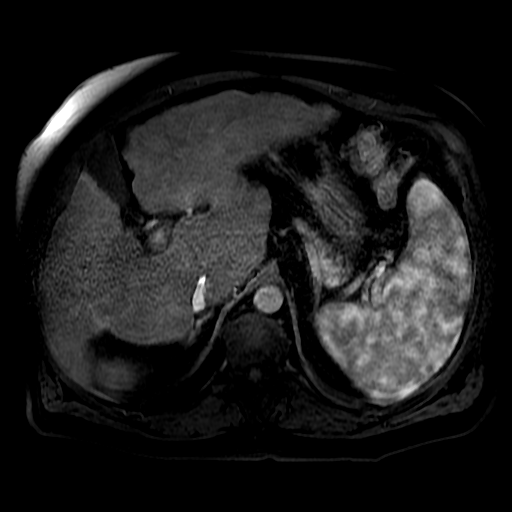

[20 of 48 positions shown; findings below may reference images not displayed]

FINDINGS: Lower chest: No acute abnormality at the lung bases.

Hepatobiliary: Diffusely irregular liver surface with relative
hypertrophy of the lateral segment left liver lobe and caudate lobe,
compatible with hepatic cirrhosis. There is patchy lace-like T2
hyperintensity throughout the liver, most prominent in the segment 8
right liver lobe, compatible with confluent hepatic fibrosis. Mild
diffuse hepatic steatosis. There is an indistinct 0.9 cm focus of
arterial phase hyperenhancement in the segment 6 right liver lobe
(series 901/image 49), without appreciable washout and without
discrete correlate on T2 or diffusion-weighted sequences, considered
LR-3. Otherwise no liver lesions. Normal gallbladder with no
cholelithiasis. No biliary ductal dilatation. Common bile duct
diameter 3 mm. No choledocholithiasis. No biliary strictures, masses
or beading.

Pancreas: No pancreatic mass or duct dilation.  No pancreas divisum.

Spleen: Mild splenomegaly. Craniocaudal splenic length 15.1 cm. No
splenic mass.

Adrenals/Urinary Tract: Normal adrenals. No hydronephrosis. Simple
subcentimeter upper left renal cyst. No suspicious renal masses.

Stomach/Bowel: Normal non-distended stomach. Visualized small and
large bowel is normal caliber, with no bowel wall thickening.

Vascular/Lymphatic: Normal caliber abdominal aorta. Patent portal,
splenic, hepatic and renal veins. Tiny gastroesophageal and
paraumbilical varices. No pathologically enlarged lymph nodes in the
abdomen.

Other: No abdominal ascites or focal fluid collection.

Musculoskeletal: No aggressive appearing focal osseous lesions.
IMPRESSION: 1. Cirrhosis with mild diffuse hepatic steatosis and regions of
confluent hepatic fibrosis. Tiny 0.9 cm LR-3 lesion in the segment 6
right liver lobe (intermediate probability of malignancy). No
additional liver lesions. Follow-up MRI abdomen without and with IV
contrast recommended in 3-6 months.
2. Mild splenomegaly. No ascites. Tiny gastroesophageal and
paraumbilical varices.

## 2019-03-18 MED ORDER — GADOBUTROL 1 MMOL/ML IV SOLN
10.0000 mL | Freq: Once | INTRAVENOUS | Status: AC | PRN
  Administered 2019-03-18: 10 mL via INTRAVENOUS

## 2019-06-11 LAB — HM DIABETES EYE EXAM

## 2019-06-14 ENCOUNTER — Other Ambulatory Visit: Payer: Self-pay | Admitting: Family Medicine

## 2019-06-19 ENCOUNTER — Other Ambulatory Visit: Payer: Self-pay | Admitting: Internal Medicine

## 2019-06-19 ENCOUNTER — Other Ambulatory Visit: Payer: Self-pay

## 2019-06-19 ENCOUNTER — Telehealth: Payer: Self-pay

## 2019-06-19 DIAGNOSIS — K769 Liver disease, unspecified: Secondary | ICD-10-CM

## 2019-06-19 DIAGNOSIS — K7460 Unspecified cirrhosis of liver: Secondary | ICD-10-CM

## 2019-06-19 NOTE — Telephone Encounter (Signed)
-----   Message from Algernon Huxley, RN sent at 03/19/2019 10:15 AM EST ----- Regarding: MRI Pt needs repeat MR of abd with and without contrast

## 2019-06-19 NOTE — Telephone Encounter (Signed)
Pt scheduled for MR Of abd at [facility] 07/01/19@1pm , pt to arrive there at 12:30pm and be NPO after 9am. Pt aware of appt but needs earlier am appt. Pt given the phone number [phone] to reschedule the appt to an earlier am appt.

## 2019-06-28 ENCOUNTER — Other Ambulatory Visit: Payer: Self-pay | Admitting: Family Medicine

## 2019-06-29 ENCOUNTER — Telehealth: Payer: Self-pay | Admitting: Internal Medicine

## 2019-06-29 MED ORDER — LORAZEPAM 1 MG PO TABS: 1.0000 mg | ORAL_TABLET | Freq: Three times a day (TID) | ORAL | 0 refills | Status: DC

## 2019-06-29 NOTE — Telephone Encounter (Signed)
Lorazepam 1 mg  PO 1 hour before MRI This is a sedative and he will need transportation to and from his appointment and should not drive a car until the next day

## 2019-06-29 NOTE — Telephone Encounter (Signed)
Spoke with pt and he is aware, script sent to pharmacy.

## 2019-06-29 NOTE — Telephone Encounter (Signed)
Pt would like a prescription for anxiety. He is scheduled to have an Mri on 5/20 and he stated that last time that he had this test done, he was prescribed one pill to help him. His pharmacy is [facility] in [place].

## 2019-06-29 NOTE — Telephone Encounter (Signed)
See note below and advise. 

## 2019-07-01 ENCOUNTER — Ambulatory Visit (HOSPITAL_COMMUNITY): Payer: Medicare Other

## 2019-07-02 ENCOUNTER — Other Ambulatory Visit: Payer: Self-pay

## 2019-07-02 ENCOUNTER — Ambulatory Visit (HOSPITAL_COMMUNITY)
Admission: RE | Admit: 2019-07-02 | Discharge: 2019-07-02 | Disposition: A | Discharge status: Home / Self Care | Payer: Medicare Other | Source: Ambulatory Visit | Attending: Internal Medicine | Admitting: Internal Medicine

## 2019-07-02 DIAGNOSIS — K7460 Unspecified cirrhosis of liver: Secondary | ICD-10-CM

## 2019-07-02 DIAGNOSIS — K769 Liver disease, unspecified: Secondary | ICD-10-CM | POA: Diagnosis not present

## 2019-07-02 IMAGING — MR MR ABDOMEN WO/W CM
16 of 17 series · 45 of 48 positions shown · IV contrast (gadavist)
Comparison: [DATE]

CLINICAL DATA: Nonalcoholic fatty liver disease. Cirrhosis.
Follow-up indeterminate liver lesion.

EXAM:
MRI ABDOMEN WITHOUT AND WITH CONTRAST
TECHNIQUE: Multiplanar multisequence MR imaging of the abdomen was performed
both before and after the administration of intravenous contrast.
CONTRAST:  10mL GADAVIST GADOBUTROL 1 MMOL/ML IV SOLN

[Series 3: T2 · coronal · 6.0mm · 1.76mm/px · 3 of 40 slices shown (1 of 2)]
[im 1/40]
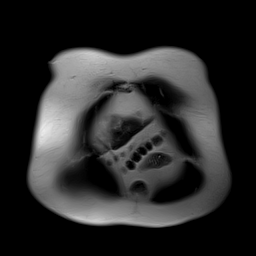
[im 20/40]
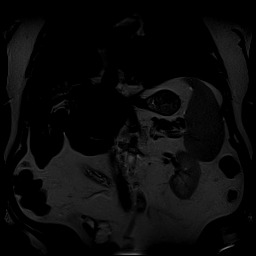
[im 40/40]
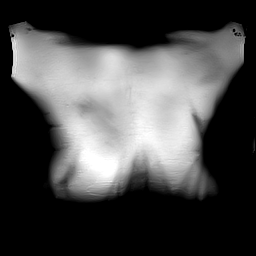

[Series 4: T2 fat-sat · axial · 6.0mm · 1.41mm/px · z∈[-148,+205]mm · 3 of 50 slices shown]
[im 1/50]
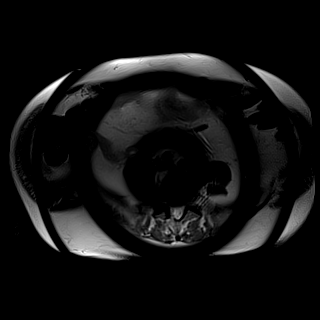
[im 25/50]
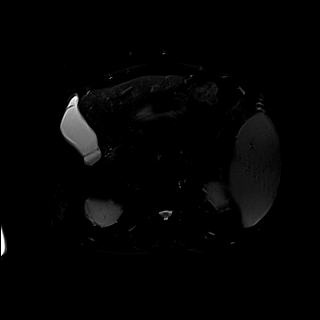
[im 50/50]
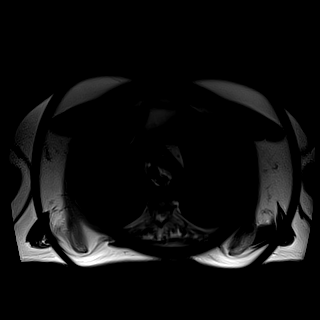

[Series 6: T1 · axial · 4.5mm · 1.41mm/px · z∈[-149,+206]mm · 3 of 80 slices shown (1 of 2)]
[im 1/80]
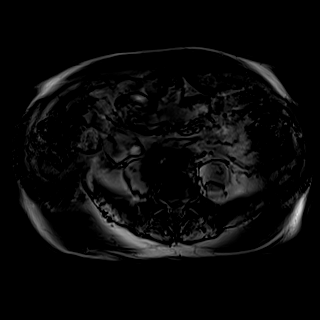
[im 40/80]
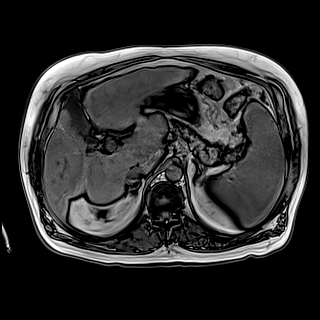
[im 80/80]
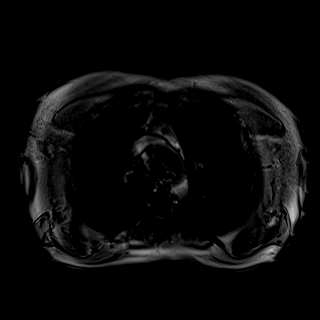

[Series 7: T1 · axial · 4.5mm · 1.41mm/px · z∈[-149,+206]mm · 3 of 80 slices shown (2 of 2)]
[im 1/80]
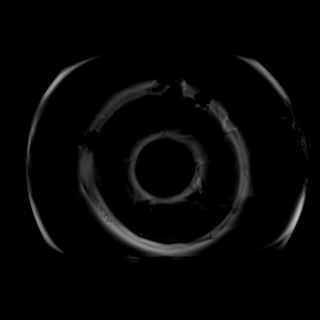
[im 40/80]
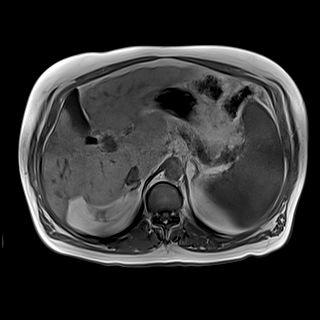
[im 80/80]
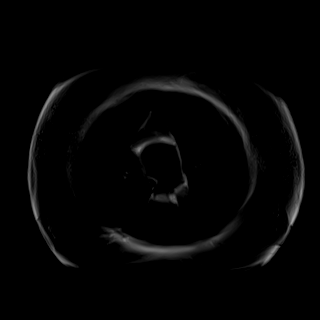

[Series 8: DWI · axial · 6.0mm · 1.68mm/px · z∈[-148,+205]mm · 4 of 100 slices shown (1 of 2)]
[im 1/100]
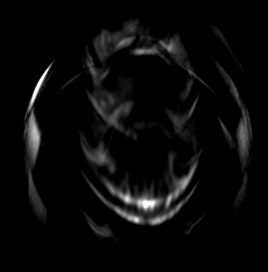
[im 34/100]
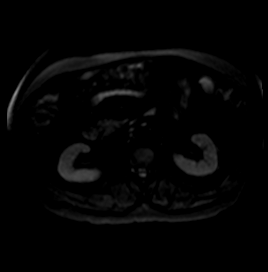
[im 67/100]
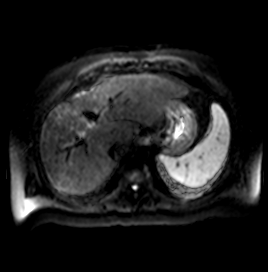
[im 100/100]
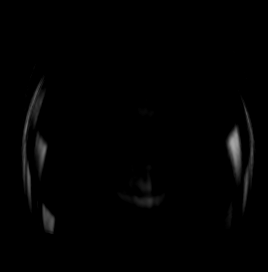

[Series 9: DWI · axial · 6.0mm · 1.68mm/px · z∈[-148,+205]mm · 2 of 50 slices shown (2 of 2)]
[im 1/50]
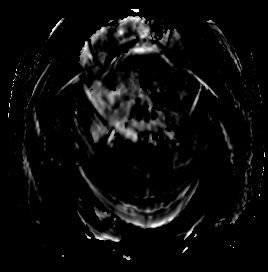
[im 50/50]
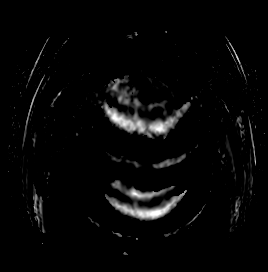

[Series 10: bSSFP · axial · 5.5mm · 0.88mm/px · z∈[-143,+181]mm · 2 of 60 slices shown]
[im 1/60]
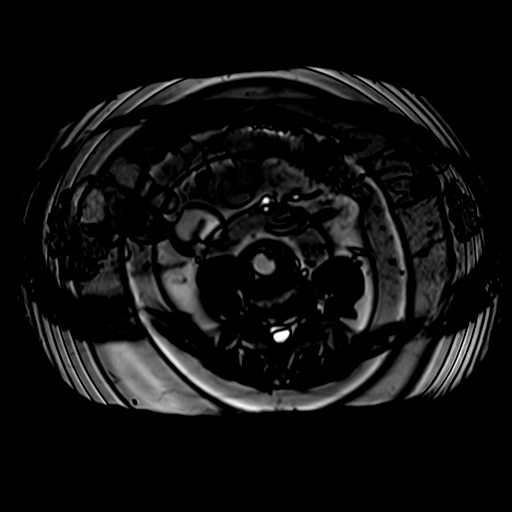
[im 60/60]
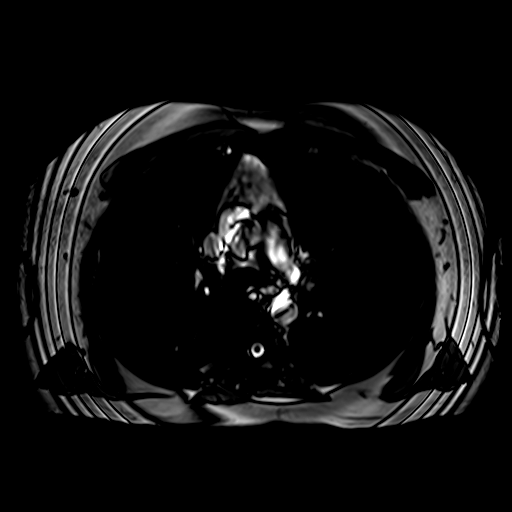

[Series 12: T1 dynamic · axial · 4.0mm · 1.41mm/px · z∈[-175,+173]mm · 3 of 88 slices shown (1 of 6)]
[im 1/88]
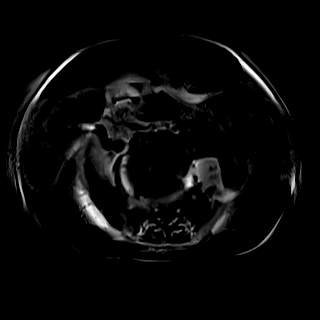
[im 44/88]
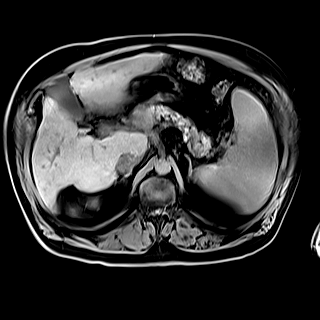
[im 88/88]
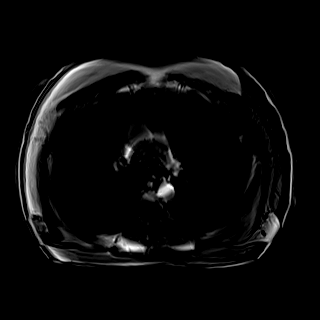

[Series 15: T1 dynamic · axial · 4.0mm · 1.41mm/px · z∈[-175,+173]mm · 3 of 88 slices shown (2 of 6)]
[im 1/88]
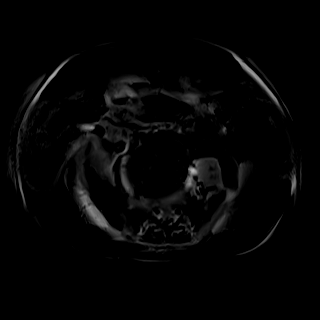
[im 44/88]
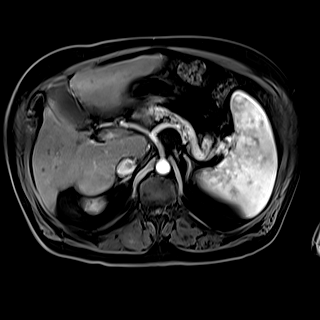
[im 88/88]
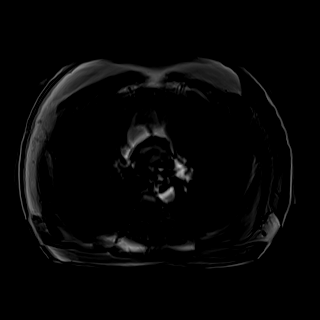

[Series 17: T1 dynamic · axial · 4.0mm · 1.41mm/px · z∈[-175,+173]mm · 3 of 88 slices shown (3 of 6)]
[im 1/88]
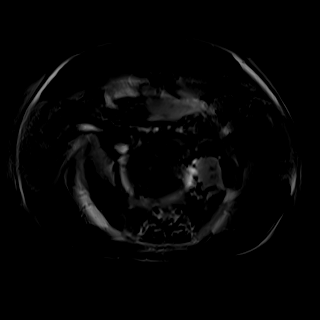
[im 44/88]
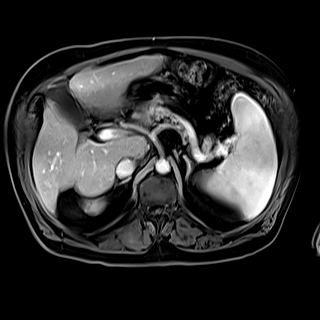
[im 88/88]
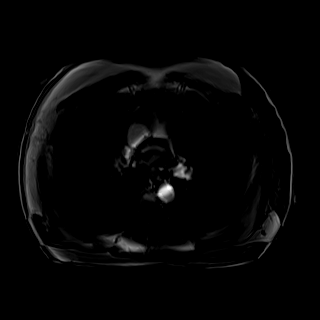

[Series 19: T1 dynamic · axial · 4.0mm · 1.41mm/px · z∈[-175,+173]mm · 3 of 88 slices shown (4 of 6)]
[im 1/88]
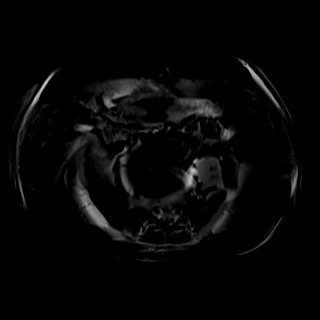
[im 44/88]
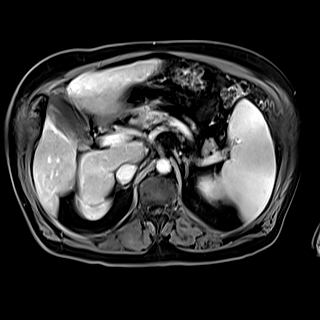
[im 88/88]
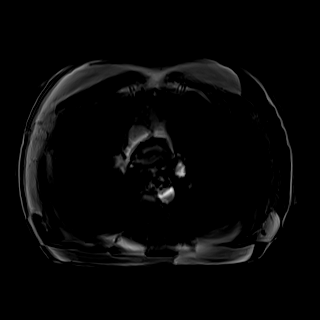

[Series 21: T1 dynamic · coronal · 5.0mm · 1.41mm/px · 2 of 56 slices shown (5 of 6)]
[im 1/56]
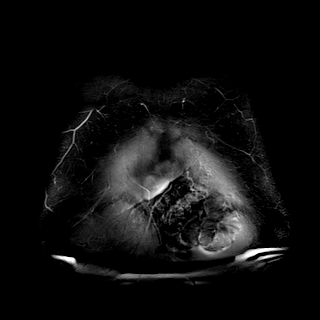
[im 56/56]
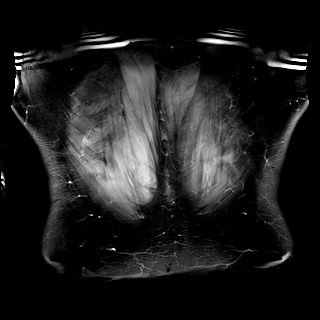

[Series 22: T2 · axial · 6.0mm · 1.76mm/px · z∈[-148,+205]mm · 2 of 50 slices shown (2 of 2)]
[im 1/50]
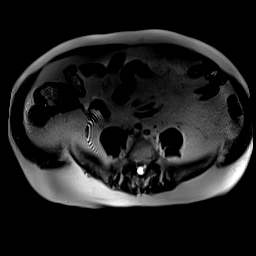
[im 50/50]
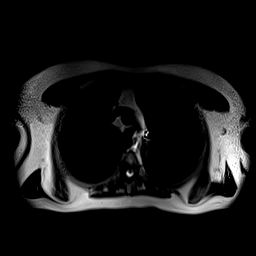

[Series 24: T1 dynamic · axial · 4.0mm · 1.41mm/px · z∈[-175,+173]mm · 3 of 88 slices shown (6 of 6)]
[im 1/88]
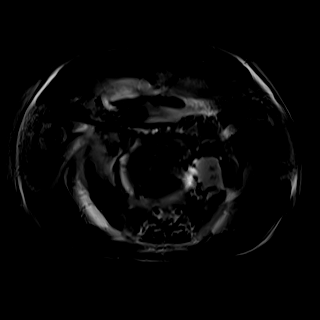
[im 44/88]
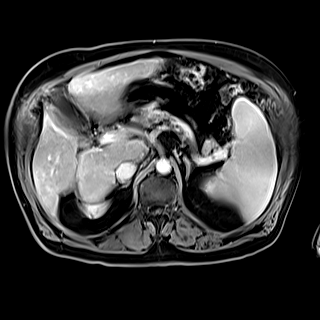
[im 88/88]
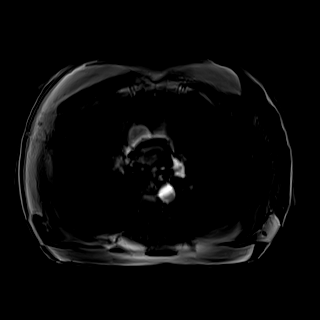

[Series 100: arterial · axial · arterial · 4.0mm · 1.41mm/px · z∈[-175,+173]mm · 3 of 88 slices shown]
[im 1/88]
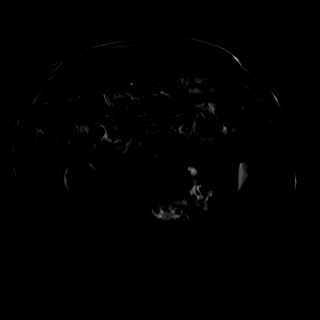
[im 44/88]
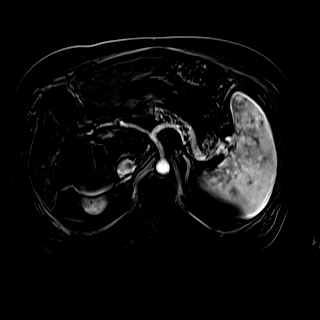
[im 88/88]
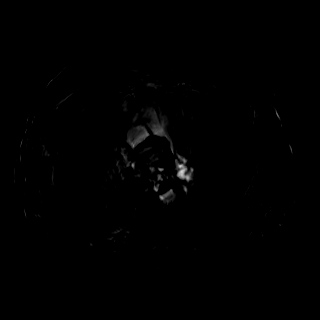

[Series 101: 45 sec · axial · 4.0mm · 1.41mm/px · z∈[-175,+173]mm · 3 of 88 slices shown]
[im 1/88]
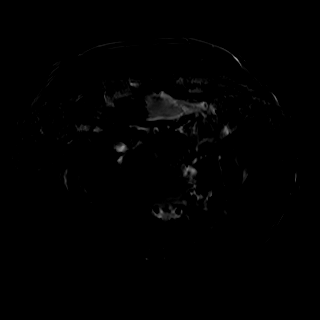
[im 44/88]
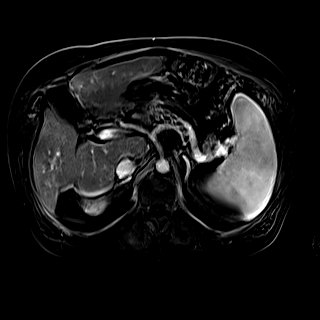
[im 88/88]
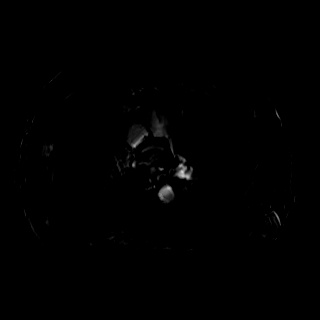

[45 of 48 positions shown; findings below may reference images not displayed]

FINDINGS: Lower chest: No acute findings.

Hepatobiliary: Hepatic cirrhosis again demonstrated. A solitary
focus hyperenhancement is again seen in segment 6 which measures 10
mm on image 46/17. This lesion shows no evidence of contrast washout
and is T2 isointense, and remains stable in size since previous
study. No other suspicious liver lesions identified. Gallbladder is
unremarkable. No evidence of biliary ductal dilatation.

Pancreas:  No mass or inflammatory changes.

Spleen: Stable mild splenomegaly with length measuring approximately
15 cm. No masses identified.

Adrenals/Urinary Tract: No masses identified. A few tiny sub-cm
cysts are again seen in the upper pole of the left kidney. No
evidence of hydronephrosis.

Stomach/Bowel: Visualized portion unremarkable.

Vascular/Lymphatic: No pathologically enlarged lymph nodes
identified. No abdominal aortic aneurysm.

Other:  None.

Musculoskeletal:  No suspicious bone lesions identified.
IMPRESSION: 1. Hepatic cirrhosis. Stable 10 mm lesion showing arterial phase
hyperenhancement in segment 6 of the liver, which remains stable in
size since previous study. Recommend continued followup by abdomen
MRI without and with contrast in 6 months. (AZAIRE Category 3:
Intermediate probability of malignancy)
2. Stable mild splenomegaly.  No evidence of ascites.

## 2019-07-02 MED ORDER — GADOBUTROL 1 MMOL/ML IV SOLN
10.0000 mL | Freq: Once | INTRAVENOUS | Status: AC | PRN
  Administered 2019-07-02: 10 mL via INTRAVENOUS

## 2019-07-03 ENCOUNTER — Encounter: Payer: Self-pay | Admitting: Internal Medicine

## 2019-09-24 ENCOUNTER — Other Ambulatory Visit: Payer: Self-pay | Admitting: Family Medicine

## 2019-10-05 DIAGNOSIS — Z20828 Contact with and (suspected) exposure to other viral communicable diseases: Secondary | ICD-10-CM | POA: Diagnosis not present

## 2019-11-22 ENCOUNTER — Other Ambulatory Visit: Payer: Self-pay | Admitting: Family Medicine

## 2019-12-29 ENCOUNTER — Other Ambulatory Visit: Payer: Self-pay | Admitting: Family Medicine

## 2020-01-04 ENCOUNTER — Telehealth: Payer: Self-pay

## 2020-01-04 ENCOUNTER — Other Ambulatory Visit: Payer: Self-pay

## 2020-01-04 DIAGNOSIS — K769 Liver disease, unspecified: Secondary | ICD-10-CM

## 2020-01-04 NOTE — Telephone Encounter (Signed)
Pt scheduled for MR of abd at [facility] 01/19/20@10am , pt to arrive there at 9:30am and be NPO 4 hours prior to scan.

## 2020-01-04 NOTE — Telephone Encounter (Signed)
-----   Message from Algernon Huxley, RN sent at 07/03/2019  1:42 PM EDT ----- Regarding: MRI abd Needs mri abd w and w/o

## 2020-01-04 NOTE — Telephone Encounter (Signed)
Spoke with pt and he is aware. Pt states he would like an earlier appt. Pt given the phone number [phone] to call and reschedule the appt to an earlier time.

## 2020-01-14 ENCOUNTER — Telehealth: Payer: Self-pay | Admitting: Internal Medicine

## 2020-01-14 NOTE — Telephone Encounter (Signed)
Script sent to pharmacy and pt aware.

## 2020-01-14 NOTE — Telephone Encounter (Signed)
Ok to repeat: Lorazepam 1 mg  PO 1 hour before MRI This is a sedative and he will need transportation to and from his appointment and should not drive a car until the next day

## 2020-01-14 NOTE — Telephone Encounter (Signed)
Pt requesting something to help him relax for MR. States what he got last time worked well. See below:  Jerene Bears, MD to Me     06/29/19 11:56 AM Note Lorazepam 1 mg  PO 1 hour before MRI This is a sedative and he will need transportation to and from his appointment and should not drive a car until the next day     Please advise.

## 2020-01-19 ENCOUNTER — Ambulatory Visit (HOSPITAL_COMMUNITY)
Admission: RE | Admit: 2020-01-19 | Discharge: 2020-01-19 | Disposition: A | Discharge status: Home / Self Care | Payer: Medicare Other | Source: Ambulatory Visit | Attending: Internal Medicine | Admitting: Internal Medicine

## 2020-01-19 ENCOUNTER — Other Ambulatory Visit: Payer: Self-pay

## 2020-01-19 ENCOUNTER — Ambulatory Visit (HOSPITAL_COMMUNITY): Payer: Medicare Other

## 2020-01-19 DIAGNOSIS — R161 Splenomegaly, not elsewhere classified: Secondary | ICD-10-CM | POA: Diagnosis not present

## 2020-01-19 DIAGNOSIS — K7689 Other specified diseases of liver: Secondary | ICD-10-CM | POA: Diagnosis not present

## 2020-01-19 DIAGNOSIS — K769 Liver disease, unspecified: Secondary | ICD-10-CM | POA: Diagnosis not present

## 2020-01-19 DIAGNOSIS — K7460 Unspecified cirrhosis of liver: Secondary | ICD-10-CM | POA: Diagnosis not present

## 2020-01-19 IMAGING — MR MR ABDOMEN WO/W CM
18 of 19 series · 47 of 48 positions shown · IV contrast (gadavist)
Comparison: [DATE], [DATE]

CLINICAL DATA: Follow-up liver lesion, cirrhosis

EXAM:
MRI ABDOMEN WITHOUT AND WITH CONTRAST
TECHNIQUE: Multiplanar multisequence MR imaging of the abdomen was performed
both before and after the administration of intravenous contrast.
CONTRAST:  10mL GADAVIST GADOBUTROL 1 MMOL/ML IV SOLN

[Series 2: haste_cor_mbh · coronal · 6.0mm · 1.76mm/px · 1 of 47 slices shown]
[im 1/47]
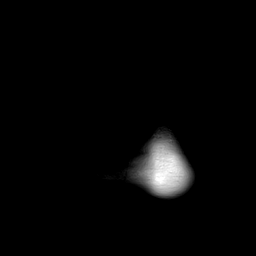

[Series 3: ax_trufi_mbh · axial · 6.0mm · 1.17mm/px · z∈[-171,+123]mm · 2 of 50 slices shown]
[im 1/50]
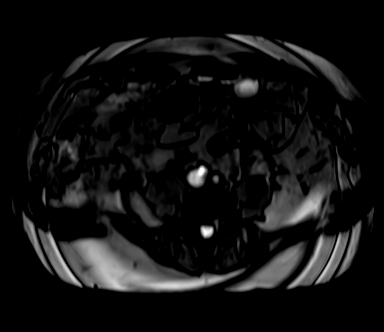
[im 50/50]
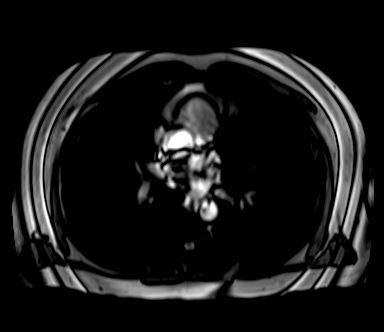

[Series 5: T2 fat-sat · axial · 6.0mm · 1.41mm/px · 1 of 46 slices shown]
[im 1/46]
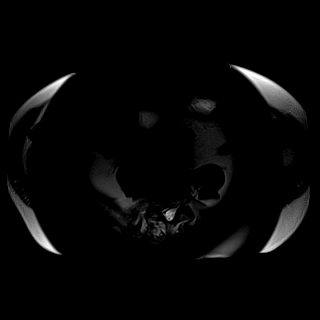

[Series 6: ax_diff_fb_tracew_dfc_mix · axial · 6.0mm · 1.68mm/px · z∈[-190,+148]mm · 3 of 96 slices shown]
[im 1/96]
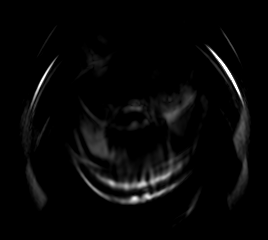
[im 48/96]
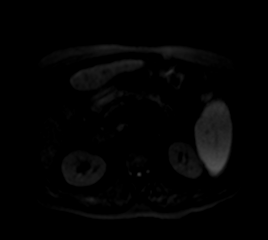
[im 96/96]
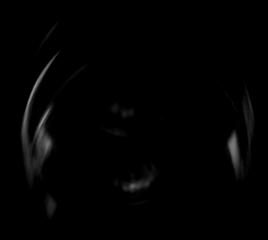

[Series 7: ax_diff_fb_adc_dfc_mix · axial · 6.0mm · 1.68mm/px · z∈[-190,+148]mm · 2 of 48 slices shown]
[im 1/48]
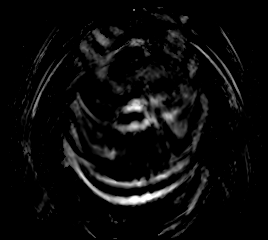
[im 48/48]
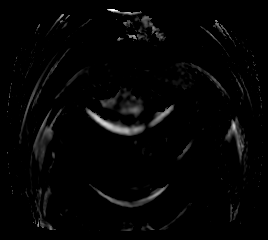

[Series 8: t1_vibe_e-dixon_tra_bh_pre_opp · axial · 3.0mm · 2.34mm/px · z∈[-163,+122]mm · 3 of 96 slices shown]
[im 1/96]
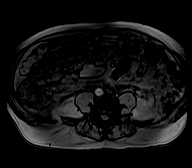
[im 48/96]
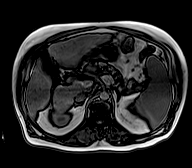
[im 96/96]
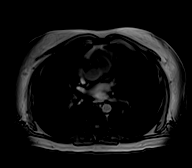

[Series 9: t1_vibe_e-dixon_tra_bh_pre_in · axial · 3.0mm · 2.34mm/px · z∈[-163,+122]mm · 3 of 96 slices shown]
[im 1/96]
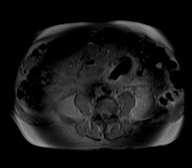
[im 48/96]
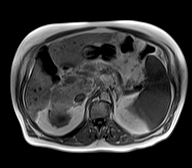
[im 96/96]
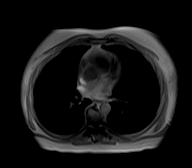

[Series 18: t1_vibe_e-dixon_tra_bh_pre_w_reg · axial · 3.0mm · 2.34mm/px · z∈[-163,+122]mm · 3 of 96 slices shown]
[im 1/96]
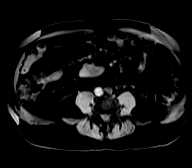
[im 48/96]
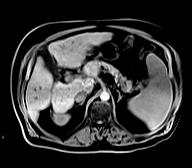
[im 96/96]
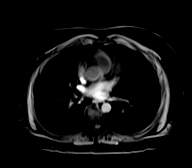

[Series 19: t1_vibe_dixon_tra_bh_arterial_w_reg · axial · 3.0mm · 2.34mm/px · z∈[-163,+122]mm · 3 of 96 slices shown]
[im 1/96]
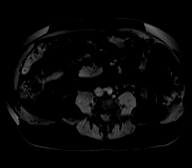
[im 48/96]
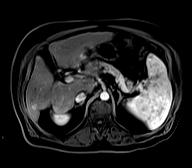
[im 96/96]
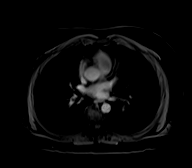

[Series 20: t1_vibe_dixon_tra_bh_arterial_w_sub · axial · 3.0mm · 2.34mm/px · z∈[-163,+122]mm · 3 of 96 slices shown]
[im 1/96]
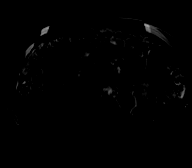
[im 48/96]
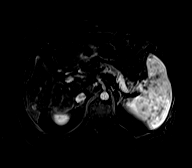
[im 96/96]
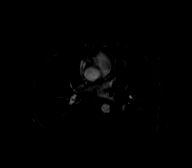

[Series 21: t1_vibe_dixon_tra_bh_venous_w_reg · axial · 3.0mm · 2.34mm/px · z∈[-163,+122]mm · 3 of 96 slices shown]
[im 1/96]
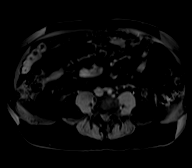
[im 48/96]
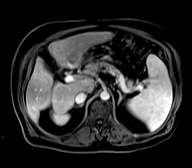
[im 96/96]
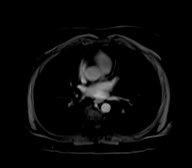

[Series 22: t1_vibe_dixon_tra_bh_venous_w_r_sub · axial · 3.0mm · 2.34mm/px · z∈[-163,+122]mm · 3 of 96 slices shown]
[im 1/96]
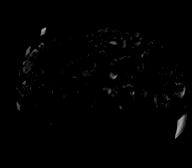
[im 48/96]
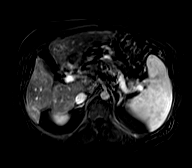
[im 96/96]
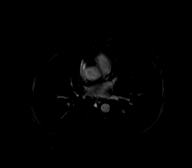

[Series 23: T2 · axial · 6.0mm · 1.76mm/px · 1 of 43 slices shown]
[im 1/43]
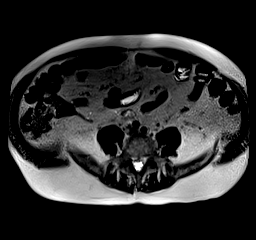

[Series 24: t1_vibe_dixon_tra_bh_delayed_w_reg · axial · 3.0mm · 2.34mm/px · z∈[-163,+122]mm · 3 of 96 slices shown]
[im 1/96]
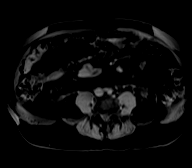
[im 48/96]
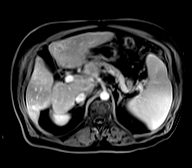
[im 96/96]
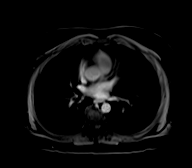

[Series 25: t1_vibe_dixon_tra_bh_delayed_w__sub · axial · 3.0mm · 2.34mm/px · z∈[-163,+122]mm · 3 of 96 slices shown]
[im 1/96]
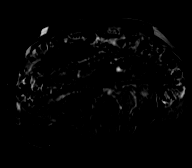
[im 48/96]
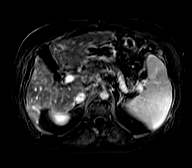
[im 96/96]
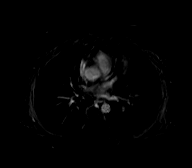

[Series 27: t1_vibe_dixon_cor_bh_post_w · coronal · 3.0mm · 2.34mm/px · 4 of 120 slices shown]
[im 1/120]
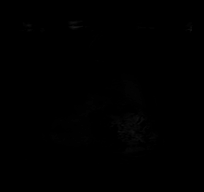
[im 40/120]
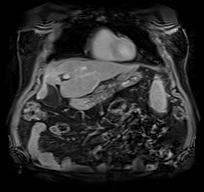
[im 80/120]
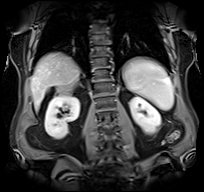
[im 120/120]
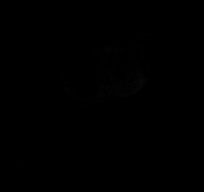

[Series 28: t1_vibe_dixon_tra_bh_3 min_w_reg · axial · 3.0mm · 2.34mm/px · z∈[-163,+122]mm · 3 of 96 slices shown]
[im 1/96]
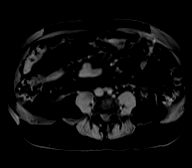
[im 48/96]
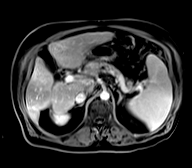
[im 96/96]
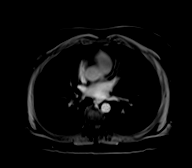

[Series 29: t1_vibe_dixon_tra_bh_3 min_w_re_sub · axial · 3.0mm · 2.34mm/px · z∈[-163,+122]mm · 3 of 96 slices shown]
[im 1/96]
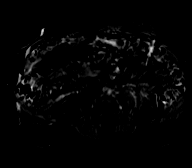
[im 48/96]
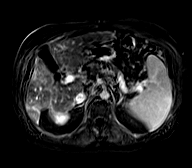
[im 96/96]
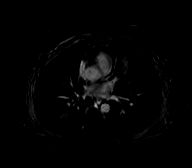

[47 of 48 positions shown; findings below may reference images not displayed]

FINDINGS: Lower chest: No acute findings.

Hepatobiliary: Coarse, nodular, cirrhotic morphology of the liver
with hypertrophy of the caudate. Significant interval enlargement
and increase in arterial hyperenhancement of a lesion in the
posteror inferior right lobe of the liver, hepatic segment VI,
measuring now measuring 1.5 cm, previously 1.0 cm (series 19, image
48). There is no evidence of washout or capsular enhancement. There
are occasional additional punctuate foci of arterial
hyperenhancement in the right lobe of the liver, for example in the
peripheral posterior liver dome measuring no greater than 4 mm
(series 19, image 35).

Pancreas: No mass, inflammatory changes, or other parenchymal
abnormality identified.

Spleen:  Unchanged splenomegaly, maximum coronal span 16.3 cm.

Adrenals/Urinary Tract: No masses identified. No evidence of
hydronephrosis.

Stomach/Bowel: Visualized portions within the abdomen are
unremarkable.

Vascular/Lymphatic: No pathologically enlarged lymph nodes
identified. No abdominal aortic aneurysm demonstrated.

Other:  None.

Musculoskeletal: No suspicious bone lesions identified.
IMPRESSION: 1. Significant interval enlargement and increase in arterial
hyperenhancement of a lesion in the posteror inferior right lobe of
the liver, hepatic segment VI, now measuring 1.5 cm, previously
cm. There is no evidence of washout or capsular enhancement. Given
interval threshold growth, this lesion meets criteria for BENRABAH
category 5.
2. There are occasional additional punctuate foci of arterial
hyperenhancement in the right lobe of the liver, no greater in size
than 4 mm. These are nonspecific and may reflect dysplastic
nodularity. BENRABAH category 3. Attention on follow-up.
3. Cirrhosis and splenomegaly.

## 2020-01-19 MED ORDER — GADOBUTROL 1 MMOL/ML IV SOLN
10.0000 mL | Freq: Once | INTRAVENOUS | Status: AC | PRN
  Administered 2020-01-19: 10 mL via INTRAVENOUS

## 2020-01-20 ENCOUNTER — Other Ambulatory Visit: Payer: Self-pay

## 2020-01-20 DIAGNOSIS — K7460 Unspecified cirrhosis of liver: Secondary | ICD-10-CM

## 2020-01-20 DIAGNOSIS — K769 Liver disease, unspecified: Secondary | ICD-10-CM

## 2020-01-20 DIAGNOSIS — C229 Malignant neoplasm of liver, not specified as primary or secondary: Secondary | ICD-10-CM

## 2020-01-21 NOTE — Progress Notes (Signed)
Patient returned my call to acknowledge and confirm his scheduled consult with Dr. Julieanne Manson for this Monday 12/13 at 2 pm.  He knows to arrive at least 15 minutes prior for registration and that he can bring one person with him to the consult.  He also knows valet parking is available.

## 2020-01-21 NOTE — Progress Notes (Signed)
I have left two voice messages for patient regarding referral we received from Dr. Hilarie Fredrickson.  I have tentatively scheduled him for this Monday 01/25/2020 at 2 pm with Dr. Julieanne Manson and I have asked that he call me back on my direct number to confirm.

## 2020-01-25 ENCOUNTER — Inpatient Hospital Stay: Payer: Medicare Other | Attending: Oncology | Admitting: Oncology

## 2020-01-25 ENCOUNTER — Other Ambulatory Visit (INDEPENDENT_AMBULATORY_CARE_PROVIDER_SITE_OTHER): Payer: Medicare Other

## 2020-01-25 ENCOUNTER — Other Ambulatory Visit: Payer: Self-pay

## 2020-01-25 DIAGNOSIS — Z808 Family history of malignant neoplasm of other organs or systems: Secondary | ICD-10-CM

## 2020-01-25 DIAGNOSIS — C220 Liver cell carcinoma: Secondary | ICD-10-CM | POA: Insufficient documentation

## 2020-01-25 DIAGNOSIS — K7460 Unspecified cirrhosis of liver: Secondary | ICD-10-CM | POA: Diagnosis not present

## 2020-01-25 DIAGNOSIS — Z803 Family history of malignant neoplasm of breast: Secondary | ICD-10-CM | POA: Diagnosis not present

## 2020-01-25 DIAGNOSIS — K769 Liver disease, unspecified: Secondary | ICD-10-CM

## 2020-01-25 DIAGNOSIS — K900 Celiac disease: Secondary | ICD-10-CM

## 2020-01-25 DIAGNOSIS — C229 Malignant neoplasm of liver, not specified as primary or secondary: Secondary | ICD-10-CM

## 2020-01-25 LAB — CBC WITH DIFFERENTIAL/PLATELET
Basophils Absolute: 0 10*3/uL (ref 0.0–0.1)
Basophils Relative: 0.8 % (ref 0.0–3.0)
Eosinophils Absolute: 0.1 10*3/uL (ref 0.0–0.7)
Eosinophils Relative: 2.6 % (ref 0.0–5.0)
HCT: 41.3 % (ref 39.0–52.0)
Hemoglobin: 14.6 g/dL (ref 13.0–17.0)
Lymphocytes Relative: 29.9 % (ref 12.0–46.0)
Lymphs Abs: 1.2 10*3/uL (ref 0.7–4.0)
MCHC: 35.5 g/dL (ref 30.0–36.0)
MCV: 89.5 fl (ref 78.0–100.0)
Monocytes Absolute: 0.5 10*3/uL (ref 0.1–1.0)
Monocytes Relative: 10.9 % (ref 3.0–12.0)
Neutro Abs: 2.3 10*3/uL (ref 1.4–7.7)
Neutrophils Relative %: 55.8 % (ref 43.0–77.0)
Platelets: 117 10*3/uL — ABNORMAL LOW (ref 150.0–400.0)
RBC: 4.61 Mil/uL (ref 4.22–5.81)
RDW: 13.4 % (ref 11.5–15.5)
WBC: 4.2 10*3/uL (ref 4.0–10.5)

## 2020-01-25 LAB — COMPREHENSIVE METABOLIC PANEL
ALT: 107 U/L — ABNORMAL HIGH (ref 0–53)
AST: 62 U/L — ABNORMAL HIGH (ref 0–37)
Albumin: 4.2 g/dL (ref 3.5–5.2)
Alkaline Phosphatase: 59 U/L (ref 39–117)
BUN: 12 mg/dL (ref 6–23)
CO2: 28 mEq/L (ref 19–32)
Calcium: 9.1 mg/dL (ref 8.4–10.5)
Chloride: 102 mEq/L (ref 96–112)
Creatinine, Ser: 0.73 mg/dL (ref 0.40–1.50)
GFR: 94.23 mL/min (ref >60.00)
Glucose, Bld: 139 mg/dL — ABNORMAL HIGH (ref 70–99)
Potassium: 4 mEq/L (ref 3.5–5.1)
Sodium: 137 mEq/L (ref 135–145)
Total Bilirubin: 1.1 mg/dL (ref 0.2–1.2)
Total Protein: 7.7 g/dL (ref 6.0–8.3)

## 2020-01-25 LAB — PROTIME-INR
INR: 1.2 ratio — ABNORMAL HIGH (ref 0.8–1.0)
Prothrombin Time: 12.9 s (ref 9.6–13.1)

## 2020-01-25 NOTE — Progress Notes (Signed)
[facility] Patient Consult   Requesting MD: Susy Frizzle, [place] [address] [place],  [place] [address]   Joel Reed. 69 y.o.  Oct 08, 1950    Reason for Consult: Hepatocellular carcinoma   HPI: Joel Reed has a history of steatohepatitis/cirrhosis and is followed by Dr. Hilarie Fredrickson.  A surveillance MRI on 03/18/2019 revealed cirrhosis with a 0.9 cm area of arterial phase enhancement in segment 6 considered LR-3.  No other liver lesions.  Tiny gastroesophageal and periumbilical varices.  He was referred for a follow-up MRI on 01/19/2020.  There is interval enlargement of the segment 6 lesion now measuring 1.5 cm, LR-5.  Occasional additional punctate foci of arterial hyperenhancement are noted in the right liver, no greater than 4 mm, LR-3.  Unchanged bladder megaly.  No pathologic enlarged lymph nodes.  Joel Reed reports feeling well.  No complaint.  Past Medical History:  Diagnosis Date  . Allergy   . Celiac disease   . Cirrhosis ([facility])   . Diabetes mellitus without complication ([facility])   . Gastritis and duodenitis    on egd 12/2017  . Hyperlipidemia    Patient denies  . Hypertension   . IDA (iron deficiency anemia)   . Internal hemorrhoids   . Nephrolithiasis   . 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  . Right buttock pain 07/29/2018  . Sleep apnea   . Splenomegaly   . Tubular adenoma of colon     .  Admission May 2020 with Streptococcus group G bacteremia   .  Right posterior tibial and peroneal DVT on ultrasound November 2019, age-indeterminate Past Surgical History:  Procedure Laterality Date  . COLONOSCOPY    . JOINT REPLACEMENT     left hip Dr Noemi Chapel  . SPINE SURGERY-L5   1986   disectomy    Medications: Reviewed  Allergies:  Allergies  Allergen Reactions  . Gluten Meal     Family history: His father had head neck cancer.  A sister died of breast cancer at age 71.  Social History:   He lives  with his wife in [place].  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  He does not use cigarettes.  No alcohol.  He reports social alcohol use in the past.  No risk factor for HIV or hepatitis.  No transfusion history.  ROS:   Positives include: Left hand "muscle "pain after straining the left fifth finger  A complete ROS was otherwise negative.  Physical Exam:  Blood pressure (!) 154/69, pulse 71, temperature 99.7 F (37.6 C), temperature source Tympanic, resp. rate 17, height 6' (1.829 m), weight 256 lb 6.4 oz (116.3 kg), SpO2 100 %.  HEENT: Neck without mass Lungs: Clear bilaterally Cardiac: Regular rate and rhythm Abdomen: No mass, nontender, no hepatosplenomegaly GU: Testes without mass Vascular: Chronic stasis change at the lower leg bilaterally Lymph nodes: No cervical, supraclavicular, axillary, or inguinal nodes Neurologic: Alert and oriented, the motor exam appears intact in the upper and lower extremities bilaterally Skin: Dry skin over the back Musculoskeletal: No spine tenderness   LAB:  CBC  Lab Results  Component Value Date   WBC 4.8 11/13/2018   HGB 15.0 11/13/2018   HCT 43.5 11/13/2018   MCV 87.8 11/13/2018   PLT 123.0 (L) 11/13/2018   NEUTROABS 2.7 11/13/2018        CMP  Lab Results  Component Value Date  NA 137 11/13/2018   K 4.1 11/13/2018   CL 101 11/13/2018   CO2 28 11/13/2018   GLUCOSE 157 (H) 11/13/2018   BUN 14 03/12/2019   CREATININE 0.81 03/12/2019   CALCIUM 9.7 11/13/2018   PROT 8.0 11/13/2018   ALBUMIN 4.6 11/13/2018   AST 66 (H) 11/13/2018   ALT 124 (H) 11/13/2018   ALKPHOS 64 11/13/2018   BILITOT 0.8 11/13/2018   GFRNONAA >60 03/12/2019   GFRAA >60 03/12/2019      Imaging: As per HPI, MRI images from 01/19/2020 reviewed with Joel. Reed and his wife     Assessment/Plan:   1. Hepatocellular carcinoma  MRI 01/19/2020-interval enlargement and increase in arterial hyperenhancement of a 1.5 cm segment 6  lesion, LI-RADS 5, additional punctate foci of arterial hyperenhancement in the right liver, none greater than 4 mm, nonspecific, LI-RADS 3, cirrhosis, splenomegaly 2. Cirrhosis-nonalcoholic fatty liver disease, well compensated     MRI abdomen 03/18/2019-hepatic cirrhosis, indistinct 0.9 cm focus of arterial hyperenhancement in segment 6- LI RADS 3, mild splenomegaly, tiny gastroesophageal and periumbilical varices 3.   Celiac disease  4.    History of iron deficiency anemia secondary to #3   Disposition:   Joel Reed has been diagnosed with hepatocellular carcinoma.  The radiologic characteristics of the right liver lesion in the setting of cirrhosis is diagnostic of hepatocellular carcinoma.  I do not recommend a diagnostic biopsy.  He appears to have well compensated cirrhosis.  He appears to be a candidate for hepatic directed therapy including liver resection, ablation, and embolization therapy.  He will be a candidate for systemic therapy if he develops disease progression following hepatic directed therapy.  I will present his case at the GI tumor conference within the next few weeks.  We will make a referral to surgery or interventional radiology depending on the conference discussion.  He is scheduled for labs later today including a CBC, CMP, PT/INR, and AFP.  He will be scheduled for an office visit in 6 months.  I will see him sooner as needed.  Betsy Coder, MD  01/25/2020, 2:15 PM

## 2020-01-25 NOTE — Progress Notes (Signed)
Met with patient and his wife Joycelyn Schmid today at initial medical oncology consult with Dr. Julieanne Manson.  I explained my role as nurse navigator and they were given my card with my direct contact information. They verbalized an understanding of the plan to present his case at our GI Conference on 12/22 and based on that recommendation will refer to surgery or IR.  The patient knows he will be notified of our board's recommendations.

## 2020-01-26 ENCOUNTER — Telehealth: Payer: Self-pay | Admitting: Oncology

## 2020-01-26 DIAGNOSIS — K7469 Other cirrhosis of liver: Secondary | ICD-10-CM | POA: Diagnosis not present

## 2020-01-26 DIAGNOSIS — K766 Portal hypertension: Secondary | ICD-10-CM | POA: Diagnosis not present

## 2020-01-26 LAB — AFP TUMOR MARKER: AFP-Tumor Marker: 7.5 ng/mL — ABNORMAL HIGH (ref <6.1)

## 2020-01-26 NOTE — Telephone Encounter (Signed)
Scheduled appointment per 12/13 los. Spoke to patient who is aware of appointment date and time.

## 2020-02-03 ENCOUNTER — Other Ambulatory Visit: Payer: Self-pay

## 2020-02-03 NOTE — Progress Notes (Signed)
Spoke with patient regarding recommendation of our GI conference discussion this morning being referral to [facility] for consideration of possible liver transplant.  I explained to him that I would be faxing over the referral today and should this not be a consideration we can certainly refer to Interventional Radiology for hepatic directed therapies.  The patient has verbalized an understanding.  I have faxed the referral - a total of 21 pages to [facility] at fax [phone].  This included patient demographics, insurance cards, Dr. Benay Spice OV note and imaging reports.  I have given them my direct contact number should they need further information.

## 2020-02-03 NOTE — Progress Notes (Signed)
The proposed treatment discussed in conference is for discussion purposes only and is not a binding recommendation.  The patients have not been physically examined, or presented with their treatment options.  Therefore, final treatment plans cannot be decided.

## 2020-02-09 NOTE — Progress Notes (Signed)
Patient's wife Joycelyn Schmid called with questions regarding referral to [facility].  I explained to her the referral was sent on 12/22 to [facility] for consideration of liver transplant.  They have not heard from them as yet.  I explained that there may be a delay due to the holidays. I provided their direct number and instructed her to have her husband call if he has not heard from them by the beginning of next week.  Also if I can be of any assistance she will call me back.  She verbalized an understanding.

## 2020-02-18 ENCOUNTER — Telehealth: Payer: Self-pay

## 2020-02-18 NOTE — Telephone Encounter (Signed)
I received a call from patient stating he has not heard anything from [facility] about an appointment.  I called [facility] and they state they did receive the referral and there was a note in the chart they had left a message for the patient to return the call.  I was given their direct number [phone].  I have instructed him to call this number to set up his appointment.

## 2020-02-19 DIAGNOSIS — Z23 Encounter for immunization: Secondary | ICD-10-CM | POA: Diagnosis not present

## 2020-02-24 DIAGNOSIS — E669 Obesity, unspecified: Secondary | ICD-10-CM | POA: Diagnosis not present

## 2020-02-24 DIAGNOSIS — D126 Benign neoplasm of colon, unspecified: Secondary | ICD-10-CM | POA: Diagnosis not present

## 2020-02-24 DIAGNOSIS — K7581 Nonalcoholic steatohepatitis (NASH): Secondary | ICD-10-CM | POA: Diagnosis not present

## 2020-02-24 DIAGNOSIS — K2980 Duodenitis without bleeding: Secondary | ICD-10-CM | POA: Diagnosis not present

## 2020-02-24 DIAGNOSIS — Z79899 Other long term (current) drug therapy: Secondary | ICD-10-CM | POA: Diagnosis not present

## 2020-02-24 DIAGNOSIS — K7460 Unspecified cirrhosis of liver: Secondary | ICD-10-CM | POA: Diagnosis not present

## 2020-02-24 DIAGNOSIS — C220 Liver cell carcinoma: Secondary | ICD-10-CM | POA: Diagnosis not present

## 2020-02-24 DIAGNOSIS — E119 Type 2 diabetes mellitus without complications: Secondary | ICD-10-CM | POA: Diagnosis not present

## 2020-02-24 DIAGNOSIS — I10 Essential (primary) hypertension: Secondary | ICD-10-CM | POA: Diagnosis not present

## 2020-02-24 DIAGNOSIS — Z6834 Body mass index (BMI) 34.0-34.9, adult: Secondary | ICD-10-CM | POA: Diagnosis not present

## 2020-02-24 DIAGNOSIS — K2970 Gastritis, unspecified, without bleeding: Secondary | ICD-10-CM | POA: Diagnosis not present

## 2020-02-24 DIAGNOSIS — R162 Hepatomegaly with splenomegaly, not elsewhere classified: Secondary | ICD-10-CM | POA: Diagnosis not present

## 2020-02-24 DIAGNOSIS — Z1159 Encounter for screening for other viral diseases: Secondary | ICD-10-CM | POA: Diagnosis not present

## 2020-02-24 DIAGNOSIS — E785 Hyperlipidemia, unspecified: Secondary | ICD-10-CM | POA: Diagnosis not present

## 2020-02-24 DIAGNOSIS — Z7984 Long term (current) use of oral hypoglycemic drugs: Secondary | ICD-10-CM | POA: Diagnosis not present

## 2020-03-03 ENCOUNTER — Telehealth: Payer: Self-pay | Admitting: Internal Medicine

## 2020-03-03 ENCOUNTER — Telehealth: Payer: Self-pay | Admitting: *Deleted

## 2020-03-03 NOTE — Telephone Encounter (Signed)
Patient was seen by Dr. Rayvon Char at [facility] hepatology given concern for [facility] seen by MRI locally.  This was performed for hepatocellular carcinoma screening in the setting of known cirrhosis.  I received communication back from Dr. Drue Novel and his case has been presented at the multidisciplinary hepatocellular carcinoma tumor board as recently as yesterday.  The findings were as follows:  "NASH cirrhosis, CTP A, with 1.5 cm segment 6 lesion.  Looks stable on current MRI dated 12/21 in comparison from imaging in both February 2021 in May 2021.  Compatible with an LR-3 lesion.  Plan: Repeat MRI in 3 to 6 months.  Spoke to patient and wife regarding the above results.  Based on current MRI and 2 prior MRIs, lesion did not meet HCC criteria (LI-RADS 5) and is felt to be a dysplastic nodule (LI-RADS 3).  I do like for him to have a follow-up MRI scan at [facility] in about 4 months with subtraction images.  Patient is agreeable to the plan."  This is great news for the patient  He is due for variceal screening and we will proceed with upper endoscopy.

## 2020-03-03 NOTE — Telephone Encounter (Signed)
Dr Hilarie Fredrickson has received 02/24/20 notes from [facility].   Dr Hilarie Fredrickson notes, "03/01/20-Note reviewed. Okay to scheduled EGD for variceal screening in [place]"  Patient is currently scheduled for office follow up with Dr Hilarie Fredrickson on 04/06/20. However, Dr Hilarie Fredrickson has indicated that since reviewing note from [facility] hepatology, patient may go forward with endoscopy and hold off on office visit until after endoscopy.  I have spoken to patient who is agreeable to scheduling endoscopy on 03/17/20 at 10:00 am and previsit on 03/14/20 at 9:00 am. He will need COVID testing as well on 03/14/20 at 10 am as he has only had one COVID vaccination so far (covid screen already scheduled). Patient verbalizes understanding of times/dates/locations of upcoming appointments.

## 2020-03-07 ENCOUNTER — Other Ambulatory Visit: Payer: Self-pay | Admitting: Family Medicine

## 2020-03-14 ENCOUNTER — Other Ambulatory Visit: Payer: Self-pay | Admitting: Internal Medicine

## 2020-03-14 ENCOUNTER — Other Ambulatory Visit: Payer: Self-pay

## 2020-03-14 ENCOUNTER — Ambulatory Visit (AMBULATORY_SURGERY_CENTER): Payer: Self-pay

## 2020-03-14 VITALS — Ht 72.0 in | Wt 252.8 lb

## 2020-03-14 DIAGNOSIS — C229 Malignant neoplasm of liver, not specified as primary or secondary: Secondary | ICD-10-CM

## 2020-03-14 DIAGNOSIS — Z01818 Encounter for other preprocedural examination: Secondary | ICD-10-CM

## 2020-03-14 DIAGNOSIS — K769 Liver disease, unspecified: Secondary | ICD-10-CM

## 2020-03-14 DIAGNOSIS — Z1159 Encounter for screening for other viral diseases: Secondary | ICD-10-CM | POA: Diagnosis not present

## 2020-03-14 DIAGNOSIS — K7460 Unspecified cirrhosis of liver: Secondary | ICD-10-CM

## 2020-03-14 LAB — SARS CORONAVIRUS 2 (TAT 6-24 HRS): SARS Coronavirus 2: NEGATIVE

## 2020-03-14 NOTE — Progress Notes (Signed)
No egg or soy allergy known to patient  No issues with past sedation with any surgeries or procedures No intubation problems in the past  No FH of Malignant Hyperthermia No diet pills per patient No home 02 use per patient  No blood thinners per patient  Pt denies issues with constipation  No A fib or A flutter  EMMI video to pt or via [place] 19 guidelines implemented in [place] today with Pt and RN   Pt is not fully vaccinated  for Covid, having his second dose on 03/18/20.  Pt sched today for covid test at 10 AM, call to [facility]. And lm with pt's info.   Due to the COVID-19 pandemic we are asking patients to follow certain guidelines.  Pt aware of COVID protocols and [facility] guidelines

## 2020-03-17 ENCOUNTER — Encounter: Payer: Self-pay | Admitting: Internal Medicine

## 2020-03-17 ENCOUNTER — Ambulatory Visit (AMBULATORY_SURGERY_CENTER): Payer: Medicare Other | Admitting: Internal Medicine

## 2020-03-17 ENCOUNTER — Other Ambulatory Visit: Payer: Self-pay

## 2020-03-17 VITALS — BP 96/63 | HR 71 | Temp 98.9°F | Resp 16 | Ht 72.0 in | Wt 252.0 lb

## 2020-03-17 DIAGNOSIS — K7460 Unspecified cirrhosis of liver: Secondary | ICD-10-CM

## 2020-03-17 DIAGNOSIS — E119 Type 2 diabetes mellitus without complications: Secondary | ICD-10-CM | POA: Diagnosis not present

## 2020-03-17 DIAGNOSIS — K900 Celiac disease: Secondary | ICD-10-CM

## 2020-03-17 DIAGNOSIS — I10 Essential (primary) hypertension: Secondary | ICD-10-CM | POA: Diagnosis not present

## 2020-03-17 MED ORDER — SODIUM CHLORIDE 0.9 % IV SOLN: 500.0000 mL | Freq: Once | INTRAVENOUS | Status: DC

## 2020-03-17 NOTE — Progress Notes (Signed)
Called to room to assist during endoscopic procedure.  Patient ID and intended procedure confirmed with present staff. Received instructions for my participation in the procedure from the performing physician.  

## 2020-03-17 NOTE — Patient Instructions (Signed)
Resume previous diet and medications, await pathology results. Repeat upper endoscopy in 2 years for screening purposes.  YOU HAD AN ENDOSCOPIC PROCEDURE TODAY AT [facility]:   Refer to the procedure report that was given to you for any specific questions about what was found during the examination.  If the procedure report does not answer your questions, please call your gastroenterologist to clarify.  If you requested that your care partner not be given the details of your procedure findings, then the procedure report has been included in a sealed envelope for you to review at your convenience later.  YOU SHOULD EXPECT: Some feelings of bloating in the abdomen. Passage of more gas than usual.  Walking can help get rid of the air that was put into your GI tract during the procedure and reduce the bloating. If you had a lower endoscopy (such as a colonoscopy or flexible sigmoidoscopy) you may notice spotting of blood in your stool or on the toilet paper. If you underwent a bowel prep for your procedure, you may not have a normal bowel movement for a few days.  Please Note:  You might notice some irritation and congestion in your nose or some drainage.  This is from the oxygen used during your procedure.  There is no need for concern and it should clear up in a day or so.  SYMPTOMS TO REPORT IMMEDIATELY:   Following upper endoscopy (EGD)  Vomiting of blood or coffee ground material  New chest pain or pain under the shoulder blades  Painful or persistently difficult swallowing  New shortness of breath  Fever of 100F or higher  Black, tarry-looking stools  For urgent or emergent issues, a gastroenterologist can be reached at any hour by calling [phone]. Do not use MyChart messaging for urgent concerns.    DIET:  We do recommend a small meal at first, but then you may proceed to your regular diet.  Drink plenty of fluids but you should avoid alcoholic beverages for 24  hours.  ACTIVITY:  You should plan to take it easy for the rest of today and you should NOT DRIVE or use heavy machinery until tomorrow (because of the sedation medicines used during the test).    FOLLOW UP: Our staff will call the number listed on your records 48-72 hours following your procedure to check on you and address any questions or concerns that you may have regarding the information given to you following your procedure. If we do not reach you, we will leave a message.  We will attempt to reach you two times.  During this call, we will ask if you have developed any symptoms of COVID 19. If you develop any symptoms (ie: fever, flu-like symptoms, shortness of breath, cough etc.) before then, please call [phone].  If you test positive for Covid 19 in the 2 weeks post procedure, please call and report this information to [place].    If any biopsies were taken you will be contacted by phone or by letter within the next 1-3 weeks.  Please call us at [phone] if you have not heard about the biopsies in 3 weeks.    SIGNATURES/CONFIDENTIALITY: You and/or your care partner have signed paperwork which will be entered into your electronic medical record.  These signatures attest to the fact that that the information above on your After Visit Summary has been reviewed and is understood.  Full responsibility of the confidentiality of this discharge information lies with  you and/or your care-partner.

## 2020-03-17 NOTE — Op Note (Signed)
[facility] Patient Name: Joel Reed Procedure Date: 03/17/2020 9:43 AM MRN: [id number] Endoscopist: Jerene Bears , MD Age: 70 Referring MD:  Date of Birth: [birth year] Gender: Male Account #: [id number] Procedure:                Upper GI endoscopy Indications:              History of cirrhosis to rule out esophageal                            varices, last EGD Nov 2019; personal history of                            celiac disease Medicines:                Monitored Anesthesia Care Procedure:                Pre-Anesthesia Assessment:                           - Prior to the procedure, a History and Physical                            was performed, and patient medications and                            allergies were reviewed. The patient's tolerance of                            previous anesthesia was also reviewed. The risks                            and benefits of the procedure and the sedation                            options and risks were discussed with the patient.                            All questions were answered, and informed consent                            was obtained. Prior Anticoagulants: The patient has                            taken no previous anticoagulant or antiplatelet                            agents. ASA Grade Assessment: III - A patient with                            severe systemic disease. After reviewing the risks                            and benefits, the patient was deemed in  satisfactory condition to undergo the procedure.                           After obtaining informed consent, the endoscope was                            passed under direct vision. Throughout the                            procedure, the patient's blood pressure, pulse, and                            oxygen saturations were monitored continuously. The                            Endoscope was introduced through the mouth, and                             advanced to the second part of duodenum. The upper                            GI endoscopy was accomplished without difficulty.                            The patient tolerated the procedure well. Scope In: Scope Out: Findings:                 The examined esophagus was normal.                           There is no endoscopic evidence of varices in the                            entire esophagus.                           Moderate portal hypertensive gastropathy was found                            in the gastric fundus and in the gastric body.                           The exam of the stomach was otherwise normal.                           Diffuse granular mucosa was found in the duodenal                            bulb. This is consistent with previously seen and                            biopsied peptic changes (improved from 2 years ago).                           The second  portion of the duodenum was normal.                            Biopsies for histology were taken with a cold                            forceps for evaluation of celiac disease. Complications:            No immediate complications. Estimated Blood Loss:     Estimated blood loss was minimal. Impression:               - Normal esophagus.                           - No evidence of esophageal or gastric varices.                           - Portal hypertensive gastropathy.                           - Granular mucosa in the duodenal bulb (peptic                            changes) and improved from previous exam.                           - Normal second portion of the duodenum. Biopsied                            in setting of celiac disease. Recommendation:           - Patient has a contact number available for                            emergencies. The signs and symptoms of potential                            delayed complications were discussed with the                            patient.  Return to normal activities tomorrow.                            Written discharge instructions were provided to the                            patient.                           - Resume previous diet.                           - Continue present medications.                           - Await pathology results.                           -  Repeat upper endoscopy in 2 years for screening                            purposes. Jerene Bears, MD 03/17/2020 10:09:42 AM This report has been signed electronically. CC Letter to:             Rayvon Char, MD [facility])

## 2020-03-17 NOTE — Progress Notes (Signed)
Report to PACU, RN, vss, BBS= Clear.  

## 2020-03-18 DIAGNOSIS — Z23 Encounter for immunization: Secondary | ICD-10-CM | POA: Diagnosis not present

## 2020-03-21 ENCOUNTER — Telehealth: Payer: Self-pay | Admitting: *Deleted

## 2020-03-21 NOTE — Telephone Encounter (Signed)
Attempted f/u phone call. No answer. Left message. °

## 2020-03-21 NOTE — Telephone Encounter (Signed)
1. Have you developed a fever since your procedure? no  2.   Have you had an respiratory symptoms (SOB or cough) since your procedure? no  3.   Have you tested positive for COVID 19 since your procedure no  4.   Have you had any family members/close contacts diagnosed with the COVID 19 since your procedure?  no   If yes to any of these questions please route to Joylene John, RN and Joella Prince, RN   Follow up Call-  Call back number 03/17/2020 12/16/2017  Post procedure Call Back phone  # [phone]  Permission to leave phone message Yes Yes  Some recent data might be hidden     Patient questions:  Do you have a fever, pain , or abdominal swelling? No. Pain Score  0 *  Have you tolerated food without any problems? Yes.    Have you been able to return to your normal activities? Yes.    Do you have any questions about your discharge instructions: Diet   No. Medications  No. Follow up visit  No.  Do you have questions or concerns about your Care? No.  Actions: * If pain score is 4 or above: No action needed, pain <4.

## 2020-03-25 ENCOUNTER — Encounter: Payer: Self-pay | Admitting: Internal Medicine

## 2020-04-06 ENCOUNTER — Ambulatory Visit: Payer: BLUE CROSS/BLUE SHIELD | Admitting: Internal Medicine

## 2020-04-26 DIAGNOSIS — K7460 Unspecified cirrhosis of liver: Secondary | ICD-10-CM | POA: Diagnosis not present

## 2020-04-26 DIAGNOSIS — K766 Portal hypertension: Secondary | ICD-10-CM | POA: Diagnosis not present

## 2020-04-26 DIAGNOSIS — K7689 Other specified diseases of liver: Secondary | ICD-10-CM | POA: Diagnosis not present

## 2020-04-26 DIAGNOSIS — K7469 Other cirrhosis of liver: Secondary | ICD-10-CM | POA: Diagnosis not present

## 2020-05-02 DIAGNOSIS — Z1283 Encounter for screening for malignant neoplasm of skin: Secondary | ICD-10-CM | POA: Diagnosis not present

## 2020-05-02 DIAGNOSIS — L304 Erythema intertrigo: Secondary | ICD-10-CM | POA: Diagnosis not present

## 2020-05-02 DIAGNOSIS — I872 Venous insufficiency (chronic) (peripheral): Secondary | ICD-10-CM | POA: Diagnosis not present

## 2020-05-24 ENCOUNTER — Encounter: Payer: Self-pay | Admitting: Family Medicine

## 2020-05-24 ENCOUNTER — Ambulatory Visit (INDEPENDENT_AMBULATORY_CARE_PROVIDER_SITE_OTHER): Payer: Medicare Other | Admitting: Family Medicine

## 2020-05-24 ENCOUNTER — Other Ambulatory Visit: Payer: Self-pay

## 2020-05-24 VITALS — BP 138/74 | HR 82 | Temp 98.7°F | Resp 14 | Ht 72.0 in | Wt 257.0 lb

## 2020-05-24 DIAGNOSIS — K760 Fatty (change of) liver, not elsewhere classified: Secondary | ICD-10-CM | POA: Diagnosis not present

## 2020-05-24 DIAGNOSIS — Z13220 Encounter for screening for lipoid disorders: Secondary | ICD-10-CM | POA: Diagnosis not present

## 2020-05-24 DIAGNOSIS — Z0001 Encounter for general adult medical examination with abnormal findings: Secondary | ICD-10-CM

## 2020-05-24 DIAGNOSIS — Z125 Encounter for screening for malignant neoplasm of prostate: Secondary | ICD-10-CM

## 2020-05-24 DIAGNOSIS — K900 Celiac disease: Secondary | ICD-10-CM | POA: Diagnosis not present

## 2020-05-24 DIAGNOSIS — Z0000 Encounter for general adult medical examination without abnormal findings: Secondary | ICD-10-CM

## 2020-05-24 DIAGNOSIS — Z136 Encounter for screening for cardiovascular disorders: Secondary | ICD-10-CM | POA: Diagnosis not present

## 2020-05-24 DIAGNOSIS — E118 Type 2 diabetes mellitus with unspecified complications: Secondary | ICD-10-CM | POA: Diagnosis not present

## 2020-05-24 NOTE — Progress Notes (Signed)
Subjective:    Patient ID: Joel Gums., male    DOB: 1950/07/02, 70 y.o.   MRN: [id number]  HPI Patient is a 70 year old Caucasian male who is here today for complete physical exam.  He has not been seen since 2020.  He is overdue for lab work.  He recently saw his gastroenterologist who performed a CMP and a CBC.  The CMP was significant for elevated liver function test consistent with his known history of nonalcoholic steatohepatitis.  However he is overdue to check his blood sugar as well as his cholesterol.  He is also overdue for prostate cancer screening.  He denies any falls or memory loss or depression.  His last colonoscopy was in 2019 and is due again in 2024.  His immunizations are listed below.  He has had 2 COVID vaccines.  He is due for his COVID booster in July.  He is due for the Shingrix vaccine  Past Medical History:  Diagnosis Date  . Allergy    seasonal  . Celiac disease   . Cirrhosis ([place])   . Diabetes mellitus without complication ([place])   . Gastritis and duodenitis    on egd 12/2017  . Hyperlipidemia    Patient denies  . Hypertension   . IDA (iron deficiency anemia)   . Internal hemorrhoids   . Nephrolithiasis   . 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  . Right buttock pain 07/29/2018  . Sleep apnea    no cpap  . Splenomegaly   . Tubular adenoma of colon    Past Surgical History:  Procedure Laterality Date  . COLONOSCOPY    . JOINT REPLACEMENT     left hip Dr Noemi Chapel  . POLYPECTOMY    . SPINE SURGERY     disectomy  . UPPER GASTROINTESTINAL ENDOSCOPY  12/16/2017   Current Outpatient Medications on File Prior to Visit  Medication Sig Dispense Refill  . amLODipine (NORVASC) 5 MG tablet Take 1 tablet by mouth once daily 90 tablet 3  . cholecalciferol (VITAMIN D3) 25 MCG (1000 UT) tablet Take 1,000 Units by mouth daily.    . CONTOUR NEXT TEST test strip USE 1 STRIP TO CHECK GLUCOSE ONCE DAILY 100 each 0  .  fluticasone (FLONASE) 50 MCG/ACT nasal spray Place 2 sprays into both nostrils daily. 16 g 2  . glimepiride (AMARYL) 4 MG tablet Take 1 tablet by mouth once daily 90 tablet 0  . ketoconazole (NIZORAL) 2 % cream Apply topically 2 (two) times daily.    Marland Kitchen losartan (COZAAR) 100 MG tablet Take 1 tablet by mouth once daily 90 tablet 0  . metFORMIN (GLUCOPHAGE) 1000 MG tablet TAKE 1 TABLET BY MOUTH TWICE DAILY WITH A MEAL 180 tablet 0  . metoprolol succinate (TOPROL-XL) 25 MG 24 hr tablet Take 1 tablet by mouth once daily 90 tablet 3  . vitamin B-12 (CYANOCOBALAMIN) 500 MCG tablet Take 500 mcg by mouth daily.     No current facility-administered medications on file prior to visit.   Allergies  Allergen Reactions  . Gluten Meal    Social History   Socioeconomic History  . Marital status: Married    Spouse name: Not on file  . Number of children: Not on file  . Years of education: Not on file  . Highest education level: Not on file  Occupational History  . Not on file  Tobacco Use  . Smoking status: Never Smoker  .  Smokeless tobacco: Never Used  Vaping Use  . Vaping Use: Never used  Substance and Sexual Activity  . Alcohol use: No    Comment: quit 1990  . Drug use: No  . Sexual activity: Yes  Other Topics Concern  . Not on file  Social History Narrative  . Not on file   Social Determinants of Health   Financial Resource Strain: Not on file  Food Insecurity: Not on file  Transportation Needs: Not on file  Physical Activity: Not on file  Stress: Not on file  Social Connections: Not on file  Intimate Partner Violence: Not on file   Family History  Problem Relation Age of Onset  . Esophageal cancer Father   . Breast cancer Sister        bone cancer  . Heart attack Brother 71  . Cancer - Other Sister        cervical?  . Colon polyps Mother   . Colon cancer Neg Hx   . Rectal cancer Neg Hx   . Stomach cancer Neg Hx    Father died from throat cancer  Review of  Systems     Objective:   Physical Exam Vitals reviewed.  Constitutional:      General: He is not in acute distress.    Appearance: He is well-developed. He is not diaphoretic.  HENT:     Head: Normocephalic and atraumatic.     Right Ear: External ear normal.     Left Ear: External ear normal.     Nose: Nose normal.     Mouth/Throat:     Pharynx: No oropharyngeal exudate.  Eyes:     General: No scleral icterus.       Right eye: No discharge.        Left eye: No discharge.     Conjunctiva/sclera: Conjunctivae normal.     Pupils: Pupils are equal, round, and reactive to light.  Neck:     Thyroid: No thyromegaly.     Vascular: No JVD.  Cardiovascular:     Rate and Rhythm: Normal rate and regular rhythm.     Heart sounds: Normal heart sounds. No murmur heard. No friction rub. No gallop.   Pulmonary:     Effort: Pulmonary effort is normal. No respiratory distress.     Breath sounds: Normal breath sounds. No wheezing or rales.  Chest:     Chest wall: No tenderness.  Abdominal:     General: Bowel sounds are normal. There is no distension.     Palpations: Abdomen is soft. There is no mass.     Tenderness: There is no abdominal tenderness. There is no guarding or rebound.  Musculoskeletal:        General: No tenderness or deformity. Normal range of motion.     Cervical back: Neck supple.  Lymphadenopathy:     Cervical: No cervical adenopathy.  Skin:    General: Skin is warm and dry.     Coloration: Skin is not pale.     Findings: No erythema or rash.  Neurological:     Mental Status: He is alert and oriented to person, place, and time.     Cranial Nerves: No cranial nerve deficit.     Motor: No abnormal muscle tone.     Coordination: Coordination normal.     Deep Tendon Reflexes: Reflexes are normal and symmetric.  Psychiatric:        Behavior: Behavior normal.        Thought  Content: Thought content normal.        Judgment: Judgment normal.           Assessment  & Plan:  Screening cholesterol level - Plan: Lipid panel  Prostate cancer screening - Plan: PSA  NAFLD (nonalcoholic fatty liver disease)  Celiac disease  Controlled type 2 diabetes mellitus with complication, without long-term current use of insulin ([place]) - Plan: Hemoglobin A1c  General medical exam  I will check a PSA to screen for prostate cancer.  His colon cancer screening is up-to-date until 2024.  I will monitor the management of his diabetes by checking an A1c.  Ideally I like his A1c to be below 6.5.  I will check a fasting lipid panel to screen his cholesterol.  Ideally I like his LDL cholesterol to be well below 100.  I will defer management of his nonalcoholic fatty liver disease to his gastroenterologist however I encouraged the patient to try to get 30 minutes a day 5 days a week of aerobic exercise to help treat both his diabetes as well as his fatty liver disease.  I recommended the shingles vaccine.  Also recommended a booster on his COVID shot.  Otherwise his cancer screening and immunizations are up-to-date.  Again he denies any falls, depression, or memory loss

## 2020-05-25 LAB — PSA: PSA: 0.11 ng/mL (ref <=4.0)

## 2020-05-25 LAB — LIPID PANEL
Cholesterol: 168 mg/dL (ref <200)
HDL: 53 mg/dL (ref >=40)
LDL Cholesterol (Calc): 97 mg/dL (calc)
Non-HDL Cholesterol (Calc): 115 mg/dL (calc) (ref <130)
Total CHOL/HDL Ratio: 3.2 (calc) (ref <5.0)
Triglycerides: 88 mg/dL (ref <150)

## 2020-05-26 ENCOUNTER — Encounter: Payer: Self-pay | Admitting: Family Medicine

## 2020-06-08 DIAGNOSIS — E559 Vitamin D deficiency, unspecified: Secondary | ICD-10-CM | POA: Diagnosis not present

## 2020-06-08 DIAGNOSIS — M810 Age-related osteoporosis without current pathological fracture: Secondary | ICD-10-CM | POA: Diagnosis not present

## 2020-06-08 DIAGNOSIS — K7581 Nonalcoholic steatohepatitis (NASH): Secondary | ICD-10-CM | POA: Diagnosis not present

## 2020-06-08 DIAGNOSIS — K7689 Other specified diseases of liver: Secondary | ICD-10-CM | POA: Diagnosis not present

## 2020-06-08 DIAGNOSIS — Z6835 Body mass index (BMI) 35.0-35.9, adult: Secondary | ICD-10-CM | POA: Diagnosis not present

## 2020-06-08 DIAGNOSIS — K7460 Unspecified cirrhosis of liver: Secondary | ICD-10-CM | POA: Diagnosis not present

## 2020-06-08 DIAGNOSIS — Z23 Encounter for immunization: Secondary | ICD-10-CM | POA: Diagnosis not present

## 2020-06-08 DIAGNOSIS — K900 Celiac disease: Secondary | ICD-10-CM | POA: Diagnosis not present

## 2020-06-13 DIAGNOSIS — E113213 Type 2 diabetes mellitus with mild nonproliferative diabetic retinopathy with macular edema, bilateral: Secondary | ICD-10-CM | POA: Diagnosis not present

## 2020-06-27 ENCOUNTER — Other Ambulatory Visit: Payer: Self-pay | Admitting: Family Medicine

## 2020-07-25 ENCOUNTER — Ambulatory Visit: Payer: BLUE CROSS/BLUE SHIELD | Admitting: Oncology

## 2020-09-12 ENCOUNTER — Other Ambulatory Visit: Payer: Self-pay | Admitting: Family Medicine

## 2020-09-26 ENCOUNTER — Other Ambulatory Visit: Payer: Self-pay | Admitting: Family Medicine

## 2020-10-02 ENCOUNTER — Other Ambulatory Visit: Payer: Self-pay | Admitting: Family Medicine

## 2020-10-03 DIAGNOSIS — M62830 Muscle spasm of back: Secondary | ICD-10-CM | POA: Diagnosis not present

## 2020-10-03 DIAGNOSIS — M9905 Segmental and somatic dysfunction of pelvic region: Secondary | ICD-10-CM | POA: Diagnosis not present

## 2020-10-03 DIAGNOSIS — M9902 Segmental and somatic dysfunction of thoracic region: Secondary | ICD-10-CM | POA: Diagnosis not present

## 2020-10-03 DIAGNOSIS — M9903 Segmental and somatic dysfunction of lumbar region: Secondary | ICD-10-CM | POA: Diagnosis not present

## 2020-10-05 DIAGNOSIS — M62830 Muscle spasm of back: Secondary | ICD-10-CM | POA: Diagnosis not present

## 2020-10-05 DIAGNOSIS — M9903 Segmental and somatic dysfunction of lumbar region: Secondary | ICD-10-CM | POA: Diagnosis not present

## 2020-10-05 DIAGNOSIS — M9905 Segmental and somatic dysfunction of pelvic region: Secondary | ICD-10-CM | POA: Diagnosis not present

## 2020-10-05 DIAGNOSIS — M9902 Segmental and somatic dysfunction of thoracic region: Secondary | ICD-10-CM | POA: Diagnosis not present

## 2020-10-24 DIAGNOSIS — K7469 Other cirrhosis of liver: Secondary | ICD-10-CM | POA: Diagnosis not present

## 2020-10-24 DIAGNOSIS — K7460 Unspecified cirrhosis of liver: Secondary | ICD-10-CM | POA: Diagnosis not present

## 2020-10-24 DIAGNOSIS — E559 Vitamin D deficiency, unspecified: Secondary | ICD-10-CM | POA: Diagnosis not present

## 2020-10-24 DIAGNOSIS — K7689 Other specified diseases of liver: Secondary | ICD-10-CM | POA: Diagnosis not present

## 2020-10-24 DIAGNOSIS — K7581 Nonalcoholic steatohepatitis (NASH): Secondary | ICD-10-CM | POA: Diagnosis not present

## 2020-10-24 DIAGNOSIS — M810 Age-related osteoporosis without current pathological fracture: Secondary | ICD-10-CM | POA: Diagnosis not present

## 2020-10-24 DIAGNOSIS — K766 Portal hypertension: Secondary | ICD-10-CM | POA: Diagnosis not present

## 2020-10-24 DIAGNOSIS — K900 Celiac disease: Secondary | ICD-10-CM | POA: Diagnosis not present

## 2020-11-23 DIAGNOSIS — E559 Vitamin D deficiency, unspecified: Secondary | ICD-10-CM | POA: Diagnosis not present

## 2020-11-23 DIAGNOSIS — K7689 Other specified diseases of liver: Secondary | ICD-10-CM | POA: Diagnosis not present

## 2020-11-23 DIAGNOSIS — Z6834 Body mass index (BMI) 34.0-34.9, adult: Secondary | ICD-10-CM | POA: Diagnosis not present

## 2020-11-23 DIAGNOSIS — K7460 Unspecified cirrhosis of liver: Secondary | ICD-10-CM | POA: Diagnosis not present

## 2020-11-23 DIAGNOSIS — K7581 Nonalcoholic steatohepatitis (NASH): Secondary | ICD-10-CM | POA: Diagnosis not present

## 2020-12-23 ENCOUNTER — Other Ambulatory Visit: Payer: Self-pay | Admitting: Family Medicine

## 2020-12-23 ENCOUNTER — Other Ambulatory Visit: Payer: Self-pay

## 2021-02-01 DIAGNOSIS — L308 Other specified dermatitis: Secondary | ICD-10-CM | POA: Diagnosis not present

## 2021-02-06 ENCOUNTER — Other Ambulatory Visit: Payer: Self-pay | Admitting: Family Medicine

## 2021-02-07 ENCOUNTER — Other Ambulatory Visit: Payer: Self-pay | Admitting: Family Medicine

## 2021-02-08 ENCOUNTER — Other Ambulatory Visit: Payer: Self-pay

## 2021-02-08 DIAGNOSIS — E118 Type 2 diabetes mellitus with unspecified complications: Secondary | ICD-10-CM

## 2021-02-08 MED ORDER — CONTOUR NEXT TEST VI STRP: ORAL_STRIP | 0 refills | Status: DC

## 2021-03-09 DIAGNOSIS — U071 COVID-19: Secondary | ICD-10-CM | POA: Diagnosis not present

## 2021-03-13 ENCOUNTER — Other Ambulatory Visit: Payer: Self-pay | Admitting: Family Medicine

## 2021-03-27 ENCOUNTER — Other Ambulatory Visit: Payer: Self-pay | Admitting: Family Medicine

## 2021-03-27 DIAGNOSIS — E118 Type 2 diabetes mellitus with unspecified complications: Secondary | ICD-10-CM

## 2021-05-04 DIAGNOSIS — D225 Melanocytic nevi of trunk: Secondary | ICD-10-CM | POA: Diagnosis not present

## 2021-05-04 DIAGNOSIS — D485 Neoplasm of uncertain behavior of skin: Secondary | ICD-10-CM | POA: Diagnosis not present

## 2021-05-04 DIAGNOSIS — Z1283 Encounter for screening for malignant neoplasm of skin: Secondary | ICD-10-CM | POA: Diagnosis not present

## 2021-05-04 DIAGNOSIS — D034 Melanoma in situ of scalp and neck: Secondary | ICD-10-CM | POA: Diagnosis not present

## 2021-05-07 ENCOUNTER — Other Ambulatory Visit: Payer: Self-pay | Admitting: Family Medicine

## 2021-05-15 DIAGNOSIS — D034 Melanoma in situ of scalp and neck: Secondary | ICD-10-CM | POA: Diagnosis not present

## 2021-05-15 DIAGNOSIS — L988 Other specified disorders of the skin and subcutaneous tissue: Secondary | ICD-10-CM | POA: Diagnosis not present

## 2021-05-18 ENCOUNTER — Telehealth: Payer: Self-pay | Admitting: Family Medicine

## 2021-05-18 DIAGNOSIS — E118 Type 2 diabetes mellitus with unspecified complications: Secondary | ICD-10-CM

## 2021-05-18 MED ORDER — CONTOUR NEXT TEST VI STRP: ORAL_STRIP | 0 refills | Status: DC

## 2021-05-18 NOTE — Telephone Encounter (Signed)
Rx sent to pharmacy. Pt needs OV for further refills.  ?

## 2021-05-18 NOTE — Telephone Encounter (Signed)
Received eFax from pharmacy to request clarity of refill request for  ? ?glucose blood (CONTOUR NEXT TEST) test strip [[id number]]  ? ?Efax received from ? ?[place], [place] [address]  ?[address] [place], [place] [address]  ?Phone:  [phone]  Fax:  [phone]  ? ?Please advise pharmacist.  ?

## 2021-06-14 DIAGNOSIS — E119 Type 2 diabetes mellitus without complications: Secondary | ICD-10-CM | POA: Diagnosis not present

## 2021-06-14 LAB — HM DIABETES EYE EXAM

## 2021-06-22 ENCOUNTER — Other Ambulatory Visit: Payer: Self-pay | Admitting: Family Medicine

## 2021-06-22 NOTE — Telephone Encounter (Signed)
Courtesy refill provided #30.  Secured appt for 07/04/21 ? ?Requested Prescriptions  ?Pending Prescriptions Disp Refills  ?? amLODipine (NORVASC) 5 MG tablet [Pharmacy Med Name: amLODIPine Besylate 5 MG Oral Tablet] 30 tablet 0  ?  Sig: Take 1 tablet by mouth once daily  ?  ? Cardiovascular: Calcium Channel Blockers 2 Failed - 06/22/2021  1:18 PM  ?  ?  Failed - Valid encounter within last 6 months  ?  Recent Outpatient Visits   ?      ? 1 year ago Screening cholesterol level  ? [facility] Pickard, Cammie Mcgee, MD  ? 2 years ago Acute maxillary sinusitis, recurrence not specified  ? [place], Modena Nunnery, MD  ? 2 years ago Controlled type 2 diabetes mellitus with complication, without long-term current use of insulin ([place])  ? [facility] Pickard, Cammie Mcgee, MD  ? 2 years ago Hospital discharge follow-up  ? [facility] Pickard, Cammie Mcgee, MD  ? 3 years ago Acute pansinusitis, recurrence not specified  ? [place] Delsa Grana, PA-C  ?  ?  ?Future Appointments   ?        ? In 1 week Pickard, Cammie Mcgee, MD [place], [facility]  ?  ? ?  ?  ?  Passed - Last BP in normal range  ?  BP Readings from Last 1 Encounters:  ?05/24/20 138/74  ?   ?  ?  Passed - Last Heart Rate in normal range  ?  Pulse Readings from Last 1 Encounters:  ?05/24/20 82  ?   ?  ?  ? ? ?

## 2021-07-04 ENCOUNTER — Ambulatory Visit: Payer: Medicare Other | Admitting: Family Medicine

## 2021-07-06 ENCOUNTER — Other Ambulatory Visit: Payer: Self-pay | Admitting: Family Medicine

## 2021-07-06 DIAGNOSIS — E118 Type 2 diabetes mellitus with unspecified complications: Secondary | ICD-10-CM

## 2021-07-06 NOTE — Telephone Encounter (Signed)
Received eFax from pharmacy to request refill of   glucose blood (CONTOUR NEXT TEST) test strip   Fax received from  [place], [place] - [address] [place], [place] [address]  Phone:  [phone]  Fax:  [phone]   Please advise pharmacist.

## 2021-07-10 MED ORDER — CONTOUR NEXT TEST VI STRP: ORAL_STRIP | 0 refills | Status: DC

## 2021-07-10 NOTE — Telephone Encounter (Signed)
Appointment 07/11/21- courtesy RF Requested Prescriptions  Pending Prescriptions Disp Refills  . glucose blood (CONTOUR NEXT TEST) test strip 100 each 0    Sig: USE 1 STRIP TO CHECK GLUCOSE ONCE DAILY. NEEDS OV FOR FURTHER REFILLS.     Endocrinology: Diabetes - Testing Supplies Failed - 07/06/2021  1:00 PM      Failed - Valid encounter within last 12 months    Recent Outpatient Visits          1 year ago Screening cholesterol level   [place] Susy Frizzle, MD   2 years ago Acute maxillary sinusitis, recurrence not specified   [place], Modena Nunnery, MD   2 years ago Controlled type 2 diabetes mellitus with complication, without long-term current use of insulin ([place])   [facility] Pickard, Cammie Mcgee, MD   3 years ago Hospital discharge follow-up   [place], Warren T, MD   3 years ago Acute pansinusitis, recurrence not specified   [place] Delsa Grana, PA-C      Future Appointments            Tomorrow Pickard, Cammie Mcgee, MD [place], [place]

## 2021-07-11 ENCOUNTER — Ambulatory Visit (INDEPENDENT_AMBULATORY_CARE_PROVIDER_SITE_OTHER): Payer: Medicare Other | Admitting: Family Medicine

## 2021-07-11 ENCOUNTER — Other Ambulatory Visit: Payer: Self-pay | Admitting: Family Medicine

## 2021-07-11 ENCOUNTER — Other Ambulatory Visit: Payer: Self-pay

## 2021-07-11 VITALS — BP 136/68 | HR 76 | Temp 98.2°F | Ht 73.0 in | Wt 259.4 lb

## 2021-07-11 DIAGNOSIS — N401 Enlarged prostate with lower urinary tract symptoms: Secondary | ICD-10-CM | POA: Diagnosis not present

## 2021-07-11 DIAGNOSIS — K760 Fatty (change of) liver, not elsewhere classified: Secondary | ICD-10-CM

## 2021-07-11 DIAGNOSIS — Z125 Encounter for screening for malignant neoplasm of prostate: Secondary | ICD-10-CM

## 2021-07-11 DIAGNOSIS — R350 Frequency of micturition: Secondary | ICD-10-CM

## 2021-07-11 DIAGNOSIS — E118 Type 2 diabetes mellitus with unspecified complications: Secondary | ICD-10-CM

## 2021-07-11 NOTE — Telephone Encounter (Signed)
Pt called in requesting a refill of these meds glimepiride (AMARYL) 4 MG tablet and amLODipine (NORVASC) 5 MG tablet sent to the [facility] in [place]. Please advise.  Cb#: [phone]

## 2021-07-11 NOTE — Progress Notes (Signed)
Subjective:    Patient ID: Joel Gums., male    DOB: September 27, 1950, 71 y.o.   MRN: [id number]  HPI Patient is a very pleasant 71 year old Caucasian gentleman who is here today for follow-up of his chronic medical conditions.  Joel Reed has a history of type 2 diabetes mellitus which she is currently taking glimepiride and metformin.  Joel Reed also has a history of nonalcoholic fatty liver disease with cirrhosis.  His last cholesterol in April 2022 was outstanding.  For that reason and the fact that Joel Reed has cirrhosis I been hesitant to put him on statin.  However his blood sugars recently have been more elevated.  Joel Reed reports blood sugars near 200.  His blood pressure today is well controlled.  Joel Reed denies any chest pain shortness of breath or dyspnea on exertion.  His immunizations including his shingles vaccine are up-to-date.  Past Medical History:  Diagnosis Date   Allergy    seasonal   Celiac disease    Cirrhosis ([place])    Diabetes mellitus without complication ([place])    Gastritis and duodenitis    on egd 12/2017   Hyperlipidemia    Patient denies   Hypertension    IDA (iron deficiency anemia)    Internal hemorrhoids    Nephrolithiasis    PVD (peripheral vascular disease) ([place])    decreased TBI in right foot.   Right buttock pain 07/29/2018   Sleep apnea    no cpap   Splenomegaly    Tubular adenoma of colon    Past Surgical History:  Procedure Laterality Date   COLONOSCOPY     JOINT REPLACEMENT     left hip Dr Noemi Chapel   POLYPECTOMY     SPINE SURGERY     disectomy   UPPER GASTROINTESTINAL ENDOSCOPY  12/16/2017   Current Outpatient Medications on File Prior to Visit  Medication Sig Dispense Refill   amLODipine (NORVASC) 5 MG tablet Take 1 tablet by mouth once daily 30 tablet 0   fluticasone (FLONASE) 50 MCG/ACT nasal spray Place 2 sprays into both nostrils daily. 16 g 2   glimepiride (AMARYL) 4 MG tablet TAKE 1 TABLET BY MOUTH ONCE DAILY . APPOINTMENT REQUIRED FOR FUTURE REFILLS 30  tablet 0   glucose blood (CONTOUR NEXT TEST) test strip USE 1 STRIP TO CHECK GLUCOSE ONCE DAILY. NEEDS OV FOR FURTHER REFILLS. 100 each 0   ketoconazole (NIZORAL) 2 % cream Apply topically 2 (two) times daily.     losartan (COZAAR) 100 MG tablet Take 1 tablet by mouth once daily 90 tablet 3   metFORMIN (GLUCOPHAGE) 1000 MG tablet TAKE 1 TABLET BY MOUTH TWICE DAILY WITH A MEAL 180 tablet 3   metoprolol succinate (TOPROL-XL) 25 MG 24 hr tablet Take 1 tablet by mouth once daily 90 tablet 3   vitamin B-12 (CYANOCOBALAMIN) 500 MCG tablet Take 500 mcg by mouth daily.     No current facility-administered medications on file prior to visit.   Allergies  Allergen Reactions   Gluten Meal    Social History   Socioeconomic History   Marital status: Married    Spouse name: Not on file   Number of children: Not on file   Years of education: Not on file   Highest education level: Not on file  Occupational History   Not on file  Tobacco Use   Smoking status: Never   Smokeless tobacco: Never  Vaping Use   Vaping Use: Never used  Substance and Sexual Activity  Alcohol use: No    Comment: quit 1990   Drug use: No   Sexual activity: Yes  Other Topics Concern   Not on file  Social History Narrative   Not on file   Social Determinants of Health   Financial Resource Strain: Not on file  Food Insecurity: Not on file  Transportation Needs: Not on file  Physical Activity: Not on file  Stress: Not on file  Social Connections: Not on file  Intimate Partner Violence: Not on file   Family History  Problem Relation Age of Onset   Esophageal cancer Father    Breast cancer Sister        bone cancer   Heart attack Brother 49   Cancer - Other Sister        cervical?   Colon polyps Mother    Colon cancer Neg Hx    Rectal cancer Neg Hx    Stomach cancer Neg Hx    Father died from throat cancer  Review of Systems     Objective:   Physical Exam Vitals reviewed.  Constitutional:       General: Joel Reed is not in acute distress.    Appearance: Joel Reed is well-developed. Joel Reed is not diaphoretic.  HENT:     Head: Normocephalic and atraumatic.     Right Ear: External ear normal.     Left Ear: External ear normal.     Nose: Nose normal.     Mouth/Throat:     Pharynx: No oropharyngeal exudate.  Eyes:     General: No scleral icterus.       Right eye: No discharge.        Left eye: No discharge.     Conjunctiva/sclera: Conjunctivae normal.     Pupils: Pupils are equal, round, and reactive to light.  Neck:     Thyroid: No thyromegaly.     Vascular: No JVD.  Cardiovascular:     Rate and Rhythm: Normal rate and regular rhythm.     Heart sounds: Normal heart sounds. No murmur heard.   No friction rub. No gallop.  Pulmonary:     Effort: Pulmonary effort is normal. No respiratory distress.     Breath sounds: Normal breath sounds. No wheezing or rales.  Chest:     Chest wall: No tenderness.  Abdominal:     General: Bowel sounds are normal. There is no distension.     Palpations: Abdomen is soft. There is no mass.     Tenderness: There is no abdominal tenderness. There is no guarding or rebound.  Musculoskeletal:        General: No tenderness or deformity. Normal range of motion.     Cervical back: Neck supple.  Lymphadenopathy:     Cervical: No cervical adenopathy.  Skin:    General: Skin is warm and dry.     Coloration: Skin is not pale.     Findings: No erythema or rash.  Neurological:     Mental Status: Joel Reed is alert and oriented to person, place, and time.     Cranial Nerves: No cranial nerve deficit.     Motor: No abnormal muscle tone.     Coordination: Coordination normal.     Deep Tendon Reflexes: Reflexes are normal and symmetric.  Psychiatric:        Behavior: Behavior normal.        Thought Content: Thought content normal.        Judgment: Judgment normal.  Assessment & Plan:  Controlled type 2 diabetes mellitus with complication, without long-term  current use of insulin ([facility]) - Plan: Hemoglobin A1c, CBC with Differential/Platelet, Lipid panel, Microalbumin, urine, COMPLETE METABOLIC PANEL WITH GFR  NAFLD (nonalcoholic fatty liver disease)  Prostate cancer screening - Plan: PSA  Benign prostatic hyperplasia with urinary frequency - Plan: PSA Diabetic foot exam today is normal except for diminished dorsalis pedis pulse on the left but Joel Reed is asymptomatic so we will monitor this.  I like to see the patient lose weight.  I believe that this would help his diabetes as well as his nonalcoholic fatty liver disease.  Therefore his A1c is elevated I will recommend trying Ozempic.  I discussed this with the patient and Joel Reed is willing to do so.  Check an A1c.  Goal A1c is less than 6.5.  Check a lipid panel.  Goal LDL cholesterol is less than 100.  Check a urine microalbumin.  Goal albumin to creatinine ratio is less than 30.  Screen for prostate cancer with PSA.  Joel Reed does have some urinary frequency which I assume is due to BPH.

## 2021-07-12 LAB — CBC WITH DIFFERENTIAL/PLATELET
Absolute Monocytes: 500 cells/uL (ref 200–950)
Basophils Absolute: 29 cells/uL (ref 0–200)
Basophils Relative: 0.6 %
Eosinophils Absolute: 123 cells/uL (ref 15–500)
Eosinophils Relative: 2.5 %
HCT: 40.9 % (ref 38.5–50.0)
Hemoglobin: 14.3 g/dL (ref 13.2–17.1)
Lymphs Abs: 1215 cells/uL (ref 850–3900)
MCH: 31.3 pg (ref 27.0–33.0)
MCHC: 35 g/dL (ref 32.0–36.0)
MCV: 89.5 fL (ref 80.0–100.0)
MPV: 12.2 fL (ref 7.5–12.5)
Monocytes Relative: 10.2 %
Neutro Abs: 3033 cells/uL (ref 1500–7800)
Neutrophils Relative %: 61.9 %
Platelets: 113 10*3/uL — ABNORMAL LOW (ref 140–400)
RBC: 4.57 10*6/uL (ref 4.20–5.80)
RDW: 12.6 % (ref 11.0–15.0)
Total Lymphocyte: 24.8 %
WBC: 4.9 10*3/uL (ref 3.8–10.8)

## 2021-07-12 LAB — COMPLETE METABOLIC PANEL WITH GFR
AG Ratio: 1.4 (calc) (ref 1.0–2.5)
ALT: 42 U/L (ref 9–46)
AST: 26 U/L (ref 10–35)
Albumin: 4.3 g/dL (ref 3.6–5.1)
Alkaline phosphatase (APISO): 54 U/L (ref 35–144)
BUN: 19 mg/dL (ref 7–25)
CO2: 22 mmol/L (ref 20–32)
Calcium: 9.3 mg/dL (ref 8.6–10.3)
Chloride: 102 mmol/L (ref 98–110)
Creat: 0.85 mg/dL (ref 0.70–1.35)
Globulin: 3 g/dL (calc) (ref 1.9–3.7)
Glucose, Bld: 199 mg/dL — ABNORMAL HIGH (ref 65–99)
Potassium: 4.4 mmol/L (ref 3.5–5.3)
Sodium: 139 mmol/L (ref 135–146)
Total Bilirubin: 0.9 mg/dL (ref 0.2–1.2)
Total Protein: 7.3 g/dL (ref 6.1–8.1)
eGFR: 95 mL/min/{1.73_m2} (ref >=60)

## 2021-07-12 LAB — LIPID PANEL
Cholesterol: 151 mg/dL (ref <200)
HDL: 52 mg/dL (ref >=40)
LDL Cholesterol (Calc): 82 mg/dL (calc)
Non-HDL Cholesterol (Calc): 99 mg/dL (calc) (ref <130)
Total CHOL/HDL Ratio: 2.9 (calc) (ref <5.0)
Triglycerides: 88 mg/dL (ref <150)

## 2021-07-12 LAB — HEMOGLOBIN A1C
Hgb A1c MFr Bld: 7.5 % of total Hgb — ABNORMAL HIGH (ref <5.7)
Mean Plasma Glucose: 169 mg/dL
eAG (mmol/L): 9.3 mmol/L

## 2021-07-12 LAB — MICROALBUMIN, URINE: Microalb, Ur: 15.7 mg/dL

## 2021-07-12 LAB — PSA: PSA: 0.11 ng/mL (ref <=4.00)

## 2021-07-12 MED ORDER — AMLODIPINE BESYLATE 5 MG PO TABS: 5.0000 mg | ORAL_TABLET | Freq: Every day | ORAL | 0 refills | Status: DC

## 2021-07-12 MED ORDER — GLIMEPIRIDE 4 MG PO TABS: ORAL_TABLET | ORAL | 0 refills | Status: DC

## 2021-07-12 NOTE — Telephone Encounter (Signed)
Requested Prescriptions  Pending Prescriptions Disp Refills  . glimepiride (AMARYL) 4 MG tablet 30 tablet 0    Sig: TAKE 1 TABLET BY MOUTH ONCE DAILY . APPOINTMENT REQUIRED FOR FUTURE REFILLS     Endocrinology:  Diabetes - Sulfonylureas Failed - 07/11/2021  2:58 PM      Failed - HBA1C is between 0 and 7.9 and within 180 days    Hgb A1c MFr Bld  Date Value Ref Range Status  07/11/2021 7.5 (H) <5.7 % of total Hgb Final    Comment:    For someone without known diabetes, a hemoglobin A1c value of 6.5% or greater indicates that they may have  diabetes and this should be confirmed with a follow-up  test. . For someone with known diabetes, a value <7% indicates  that their diabetes is well controlled and a value  greater than or equal to 7% indicates suboptimal  control. A1c targets should be individualized based on  duration of diabetes, age, comorbid conditions, and  other considerations. . Currently, no consensus exists regarding use of hemoglobin A1c for diagnosis of diabetes for children. .          Failed - Cr in normal range and within 360 days    Creat  Date Value Ref Range Status  07/11/2021 0.85 0.70 - 1.35 mg/dL Final         Passed - Valid encounter within last 6 months    Recent Outpatient Visits          Yesterday Controlled type 2 diabetes mellitus with complication, without long-term current use of insulin ([place])   [place] Pickard, Cammie Mcgee, MD   1 year ago Screening cholesterol level   [place] Susy Frizzle, MD   2 years ago Acute maxillary sinusitis, recurrence not specified   [place], Modena Nunnery, MD   2 years ago Controlled type 2 diabetes mellitus with complication, without long-term current use of insulin ([place])   [place] Pickard, Cammie Mcgee, MD   3 years ago Hospital discharge follow-up   [place], Warren T, MD             .  amLODipine (NORVASC) 5 MG tablet 30 tablet 0    Sig: Take 1 tablet (5 mg total) by mouth daily.     Cardiovascular: Calcium Channel Blockers 2 Passed - 07/11/2021  2:58 PM      Passed - Last BP in normal range    BP Readings from Last 1 Encounters:  07/11/21 136/68         Passed - Last Heart Rate in normal range    Pulse Readings from Last 1 Encounters:  07/11/21 76         Passed - Valid encounter within last 6 months    Recent Outpatient Visits          Yesterday Controlled type 2 diabetes mellitus with complication, without long-term current use of insulin ([place])   [place] Pickard, Cammie Mcgee, MD   1 year ago Screening cholesterol level   [place] Susy Frizzle, MD   2 years ago Acute maxillary sinusitis, recurrence not specified   [place], Modena Nunnery, MD   2 years ago Controlled type 2 diabetes mellitus with complication, without long-term current use of insulin ([place])   [place] Pickard, Cammie Mcgee, MD   3  years ago Hospital discharge follow-up   [place] Pickard, Cammie Mcgee, MD

## 2021-07-12 NOTE — Telephone Encounter (Signed)
Refused this duplicate refill request of Amaryl 4 mg.   Order already sent and received at [facility].

## 2021-07-14 ENCOUNTER — Telehealth: Payer: Self-pay

## 2021-07-14 NOTE — Telephone Encounter (Signed)
-----   Message from Susy Frizzle, MD sent at 07/13/2021  6:55 AM EDT ----- Diabetes test is too high. I would try adding trulicity 8.00 mg sq weekly to lower sugar and help with weight loss.  Recheck in 3 months.

## 2021-07-14 NOTE — Telephone Encounter (Signed)
I have attempted without success to contact this patient by phone to discuss lab results and I left a message on answering machine.

## 2021-07-26 DIAGNOSIS — K7460 Unspecified cirrhosis of liver: Secondary | ICD-10-CM | POA: Diagnosis not present

## 2021-07-26 DIAGNOSIS — Z6834 Body mass index (BMI) 34.0-34.9, adult: Secondary | ICD-10-CM | POA: Diagnosis not present

## 2021-07-27 ENCOUNTER — Telehealth: Payer: Self-pay | Admitting: Family Medicine

## 2021-07-27 NOTE — Telephone Encounter (Signed)
Left message for patient to call back and schedule Medicare Annual Wellness Visit (AWV) in office.   If not able to come in office, please offer to do virtually or by telephone.  Left office number and my jabber [phone].  Last AWV: 05/24/2020   Please schedule at anytime with Nurse Health Advisor.

## 2021-08-02 ENCOUNTER — Other Ambulatory Visit: Payer: Self-pay | Admitting: Family Medicine

## 2021-08-16 ENCOUNTER — Telehealth: Payer: Self-pay | Admitting: Family Medicine

## 2021-08-16 NOTE — Telephone Encounter (Signed)
Left message for patient to call back and schedule Medicare Annual Wellness Visit (AWV) in office.  ° °If not able to come in office, please offer to do virtually or by telephone.  Left office number and my jabber #[phone]. ° °Due for AWVI ° °Please schedule at anytime with Nurse Health Advisor. °  °

## 2021-08-21 ENCOUNTER — Other Ambulatory Visit: Payer: Self-pay | Admitting: Family Medicine

## 2021-09-07 DIAGNOSIS — L304 Erythema intertrigo: Secondary | ICD-10-CM | POA: Diagnosis not present

## 2021-09-07 DIAGNOSIS — D225 Melanocytic nevi of trunk: Secondary | ICD-10-CM | POA: Diagnosis not present

## 2021-09-07 DIAGNOSIS — Z85820 Personal history of malignant melanoma of skin: Secondary | ICD-10-CM | POA: Diagnosis not present

## 2021-09-07 DIAGNOSIS — Z1283 Encounter for screening for malignant neoplasm of skin: Secondary | ICD-10-CM | POA: Diagnosis not present

## 2021-09-07 DIAGNOSIS — Z08 Encounter for follow-up examination after completed treatment for malignant neoplasm: Secondary | ICD-10-CM | POA: Diagnosis not present

## 2021-09-13 DIAGNOSIS — K7689 Other specified diseases of liver: Secondary | ICD-10-CM | POA: Diagnosis not present

## 2021-09-13 DIAGNOSIS — C220 Liver cell carcinoma: Secondary | ICD-10-CM | POA: Diagnosis not present

## 2021-09-13 DIAGNOSIS — E559 Vitamin D deficiency, unspecified: Secondary | ICD-10-CM | POA: Diagnosis not present

## 2021-09-13 DIAGNOSIS — K766 Portal hypertension: Secondary | ICD-10-CM | POA: Diagnosis not present

## 2021-09-13 DIAGNOSIS — K7581 Nonalcoholic steatohepatitis (NASH): Secondary | ICD-10-CM | POA: Diagnosis not present

## 2021-09-13 DIAGNOSIS — K7460 Unspecified cirrhosis of liver: Secondary | ICD-10-CM | POA: Diagnosis not present

## 2021-09-13 DIAGNOSIS — K7469 Other cirrhosis of liver: Secondary | ICD-10-CM | POA: Diagnosis not present

## 2021-09-25 ENCOUNTER — Other Ambulatory Visit: Payer: Self-pay | Admitting: Family Medicine

## 2021-09-26 ENCOUNTER — Other Ambulatory Visit: Payer: Self-pay | Admitting: Family Medicine

## 2021-09-27 NOTE — Telephone Encounter (Signed)
Requested Prescriptions  Pending Prescriptions Disp Refills  . glimepiride (AMARYL) 4 MG tablet [Pharmacy Med Name: Glimepiride 4 MG Oral Tablet] 90 tablet 0    Sig: Take 1 tablet by mouth once daily     Endocrinology:  Diabetes - Sulfonylureas Passed - 09/26/2021  9:45 PM      Passed - HBA1C is between 0 and 7.9 and within 180 days    Hgb A1c MFr Bld  Date Value Ref Range Status  07/11/2021 7.5 (H) <5.7 % of total Hgb Final    Comment:    For someone without known diabetes, a hemoglobin A1c value of 6.5% or greater indicates that they may have  diabetes and this should be confirmed with a follow-up  test. . For someone with known diabetes, a value <7% indicates  that their diabetes is well controlled and a value  greater than or equal to 7% indicates suboptimal  control. A1c targets should be individualized based on  duration of diabetes, age, comorbid conditions, and  other considerations. . Currently, no consensus exists regarding use of hemoglobin A1c for diagnosis of diabetes for children. .          Passed - Cr in normal range and within 360 days    Creat  Date Value Ref Range Status  07/11/2021 0.85 0.70 - 1.35 mg/dL Final         Passed - Valid encounter within last 6 months    Recent Outpatient Visits          2 months ago Controlled type 2 diabetes mellitus with complication, without long-term current use of insulin ([place])   [place] Pickard, Cammie Mcgee, MD   1 year ago Screening cholesterol level   [place] Susy Frizzle, MD   2 years ago Acute maxillary sinusitis, recurrence not specified   [place], Modena Nunnery, MD   3 years ago Controlled type 2 diabetes mellitus with complication, without long-term current use of insulin ([place])   [place] Pickard, Cammie Mcgee, MD   3 years ago Hospital discharge follow-up   [place], Warren T, MD

## 2021-10-20 ENCOUNTER — Other Ambulatory Visit: Payer: Self-pay | Admitting: Family Medicine

## 2021-10-20 ENCOUNTER — Other Ambulatory Visit: Payer: Self-pay

## 2021-10-20 DIAGNOSIS — E118 Type 2 diabetes mellitus with unspecified complications: Secondary | ICD-10-CM

## 2021-10-20 MED ORDER — CONTOUR NEXT TEST VI STRP: ORAL_STRIP | 3 refills | Status: DC

## 2021-10-31 DIAGNOSIS — M9903 Segmental and somatic dysfunction of lumbar region: Secondary | ICD-10-CM | POA: Diagnosis not present

## 2021-10-31 DIAGNOSIS — M9901 Segmental and somatic dysfunction of cervical region: Secondary | ICD-10-CM | POA: Diagnosis not present

## 2021-10-31 DIAGNOSIS — M62830 Muscle spasm of back: Secondary | ICD-10-CM | POA: Diagnosis not present

## 2021-10-31 DIAGNOSIS — M9902 Segmental and somatic dysfunction of thoracic region: Secondary | ICD-10-CM | POA: Diagnosis not present

## 2021-10-31 DIAGNOSIS — M542 Cervicalgia: Secondary | ICD-10-CM | POA: Diagnosis not present

## 2021-10-31 DIAGNOSIS — M546 Pain in thoracic spine: Secondary | ICD-10-CM | POA: Diagnosis not present

## 2021-11-03 DIAGNOSIS — M9901 Segmental and somatic dysfunction of cervical region: Secondary | ICD-10-CM | POA: Diagnosis not present

## 2021-11-03 DIAGNOSIS — M546 Pain in thoracic spine: Secondary | ICD-10-CM | POA: Diagnosis not present

## 2021-11-03 DIAGNOSIS — M542 Cervicalgia: Secondary | ICD-10-CM | POA: Diagnosis not present

## 2021-11-03 DIAGNOSIS — M9902 Segmental and somatic dysfunction of thoracic region: Secondary | ICD-10-CM | POA: Diagnosis not present

## 2021-11-03 DIAGNOSIS — M62830 Muscle spasm of back: Secondary | ICD-10-CM | POA: Diagnosis not present

## 2021-11-03 DIAGNOSIS — M9903 Segmental and somatic dysfunction of lumbar region: Secondary | ICD-10-CM | POA: Diagnosis not present

## 2021-11-08 DIAGNOSIS — M542 Cervicalgia: Secondary | ICD-10-CM | POA: Diagnosis not present

## 2021-11-08 DIAGNOSIS — M9903 Segmental and somatic dysfunction of lumbar region: Secondary | ICD-10-CM | POA: Diagnosis not present

## 2021-11-08 DIAGNOSIS — M62830 Muscle spasm of back: Secondary | ICD-10-CM | POA: Diagnosis not present

## 2021-11-08 DIAGNOSIS — M9901 Segmental and somatic dysfunction of cervical region: Secondary | ICD-10-CM | POA: Diagnosis not present

## 2021-11-08 DIAGNOSIS — M9902 Segmental and somatic dysfunction of thoracic region: Secondary | ICD-10-CM | POA: Diagnosis not present

## 2021-11-08 DIAGNOSIS — M546 Pain in thoracic spine: Secondary | ICD-10-CM | POA: Diagnosis not present

## 2021-11-15 DIAGNOSIS — M9901 Segmental and somatic dysfunction of cervical region: Secondary | ICD-10-CM | POA: Diagnosis not present

## 2021-11-15 DIAGNOSIS — M542 Cervicalgia: Secondary | ICD-10-CM | POA: Diagnosis not present

## 2021-11-15 DIAGNOSIS — M546 Pain in thoracic spine: Secondary | ICD-10-CM | POA: Diagnosis not present

## 2021-11-15 DIAGNOSIS — M9902 Segmental and somatic dysfunction of thoracic region: Secondary | ICD-10-CM | POA: Diagnosis not present

## 2021-11-15 DIAGNOSIS — M9903 Segmental and somatic dysfunction of lumbar region: Secondary | ICD-10-CM | POA: Diagnosis not present

## 2021-11-15 DIAGNOSIS — M62830 Muscle spasm of back: Secondary | ICD-10-CM | POA: Diagnosis not present

## 2021-11-20 ENCOUNTER — Other Ambulatory Visit: Payer: Self-pay | Admitting: Family Medicine

## 2021-11-21 NOTE — Telephone Encounter (Signed)
Requested Prescriptions  Pending Prescriptions Disp Refills  . amLODipine (NORVASC) 5 MG tablet [Pharmacy Med Name: amLODIPine Besylate 5 MG Oral Tablet] 90 tablet 0    Sig: Take 1 tablet by mouth once daily     Cardiovascular: Calcium Channel Blockers 2 Passed - 11/20/2021  6:19 PM      Passed - Last BP in normal range    BP Readings from Last 1 Encounters:  07/11/21 136/68         Passed - Last Heart Rate in normal range    Pulse Readings from Last 1 Encounters:  07/11/21 76         Passed - Valid encounter within last 6 months    Recent Outpatient Visits          4 months ago Controlled type 2 diabetes mellitus with complication, without long-term current use of insulin ([place])   [place] Pickard, Cammie Mcgee, MD   1 year ago Screening cholesterol level   [place] Susy Frizzle, MD   2 years ago Acute maxillary sinusitis, recurrence not specified   [place], [place] Nunnery, MD   3 years ago Controlled type 2 diabetes mellitus with complication, without long-term current use of insulin ([place])   [facility] Pickard, Cammie Mcgee, MD   3 years ago Hospital discharge follow-up   [place] Susy Frizzle, MD

## 2021-11-27 ENCOUNTER — Other Ambulatory Visit: Payer: Self-pay | Admitting: Family Medicine

## 2021-12-07 DIAGNOSIS — X32XXXD Exposure to sunlight, subsequent encounter: Secondary | ICD-10-CM | POA: Diagnosis not present

## 2021-12-07 DIAGNOSIS — I872 Venous insufficiency (chronic) (peripheral): Secondary | ICD-10-CM | POA: Diagnosis not present

## 2021-12-07 DIAGNOSIS — Z85820 Personal history of malignant melanoma of skin: Secondary | ICD-10-CM | POA: Diagnosis not present

## 2021-12-07 DIAGNOSIS — L570 Actinic keratosis: Secondary | ICD-10-CM | POA: Diagnosis not present

## 2021-12-07 DIAGNOSIS — Z08 Encounter for follow-up examination after completed treatment for malignant neoplasm: Secondary | ICD-10-CM | POA: Diagnosis not present

## 2021-12-07 DIAGNOSIS — D225 Melanocytic nevi of trunk: Secondary | ICD-10-CM | POA: Diagnosis not present

## 2021-12-07 DIAGNOSIS — Z1283 Encounter for screening for malignant neoplasm of skin: Secondary | ICD-10-CM | POA: Diagnosis not present

## 2021-12-11 ENCOUNTER — Encounter: Payer: Self-pay | Admitting: Family Medicine

## 2021-12-11 ENCOUNTER — Ambulatory Visit (INDEPENDENT_AMBULATORY_CARE_PROVIDER_SITE_OTHER): Payer: Medicare Other | Admitting: Family Medicine

## 2021-12-11 VITALS — BP 132/68 | HR 75 | Ht 73.0 in | Wt 257.2 lb

## 2021-12-11 DIAGNOSIS — L03115 Cellulitis of right lower limb: Secondary | ICD-10-CM

## 2021-12-11 MED ORDER — SEMAGLUTIDE(0.25 OR 0.5MG/DOS) 2 MG/3ML ~~LOC~~ SOPN: 0.5000 mg | PEN_INJECTOR | SUBCUTANEOUS | 1 refills | Status: DC

## 2021-12-11 MED ORDER — CEPHALEXIN 500 MG PO CAPS: 500.0000 mg | ORAL_CAPSULE | Freq: Four times a day (QID) | ORAL | 0 refills | Status: DC

## 2021-12-11 NOTE — Progress Notes (Signed)
Subjective:    Patient ID: Joel Reed., male    DOB: [birth year], 71 y.o.   MRN: [id number]  HPI  Patient presents today with redness in his right leg.  He states that his skin feels like a sunburn especially around his knee.  The skin there is erythematous and warm and hot to the touch compared to his left knee.  He denies any penetrating wounds.  He has no history of gout.  He states that he can walk on his leg without significant pain.  He does have some stiffness when he comes to bending his knee but the skin itself burns and hurts.  There is no asymmetric edema Past Medical History:  Diagnosis Date   Allergy    seasonal   Celiac disease    Cirrhosis ([place])    Diabetes mellitus without complication ([place])    Gastritis and duodenitis    on egd 12/2017   Hyperlipidemia    Patient denies   Hypertension    IDA (iron deficiency anemia)    Internal hemorrhoids    Nephrolithiasis    PVD (peripheral vascular disease) ([place])    decreased TBI in right foot.   Right buttock pain 07/29/2018   Sleep apnea    no cpap   Splenomegaly    Tubular adenoma of colon    Past Surgical History:  Procedure Laterality Date   COLONOSCOPY     JOINT REPLACEMENT     left hip Dr Noemi [place]   POLYPECTOMY     SPINE SURGERY     disectomy   UPPER GASTROINTESTINAL ENDOSCOPY  12/16/2017   Current Outpatient Medications on File Prior to Visit  Medication Sig Dispense Refill   amLODipine (NORVASC) 5 MG tablet Take 1 tablet by mouth once daily 90 tablet 0   fluticasone (FLONASE) 50 MCG/ACT nasal spray Place 2 sprays into both nostrils daily. 16 g 2   glimepiride (AMARYL) 4 MG tablet Take 1 tablet by mouth once daily 90 tablet 0   glucose blood (CONTOUR NEXT TEST) test strip USE 1 STRIP TO CHECK GLUCOSE ONCE DAILY 100 each 3   ketoconazole (NIZORAL) 2 % cream Apply topically 2 (two) times daily.     losartan (COZAAR) 100 MG tablet Take 1 tablet by mouth once daily 90 tablet 3   metFORMIN (GLUCOPHAGE) 1000  MG tablet TAKE 1 TABLET BY MOUTH TWICE DAILY WITH A MEAL 180 tablet 3   metoprolol succinate (TOPROL-XL) 25 MG 24 hr tablet Take 1 tablet by mouth once daily 90 tablet 3   vitamin B-12 (CYANOCOBALAMIN) 500 MCG tablet Take 500 mcg by mouth daily.     No current facility-administered medications on file prior to visit.   Allergies  Allergen Reactions   Gluten Meal    Social History   Socioeconomic History   Marital status: Married    Spouse name: Not on file   Number of children: Not on file   Years of education: Not on file   Highest education level: Not on file  Occupational History   Not on file  Tobacco Use   Smoking status: Never   Smokeless tobacco: Never  Vaping Use   Vaping Use: Never used  Substance and Sexual Activity   Alcohol use: No    Comment: quit 1990   Drug use: No   Sexual activity: Yes  Other Topics Concern   Not on file  Social History Narrative   Not on file   Social Determinants of Health  Financial Resource Strain: Not on file  Food Insecurity: Not on file  Transportation Needs: Not on file  Physical Activity: Not on file  Stress: Not on file  Social Connections: Not on file  Intimate Partner Violence: Not on file   Family History  Problem Relation Age of Onset   Esophageal cancer Father    Breast cancer Sister        bone cancer   Heart attack Brother 61   Cancer - Other Sister        cervical?   Colon polyps Mother    Colon cancer Neg Hx    Rectal cancer Neg Hx    Stomach cancer Neg Hx    Father died from throat cancer  Review of Systems     Objective:   Physical Exam Vitals reviewed.  Constitutional:      General: He is not in acute distress.    Appearance: He is well-developed. He is not diaphoretic.  HENT:     Head: Normocephalic and atraumatic.     Mouth/Throat:     Pharynx: No oropharyngeal exudate.  Eyes:     General: No scleral icterus.       Right eye: No discharge.        Left eye: No discharge.      Conjunctiva/sclera: Conjunctivae normal.     Pupils: Pupils are equal, round, and reactive to light.  Neck:     Thyroid: No thyromegaly.     Vascular: No JVD.  Cardiovascular:     Rate and Rhythm: Normal rate and regular rhythm.     Heart sounds: Normal heart sounds. No murmur heard.    No friction rub. No gallop.  Pulmonary:     Effort: Pulmonary effort is normal. No respiratory distress.     Breath sounds: Normal breath sounds. No wheezing or rales.  Chest:     Chest wall: No tenderness.  Abdominal:     General: Bowel sounds are normal. There is no distension.     Palpations: Abdomen is soft. There is no mass.     Tenderness: There is no abdominal tenderness. There is no guarding or rebound.  Musculoskeletal:        General: No tenderness or deformity. Normal range of motion.     Right lower leg: No edema.     Left lower leg: No edema.  Skin:    General: Skin is warm and dry.     Coloration: Skin is not pale.     Findings: Erythema present. No rash.  Neurological:     Mental Status: He is alert and oriented to person, place, and time.     Cranial Nerves: No cranial nerve deficit.     Motor: No abnormal muscle tone.     Coordination: Coordination normal.     Deep Tendon Reflexes: Reflexes are normal and symmetric.  Psychiatric:        Behavior: Behavior normal.        Thought Content: Thought content normal.        Judgment: Judgment normal.           Assessment & Plan:  Cellulitis of right leg This seems to be external to the knee joint.  The skin is erythematous warm and painful.  Begin Keflex 400 mg 4 times daily for 1 week.  Recheck in 48 hours if no better or sooner if worsening.  We also discussed starting Ozempic 0.5 mg subcu weekly in addition to his metformin and  glimepiride.  In May, his A1c was 7.5 and he states that his sugars have only worsened since that time.  He has a history of cirrhosis secondary to fatty liver disease so I feel that weight loss would  help 1 to 4 months.  Therefore we will did not start the Ozempic and I recommended coming in fasting next week and an office visit so that we can check his CMP, A1c, lipid panel.

## 2021-12-12 ENCOUNTER — Telehealth: Payer: Self-pay

## 2021-12-12 NOTE — Telephone Encounter (Signed)
FYI:  Pt wanted to let you know that he has not started the Keflex yet for his knee because [facility] just got the medication in today. I advised pt to call office on Thursday to give [place] an update on his knee. Pt verbalized understanding of all.

## 2021-12-18 ENCOUNTER — Ambulatory Visit (INDEPENDENT_AMBULATORY_CARE_PROVIDER_SITE_OTHER): Payer: Medicare Other | Admitting: Family Medicine

## 2021-12-18 VITALS — BP 126/78 | HR 75 | Ht 73.0 in | Wt 255.2 lb

## 2021-12-18 DIAGNOSIS — L03115 Cellulitis of right lower limb: Secondary | ICD-10-CM

## 2021-12-18 DIAGNOSIS — K760 Fatty (change of) liver, not elsewhere classified: Secondary | ICD-10-CM

## 2021-12-18 DIAGNOSIS — E118 Type 2 diabetes mellitus with unspecified complications: Secondary | ICD-10-CM | POA: Diagnosis not present

## 2021-12-18 MED ORDER — LOSARTAN POTASSIUM 100 MG PO TABS: 100.0000 mg | ORAL_TABLET | Freq: Every day | ORAL | 3 refills | Status: DC

## 2021-12-18 MED ORDER — METOPROLOL SUCCINATE ER 25 MG PO TB24: 25.0000 mg | ORAL_TABLET | Freq: Every day | ORAL | 3 refills | Status: DC

## 2021-12-18 NOTE — Progress Notes (Signed)
Subjective:    Patient ID: Joel Reed., male    DOB: 1950/06/24, 71 y.o.   MRN: [id number]  HPI 12/11/21  Patient presents today with redness in his right leg.  He states that his skin feels like a sunburn especially around his knee.  The skin there is erythematous and warm and hot to the touch compared to his left knee.  He denies any penetrating wounds.  He has no history of gout.  He states that he can walk on his leg without significant pain.  He does have some stiffness when he comes to bending his knee but the skin itself burns and hurts.  There is no asymmetric edema.  At that time, my plan was: This seems to be external to the knee joint.  The skin is erythematous warm and painful.  Begin Keflex 400 mg 4 times daily for 1 week.  Recheck in 48 hours if no better or sooner if worsening.  We also discussed starting Ozempic 0.5 mg subcu weekly in addition to his metformin and glimepiride.  In May, his A1c was 7.5 and he states that his sugars have only worsened since that time.  He has a history of cirrhosis secondary to fatty liver disease so I feel that weight loss would help 1 to 4 months.  Therefore we will did not start the Ozempic and I recommended coming in fasting next week at an office visit so that we can check his CMP, A1c, lipid panel.  12/18/21 The redness in his right leg has faded dramatically.  There is no erythema today.  There is no warmth.  There is no swelling.  He denies any pain.  At this point I want to focus on his diabetes.  He will be an excellent candidate for a GLP-1 agonist because weight loss would help not only his diabetes but also his fatty liver disease.  However in the past he was going to have to pay over $400 a month.  This seems ridiculous.  I do not know why his insurance would not cover 1 of these medications better whether it would be Trulicity or Ozempic Past Medical History:  Diagnosis Date   Allergy    seasonal   Celiac disease    Cirrhosis  ([place])    Diabetes mellitus without complication ([place])    Gastritis and duodenitis    on egd 12/2017   Hyperlipidemia    Patient denies   Hypertension    IDA (iron deficiency anemia)    Internal hemorrhoids    Nephrolithiasis    PVD (peripheral vascular disease) ([facility])    decreased TBI in right foot.   Right buttock pain 07/29/2018   Sleep apnea    no cpap   Splenomegaly    Tubular adenoma of colon    Past Surgical History:  Procedure Laterality Date   COLONOSCOPY     JOINT REPLACEMENT     left hip Dr Noemi [place]   POLYPECTOMY     SPINE SURGERY     disectomy   UPPER GASTROINTESTINAL ENDOSCOPY  12/16/2017   Current Outpatient Medications on File Prior to Visit  Medication Sig Dispense Refill   amLODipine (NORVASC) 5 MG tablet Take 1 tablet by mouth once daily 90 tablet 0   cephALEXin (KEFLEX) 500 MG capsule Take 1 capsule (500 mg total) by mouth 4 (four) times daily. 28 capsule 0   fluticasone (FLONASE) 50 MCG/ACT nasal spray Place 2 sprays into both nostrils daily. 16 g  2   glimepiride (AMARYL) 4 MG tablet Take 1 tablet by mouth once daily 90 tablet 0   glucose blood (CONTOUR NEXT TEST) test strip USE 1 STRIP TO CHECK GLUCOSE ONCE DAILY 100 each 3   ketoconazole (NIZORAL) 2 % cream Apply topically 2 (two) times daily.     losartan (COZAAR) 100 MG tablet Take 1 tablet by mouth once daily 90 tablet 3   metFORMIN (GLUCOPHAGE) 1000 MG tablet TAKE 1 TABLET BY MOUTH TWICE DAILY WITH A MEAL 180 tablet 3   metoprolol succinate (TOPROL-XL) 25 MG 24 hr tablet Take 1 tablet by mouth once daily 90 tablet 3   Semaglutide,0.25 or 0.5MG/DOS, 2 MG/3ML SOPN Inject 0.5 mg into the skin once a week. 3 mL 1   vitamin B-12 (CYANOCOBALAMIN) 500 MCG tablet Take 500 mcg by mouth daily.     No current facility-administered medications on file prior to visit.   Allergies  Allergen Reactions   Gluten Meal    Social History   Socioeconomic History   Marital status: Married    Spouse name: Not on file    Number of children: Not on file   Years of education: Not on file   Highest education level: Not on file  Occupational History   Not on file  Tobacco Use   Smoking status: Never   Smokeless tobacco: Never  Vaping Use   Vaping Use: Never used  Substance and Sexual Activity   Alcohol use: No    Comment: quit 1990   Drug use: No   Sexual activity: Yes  Other Topics Concern   Not on file  Social History Narrative   Not on file   Social Determinants of Health   Financial Resource Strain: Not on file  Food Insecurity: Not on file  Transportation Needs: Not on file  Physical Activity: Not on file  Stress: Not on file  Social Connections: Not on file  Intimate Partner Violence: Not on file   Family History  Problem Relation Age of Onset   Esophageal cancer Father    Breast cancer Sister        bone cancer   Heart attack Brother 25   Cancer - Other Sister        cervical?   Colon polyps Mother    Colon cancer Neg Hx    Rectal cancer Neg Hx    Stomach cancer Neg Hx    Father died from throat cancer  Review of Systems     Objective:   Physical Exam Vitals reviewed.  Constitutional:      General: He is not in acute distress.    Appearance: He is well-developed. He is not diaphoretic.  HENT:     Head: Normocephalic and atraumatic.     Mouth/Throat:     Pharynx: No oropharyngeal exudate.  Eyes:     General: No scleral icterus.       Right eye: No discharge.        Left eye: No discharge.     Conjunctiva/sclera: Conjunctivae normal.     Pupils: Pupils are equal, round, and reactive to light.  Neck:     Thyroid: No thyromegaly.     Vascular: No JVD.  Cardiovascular:     Rate and Rhythm: Normal rate and regular rhythm.     Heart sounds: Normal heart sounds. No murmur heard.    No friction rub. No gallop.  Pulmonary:     Effort: Pulmonary effort is normal. No respiratory distress.  Breath sounds: Normal breath sounds. No wheezing or rales.  Chest:      Chest wall: No tenderness.  Abdominal:     General: Bowel sounds are normal. There is no distension.     Palpations: Abdomen is soft. There is no mass.     Tenderness: There is no abdominal tenderness. There is no guarding or rebound.  Musculoskeletal:        General: No tenderness or deformity. Normal range of motion.     Right lower leg: No edema.     Left lower leg: No edema.  Skin:    General: Skin is warm and dry.     Coloration: Skin is not pale.     Findings: No erythema or rash.  Neurological:     Mental Status: He is alert and oriented to person, place, and time.     Cranial Nerves: No cranial nerve deficit.     Motor: No abnormal muscle tone.     Coordination: Coordination normal.     Deep Tendon Reflexes: Reflexes are normal and symmetric.  Psychiatric:        Behavior: Behavior normal.        Thought Content: Thought content normal.        Judgment: Judgment normal.           Assessment & Plan:  Cellulitis of right leg  NAFLD (nonalcoholic fatty liver disease) - Plan: COMPLETE METABOLIC PANEL WITH GFR, Hemoglobin A1c, Lipid panel, Protein / Creatinine Ratio, Urine  Controlled type 2 diabetes mellitus with complication, without long-term current use of insulin ([facility]) - Plan: COMPLETE METABOLIC PANEL WITH GFR, Hemoglobin A1c, Lipid panel, Protein / Creatinine Ratio, Urine Patient cellulitis is much better.  Finish the Keflex.  Get fasting lab work today including an A1c and a lipid panel.  Ideally I would like his A1c to be below 6.5.  My plan is to take him off glimepiride and replace with either Trulicity or Ozempic which ever his insurance will cover to help facilitate weight loss which I think would also help address his fatty liver disease

## 2021-12-19 ENCOUNTER — Other Ambulatory Visit: Payer: Self-pay

## 2021-12-19 LAB — HEMOGLOBIN A1C
Hgb A1c MFr Bld: 8.1 % of total Hgb — ABNORMAL HIGH (ref <5.7)
Mean Plasma Glucose: 186 mg/dL
eAG (mmol/L): 10.3 mmol/L

## 2021-12-19 LAB — LIPID PANEL
Cholesterol: 156 mg/dL (ref <200)
HDL: 45 mg/dL (ref >=40)
LDL Cholesterol (Calc): 92 mg/dL (calc)
Non-HDL Cholesterol (Calc): 111 mg/dL (calc) (ref <130)
Total CHOL/HDL Ratio: 3.5 (calc) (ref <5.0)
Triglycerides: 92 mg/dL (ref <150)

## 2021-12-19 LAB — COMPLETE METABOLIC PANEL WITH GFR
AG Ratio: 1.3 (calc) (ref 1.0–2.5)
ALT: 68 U/L — ABNORMAL HIGH (ref 9–46)
AST: 45 U/L — ABNORMAL HIGH (ref 10–35)
Albumin: 4 g/dL (ref 3.6–5.1)
Alkaline phosphatase (APISO): 57 U/L (ref 35–144)
BUN: 15 mg/dL (ref 7–25)
CO2: 27 mmol/L (ref 20–32)
Calcium: 9.1 mg/dL (ref 8.6–10.3)
Chloride: 103 mmol/L (ref 98–110)
Creat: 0.72 mg/dL (ref 0.70–1.35)
Globulin: 3.1 g/dL (calc) (ref 1.9–3.7)
Glucose, Bld: 223 mg/dL — ABNORMAL HIGH (ref 65–99)
Potassium: 4 mmol/L (ref 3.5–5.3)
Sodium: 139 mmol/L (ref 135–146)
Total Bilirubin: 0.9 mg/dL (ref 0.2–1.2)
Total Protein: 7.1 g/dL (ref 6.1–8.1)
eGFR: 99 mL/min/{1.73_m2} (ref >=60)

## 2021-12-19 LAB — PROTEIN / CREATININE RATIO, URINE
Creatinine, Urine: 100 mg/dL (ref 20–320)
Protein/Creat Ratio: 280 mg/g creat — ABNORMAL HIGH (ref 25–148)
Protein/Creatinine Ratio: 0.28 mg/mg creat — ABNORMAL HIGH (ref 0.025–0.148)
Total Protein, Urine: 28 mg/dL — ABNORMAL HIGH (ref 5–25)

## 2021-12-19 MED ORDER — TRULICITY 0.75 MG/0.5ML ~~LOC~~ SOAJ: SUBCUTANEOUS | 1 refills | Status: DC

## 2021-12-30 DIAGNOSIS — K7460 Unspecified cirrhosis of liver: Secondary | ICD-10-CM | POA: Diagnosis not present

## 2021-12-30 DIAGNOSIS — K766 Portal hypertension: Secondary | ICD-10-CM | POA: Diagnosis not present

## 2021-12-30 DIAGNOSIS — K7469 Other cirrhosis of liver: Secondary | ICD-10-CM | POA: Diagnosis not present

## 2022-01-08 ENCOUNTER — Other Ambulatory Visit: Payer: Self-pay | Admitting: Family Medicine

## 2022-01-08 NOTE — Telephone Encounter (Signed)
PT NEED APPT W/PCP FOR FUTURE REFILLS

## 2022-01-12 ENCOUNTER — Other Ambulatory Visit: Payer: Self-pay

## 2022-01-12 DIAGNOSIS — E538 Deficiency of other specified B group vitamins: Secondary | ICD-10-CM

## 2022-01-12 DIAGNOSIS — E118 Type 2 diabetes mellitus with unspecified complications: Secondary | ICD-10-CM

## 2022-01-12 DIAGNOSIS — E785 Hyperlipidemia, unspecified: Secondary | ICD-10-CM

## 2022-01-12 DIAGNOSIS — K900 Celiac disease: Secondary | ICD-10-CM

## 2022-01-12 DIAGNOSIS — J0140 Acute pansinusitis, unspecified: Secondary | ICD-10-CM

## 2022-01-12 DIAGNOSIS — I10 Essential (primary) hypertension: Secondary | ICD-10-CM

## 2022-01-12 MED ORDER — METOPROLOL SUCCINATE ER 25 MG PO TB24: 25.0000 mg | ORAL_TABLET | Freq: Every day | ORAL | 3 refills | Status: DC

## 2022-01-12 MED ORDER — METFORMIN HCL 1000 MG PO TABS: 1000.0000 mg | ORAL_TABLET | Freq: Two times a day (BID) | ORAL | 3 refills | Status: DC

## 2022-01-12 MED ORDER — CYANOCOBALAMIN 500 MCG PO TABS: 500.0000 ug | ORAL_TABLET | Freq: Every day | ORAL | 3 refills | Status: AC

## 2022-01-12 MED ORDER — GLIMEPIRIDE 4 MG PO TABS: 4.0000 mg | ORAL_TABLET | Freq: Every day | ORAL | 3 refills | Status: DC

## 2022-01-12 MED ORDER — FLUTICASONE PROPIONATE 50 MCG/ACT NA SUSP: 2.0000 | Freq: Every day | NASAL | 2 refills | Status: DC

## 2022-01-12 MED ORDER — LOSARTAN POTASSIUM 100 MG PO TABS: 100.0000 mg | ORAL_TABLET | Freq: Every day | ORAL | 3 refills | Status: DC

## 2022-01-12 MED ORDER — AMLODIPINE BESYLATE 5 MG PO TABS: 5.0000 mg | ORAL_TABLET | Freq: Every day | ORAL | 3 refills | Status: DC

## 2022-01-12 MED ORDER — CONTOUR NEXT TEST VI STRP: ORAL_STRIP | 3 refills | Status: DC

## 2022-01-12 MED ORDER — TRULICITY 0.75 MG/0.5ML ~~LOC~~ SOAJ: SUBCUTANEOUS | 3 refills | Status: DC

## 2022-01-16 ENCOUNTER — Encounter: Payer: Self-pay | Admitting: Family Medicine

## 2022-01-16 ENCOUNTER — Ambulatory Visit (INDEPENDENT_AMBULATORY_CARE_PROVIDER_SITE_OTHER): Payer: Medicare Other | Admitting: Family Medicine

## 2022-01-16 ENCOUNTER — Ambulatory Visit (HOSPITAL_COMMUNITY)
Admission: RE | Admit: 2022-01-16 | Discharge: 2022-01-16 | Disposition: A | Discharge status: Home / Self Care | Payer: Medicare Other | Source: Ambulatory Visit | Attending: Family Medicine | Admitting: Family Medicine

## 2022-01-16 VITALS — BP 124/62 | HR 76 | Ht 73.0 in | Wt 255.2 lb

## 2022-01-16 DIAGNOSIS — M5136 Other intervertebral disc degeneration, lumbar region: Secondary | ICD-10-CM | POA: Diagnosis not present

## 2022-01-16 DIAGNOSIS — K760 Fatty (change of) liver, not elsewhere classified: Secondary | ICD-10-CM

## 2022-01-16 DIAGNOSIS — M5431 Sciatica, right side: Secondary | ICD-10-CM | POA: Diagnosis not present

## 2022-01-16 MED ORDER — PREDNISONE 20 MG PO TABS: ORAL_TABLET | ORAL | 0 refills | Status: DC

## 2022-01-16 NOTE — Progress Notes (Signed)
Subjective:    Patient ID: Joel Gums., male    DOB: 1950-06-28, 71 y.o.   MRN: [id number]  Back Pain  Medication Refill  Patient reports a 2-week history of severe pain in his lower back radiating into his right posterior hip down his right lateral leg into his right foot.  He has a history of degenerative disc disease in the lumbar spine and had surgery at L4-L5 in the past per his report in 1988.  He denies any saddle anesthesias or bowel or bladder incontinence.  On his recent lab work, his AST and ALT were elevated as was his A1c.  He has yet to start Ozempic as recommended.  He is thought to have fatty liver disease.  Past Medical History:  Diagnosis Date   Allergy    seasonal   Celiac disease    Cirrhosis ([place])    Diabetes mellitus without complication ([place])    Gastritis and duodenitis    on egd 12/2017   Hyperlipidemia    Patient denies   Hypertension    IDA (iron deficiency anemia)    Internal hemorrhoids    Nephrolithiasis    PVD (peripheral vascular disease) ([place])    decreased TBI in right foot.   Right buttock pain 07/29/2018   Sleep apnea    no cpap   Splenomegaly    Tubular adenoma of colon    Past Surgical History:  Procedure Laterality Date   COLONOSCOPY     JOINT REPLACEMENT     left hip Dr Noemi Chapel   POLYPECTOMY     SPINE SURGERY     disectomy   UPPER GASTROINTESTINAL ENDOSCOPY  12/16/2017   Current Outpatient Medications on File Prior to Visit  Medication Sig Dispense Refill   amLODipine (NORVASC) 5 MG tablet Take 1 tablet (5 mg total) by mouth daily. 90 tablet 3   cyanocobalamin (VITAMIN B12) 500 MCG tablet Take 1 tablet (500 mcg total) by mouth daily. 90 tablet 3   fluticasone (FLONASE) 50 MCG/ACT nasal spray Place 2 sprays into both nostrils daily. 16 g 2   glimepiride (AMARYL) 4 MG tablet Take 1 tablet (4 mg total) by mouth daily. 90 tablet 3   glucose blood (CONTOUR NEXT TEST) test strip USE 1 STRIP TO CHECK GLUCOSE ONCE DAILY 300 strip 3    ketoconazole (NIZORAL) 2 % cream Apply topically 2 (two) times daily.     losartan (COZAAR) 100 MG tablet Take 1 tablet (100 mg total) by mouth daily. 90 tablet 3   metFORMIN (GLUCOPHAGE) 1000 MG tablet Take 1 tablet (1,000 mg total) by mouth 2 (two) times daily with a meal. 180 tablet 3   metoprolol succinate (TOPROL-XL) 25 MG 24 hr tablet Take 1 tablet (25 mg total) by mouth daily. 90 tablet 3   No current facility-administered medications on file prior to visit.   Allergies  Allergen Reactions   Gluten Meal    Social History   Socioeconomic History   Marital status: Married    Spouse name: Not on file   Number of children: Not on file   Years of education: Not on file   Highest education level: Not on file  Occupational History   Not on file  Tobacco Use   Smoking status: Never   Smokeless tobacco: Never  Vaping Use   Vaping Use: Never used  Substance and Sexual Activity   Alcohol use: No    Comment: quit 1990   Drug use: No  Sexual activity: Yes  Other Topics Concern   Not on file  Social History Narrative   Not on file   Social Determinants of Health   Financial Resource Strain: Not on file  Food Insecurity: Not on file  Transportation Needs: Not on file  Physical Activity: Not on file  Stress: Not on file  Social Connections: Not on file  Intimate Partner Violence: Not on file   Family History  Problem Relation Age of Onset   Esophageal cancer Father    Breast cancer Sister        bone cancer   Heart attack Brother 33   Cancer - Other Sister        cervical?   Colon polyps Mother    Colon cancer Neg Hx    Rectal cancer Neg Hx    Stomach cancer Neg Hx    Father died from throat cancer  Review of Systems  Musculoskeletal:  Positive for back pain.       Objective:   Physical Exam Vitals reviewed.  Constitutional:      General: He is not in acute distress.    Appearance: He is well-developed. He is not diaphoretic.  HENT:     Head:  Normocephalic and atraumatic.     Right Ear: External ear normal.     Left Ear: External ear normal.     Nose: Nose normal.     Mouth/Throat:     Pharynx: No oropharyngeal exudate.  Eyes:     General: No scleral icterus.       Right eye: No discharge.        Left eye: No discharge.     Conjunctiva/sclera: Conjunctivae normal.     Pupils: Pupils are equal, round, and reactive to light.  Neck:     Thyroid: No thyromegaly.     Vascular: No JVD.  Cardiovascular:     Rate and Rhythm: Normal rate and regular rhythm.     Heart sounds: Normal heart sounds. No murmur heard.    No friction rub. No gallop.  Pulmonary:     Effort: Pulmonary effort is normal. No respiratory distress.     Breath sounds: Normal breath sounds. No wheezing or rales.  Chest:     Chest wall: No tenderness.  Abdominal:     General: Bowel sounds are normal. There is no distension.     Palpations: Abdomen is soft. There is no mass.     Tenderness: There is no abdominal tenderness. There is no guarding or rebound.  Musculoskeletal:        General: No tenderness or deformity. Normal range of motion.     Cervical back: Neck supple.  Lymphadenopathy:     Cervical: No cervical adenopathy.  Skin:    General: Skin is warm and dry.     Coloration: Skin is not pale.     Findings: No erythema or rash.  Neurological:     Mental Status: He is alert and oriented to person, place, and time.     Cranial Nerves: No cranial nerve deficit.     Motor: No abnormal muscle tone.     Coordination: Coordination normal.     Deep Tendon Reflexes: Reflexes are normal and symmetric.  Psychiatric:        Behavior: Behavior normal.        Thought Content: Thought content normal.        Judgment: Judgment normal.           Assessment &  Plan:  Right sided sciatica - Plan: DG Lumbar Spine Complete  Fatty liver disease, nonalcoholic - Plan: Liver Fibrosis, Hepatic Function Panel with Fibrosis-4 (FIB-4) Index Symptoms suggest right  sciatica.  I will start the patient on prednisone taper pack and obtain an x-ray of the lumbar spine to evaluate further.  Discussed the natural history that states 90% of sciatica improved within 6 weeks.  If not improving or if worsening we may need to consider an MRI.  CheckFIB-4 pertaining to his fatty liver disease due to evaluate for possible fibrosis.  Strongly encouraged 25 pounds of weight loss and start Ozempic

## 2022-01-18 ENCOUNTER — Telehealth: Payer: Self-pay | Admitting: Family Medicine

## 2022-01-18 LAB — LIVER FIBROSIS,HEPATIC FUNCTION PANEL W/FIBROSIS-4(FIB-4)INDEX
AG Ratio: 1.4 (calc) (ref 1.0–2.5)
ALT: 49 U/L — ABNORMAL HIGH (ref 9–46)
AST: 33 U/L (ref 10–35)
Albumin: 4.2 g/dL (ref 3.6–5.1)
Alkaline phosphatase (APISO): 53 U/L (ref 35–144)
Bilirubin, Direct: 0.3 mg/dL — ABNORMAL HIGH (ref 0.0–0.2)
Globulin: 3.1 g/dL (calc) (ref 1.9–3.7)
Indirect Bilirubin: 0.7 mg/dL (calc) (ref 0.2–1.2)
Total Bilirubin: 1 mg/dL (ref 0.2–1.2)
Total Protein: 7.3 g/dL (ref 6.1–8.1)

## 2022-01-18 NOTE — Telephone Encounter (Signed)
Left message for patient to call back and schedule Medicare Annual Wellness Visit (AWV) in office.   If not able to come in office, please offer to do virtually or by telephone.   Last AWV: 05/24/2020   Please schedule at anytime with [facility]  If any questions, please contact me at [phone].  Thank you ,  Colletta [place]

## 2022-01-19 ENCOUNTER — Other Ambulatory Visit: Payer: Self-pay

## 2022-01-19 ENCOUNTER — Other Ambulatory Visit: Payer: Self-pay | Admitting: Family Medicine

## 2022-01-19 DIAGNOSIS — K7460 Unspecified cirrhosis of liver: Secondary | ICD-10-CM

## 2022-01-19 DIAGNOSIS — K760 Fatty (change of) liver, not elsewhere classified: Secondary | ICD-10-CM

## 2022-01-19 DIAGNOSIS — E118 Type 2 diabetes mellitus with unspecified complications: Secondary | ICD-10-CM

## 2022-01-19 MED ORDER — OZEMPIC (0.25 OR 0.5 MG/DOSE) 2 MG/3ML ~~LOC~~ SOPN: 0.5000 mg | PEN_INJECTOR | SUBCUTANEOUS | 1 refills | Status: DC

## 2022-01-19 MED ORDER — PIOGLITAZONE HCL 30 MG PO TABS: 30.0000 mg | ORAL_TABLET | Freq: Every day | ORAL | 1 refills | Status: DC

## 2022-01-25 DIAGNOSIS — Z86010 Personal history of colonic polyps: Secondary | ICD-10-CM | POA: Diagnosis not present

## 2022-01-25 DIAGNOSIS — K769 Liver disease, unspecified: Secondary | ICD-10-CM | POA: Insufficient documentation

## 2022-01-25 DIAGNOSIS — K766 Portal hypertension: Secondary | ICD-10-CM | POA: Diagnosis not present

## 2022-01-25 DIAGNOSIS — I10 Essential (primary) hypertension: Secondary | ICD-10-CM | POA: Diagnosis not present

## 2022-01-25 DIAGNOSIS — Z79899 Other long term (current) drug therapy: Secondary | ICD-10-CM | POA: Diagnosis not present

## 2022-01-25 DIAGNOSIS — K7581 Nonalcoholic steatohepatitis (NASH): Secondary | ICD-10-CM | POA: Diagnosis not present

## 2022-01-25 DIAGNOSIS — Z6834 Body mass index (BMI) 34.0-34.9, adult: Secondary | ICD-10-CM | POA: Diagnosis not present

## 2022-01-25 DIAGNOSIS — Z23 Encounter for immunization: Secondary | ICD-10-CM | POA: Diagnosis not present

## 2022-01-25 DIAGNOSIS — R600 Localized edema: Secondary | ICD-10-CM | POA: Insufficient documentation

## 2022-01-25 DIAGNOSIS — E119 Type 2 diabetes mellitus without complications: Secondary | ICD-10-CM | POA: Diagnosis not present

## 2022-01-25 DIAGNOSIS — K7460 Unspecified cirrhosis of liver: Secondary | ICD-10-CM | POA: Diagnosis not present

## 2022-01-25 DIAGNOSIS — E785 Hyperlipidemia, unspecified: Secondary | ICD-10-CM | POA: Diagnosis not present

## 2022-01-25 DIAGNOSIS — E669 Obesity, unspecified: Secondary | ICD-10-CM | POA: Diagnosis not present

## 2022-01-25 DIAGNOSIS — Z1159 Encounter for screening for other viral diseases: Secondary | ICD-10-CM | POA: Diagnosis not present

## 2022-01-31 DIAGNOSIS — M9905 Segmental and somatic dysfunction of pelvic region: Secondary | ICD-10-CM | POA: Diagnosis not present

## 2022-01-31 DIAGNOSIS — M9902 Segmental and somatic dysfunction of thoracic region: Secondary | ICD-10-CM | POA: Diagnosis not present

## 2022-01-31 DIAGNOSIS — M546 Pain in thoracic spine: Secondary | ICD-10-CM | POA: Diagnosis not present

## 2022-01-31 DIAGNOSIS — M9903 Segmental and somatic dysfunction of lumbar region: Secondary | ICD-10-CM | POA: Diagnosis not present

## 2022-01-31 DIAGNOSIS — M5441 Lumbago with sciatica, right side: Secondary | ICD-10-CM | POA: Diagnosis not present

## 2022-03-01 DIAGNOSIS — Z08 Encounter for follow-up examination after completed treatment for malignant neoplasm: Secondary | ICD-10-CM | POA: Diagnosis not present

## 2022-03-01 DIAGNOSIS — Z1283 Encounter for screening for malignant neoplasm of skin: Secondary | ICD-10-CM | POA: Diagnosis not present

## 2022-03-01 DIAGNOSIS — D225 Melanocytic nevi of trunk: Secondary | ICD-10-CM | POA: Diagnosis not present

## 2022-03-01 DIAGNOSIS — L308 Other specified dermatitis: Secondary | ICD-10-CM | POA: Diagnosis not present

## 2022-03-01 DIAGNOSIS — X32XXXD Exposure to sunlight, subsequent encounter: Secondary | ICD-10-CM | POA: Diagnosis not present

## 2022-03-01 DIAGNOSIS — L570 Actinic keratosis: Secondary | ICD-10-CM | POA: Diagnosis not present

## 2022-03-01 DIAGNOSIS — Z85820 Personal history of malignant melanoma of skin: Secondary | ICD-10-CM | POA: Diagnosis not present

## 2022-03-18 ENCOUNTER — Other Ambulatory Visit: Payer: Self-pay | Admitting: Family Medicine

## 2022-03-18 DIAGNOSIS — K7460 Unspecified cirrhosis of liver: Secondary | ICD-10-CM

## 2022-03-18 DIAGNOSIS — E118 Type 2 diabetes mellitus with unspecified complications: Secondary | ICD-10-CM

## 2022-03-18 DIAGNOSIS — K760 Fatty (change of) liver, not elsewhere classified: Secondary | ICD-10-CM

## 2022-03-21 NOTE — Patient Instructions (Signed)
Joel Reed , Thank you for taking time to come for your Medicare Wellness Visit. I appreciate your ongoing commitment to your health goals. Please review the following plan we discussed and let me know if I can assist you in the future.   These are the goals we discussed:  Goals   None     This is a list of the screening recommended for you and due dates:  Health Maintenance  Topic Date Due   Complete foot exam   02/19/2017   COVID-19 Vaccine (3 - Moderna risk series) 04/15/2020   Medicare Annual Wellness Visit  05/24/2021   Eye exam for diabetics  06/15/2022   Hemoglobin A1C  06/18/2022   Colon Cancer Screening  12/17/2022   Yearly kidney function blood test for diabetes  12/19/2022   Yearly kidney health urinalysis for diabetes  12/19/2022   Pneumonia Vaccine (3 of 3 - PPSV23 or PCV20) 11/13/2023   DTaP/Tdap/Td vaccine (2 - Td or Tdap) 02/12/2025   Flu Shot  Completed   Hepatitis C Screening: USPSTF Recommendation to screen - Ages 18-79 yo.  Completed   Zoster (Shingles) Vaccine  Completed   HPV Vaccine  Aged Out    Advanced directives: ***  Conditions/risks identified: Aim for 30 minutes of exercise or brisk walking, 6-8 glasses of water, and 5 servings of fruits and vegetables each day.   Next appointment: Follow up in one year for your annual wellness visit.   Preventive Care 70 Years and Older, Male  Preventive care refers to lifestyle choices and visits with your health care provider that can promote health and wellness. What does preventive care include? A yearly physical exam. This is also called an annual well check. Dental exams once or twice a year. Routine eye exams. Ask your health care provider how often you should have your eyes checked. Personal lifestyle choices, including: Daily care of your teeth and gums. Regular physical activity. Eating a healthy diet. Avoiding tobacco and drug use. Limiting alcohol use. Practicing safe sex. Taking low doses of  aspirin every day. Taking vitamin and mineral supplements as recommended by your health care provider. What happens during an annual well check? The services and screenings done by your health care provider during your annual well check will depend on your age, overall health, lifestyle risk factors, and family history of disease. Counseling  Your health care provider may ask you questions about your: Alcohol use. Tobacco use. Drug use. Emotional well-being. Home and relationship well-being. Sexual activity. Eating habits. History of falls. Memory and ability to understand (cognition). Work and work Statistician. Screening  You may have the following tests or measurements: Height, weight, and BMI. Blood pressure. Lipid and cholesterol levels. These may be checked every 5 years, or more frequently if you are over 48 years old. Skin check. Lung cancer screening. You may have this screening every year starting at age 2 if you have a 30-pack-year history of smoking and currently smoke or have quit within the past 15 years. Fecal occult blood test (FOBT) of the stool. You may have this test every year starting at age 16. Flexible sigmoidoscopy or colonoscopy. You may have a sigmoidoscopy every 5 years or a colonoscopy every 10 years starting at age 44. Prostate cancer screening. Recommendations will vary depending on your family history and other risks. Hepatitis C blood test. Hepatitis B blood test. Sexually transmitted disease (STD) testing. Diabetes screening. This is done by checking your blood sugar (glucose) after you have  not eaten for a while (fasting). You may have this done every 1-3 years. Abdominal aortic aneurysm (AAA) screening. You may need this if you are a current or former smoker. Osteoporosis. You may be screened starting at age 55 if you are at high risk. Talk with your health care provider about your test results, treatment options, and if necessary, the need for more  tests. Vaccines  Your health care provider may recommend certain vaccines, such as: Influenza vaccine. This is recommended every year. Tetanus, diphtheria, and acellular pertussis (Tdap, Td) vaccine. You may need a Td booster every 10 years. Zoster vaccine. You may need this after age 70. Pneumococcal 13-valent conjugate (PCV13) vaccine. One dose is recommended after age 65. Pneumococcal polysaccharide (PPSV23) vaccine. One dose is recommended after age 15. Talk to your health care provider about which screenings and vaccines you need and how often you need them. This information is not intended to replace advice given to you by your health care provider. Make sure you discuss any questions you have with your health care provider. Document Released: 02/25/2015 Document Revised: 10/19/2015 Document Reviewed: 11/30/2014 Elsevier Interactive Patient Education  2017 [employer] Prevention in the Home Falls can cause injuries. They can happen to people of all ages. There are many things you can do to make your home safe and to help prevent falls. What can I do on the outside of my home? Regularly fix the edges of walkways and driveways and fix any cracks. Remove anything that might make you trip as you walk through a door, such as a raised step or threshold. Trim any bushes or trees on the path to your home. Use bright outdoor lighting. Clear any walking paths of anything that might make someone trip, such as rocks or tools. Regularly check to see if handrails are loose or broken. Make sure that both sides of any steps have handrails. Any raised decks and porches should have guardrails on the edges. Have any leaves, snow, or ice cleared regularly. Use sand or salt on walking paths during winter. Clean up any spills in your garage right away. This includes oil or grease spills. What can I do in the bathroom? Use night lights. Install grab bars by the toilet and in the tub and shower.  Do not use towel bars as grab bars. Use non-skid mats or decals in the tub or shower. If you need to sit down in the shower, use a plastic, non-slip stool. Keep the floor dry. Clean up any water that spills on the floor as soon as it happens. Remove soap buildup in the tub or shower regularly. Attach bath mats securely with double-sided non-slip rug tape. Do not have throw rugs and other things on the floor that can make you trip. What can I do in the bedroom? Use night lights. Make sure that you have a light by your bed that is easy to reach. Do not use any sheets or blankets that are too big for your bed. They should not hang down onto the floor. Have a firm chair that has side arms. You can use this for support while you get dressed. Do not have throw rugs and other things on the floor that can make you trip. What can I do in the kitchen? Clean up any spills right away. Avoid walking on wet floors. Keep items that you use a lot in easy-to-reach places. If you need to reach something above you, use a strong step stool  that has a grab bar. Keep electrical cords out of the way. Do not use floor polish or wax that makes floors slippery. If you must use wax, use non-skid floor wax. Do not have throw rugs and other things on the floor that can make you trip. What can I do with my stairs? Do not leave any items on the stairs. Make sure that there are handrails on both sides of the stairs and use them. Fix handrails that are broken or loose. Make sure that handrails are as long as the stairways. Check any carpeting to make sure that it is firmly attached to the stairs. Fix any carpet that is loose or worn. Avoid having throw rugs at the top or bottom of the stairs. If you do have throw rugs, attach them to the floor with carpet tape. Make sure that you have a light switch at the top of the stairs and the bottom of the stairs. If you do not have them, ask someone to add them for you. What else  can I do to help prevent falls? Wear shoes that: Do not have high heels. Have rubber bottoms. Are comfortable and fit you well. Are closed at the toe. Do not wear sandals. If you use a stepladder: Make sure that it is fully opened. Do not climb a closed stepladder. Make sure that both sides of the stepladder are locked into place. Ask someone to hold it for you, if possible. Clearly mark and make sure that you can see: Any grab bars or handrails. First and last steps. Where the edge of each step is. Use tools that help you move around (mobility aids) if they are needed. These include: Canes. Walkers. Scooters. Crutches. Turn on the lights when you go into a dark area. Replace any light bulbs as soon as they burn out. Set up your furniture so you have a clear path. Avoid moving your furniture around. If any of your floors are uneven, fix them. If there are any pets around you, be aware of where they are. Review your medicines with your doctor. Some medicines can make you feel dizzy. This can increase your chance of falling. Ask your doctor what other things that you can do to help prevent falls. This information is not intended to replace advice given to you by your health care provider. Make sure you discuss any questions you have with your health care provider. Document Released: 11/25/2008 Document Revised: 07/07/2015 Document Reviewed: 03/05/2014 Elsevier Interactive Patient Education  2017 [employer].

## 2022-03-22 ENCOUNTER — Ambulatory Visit (INDEPENDENT_AMBULATORY_CARE_PROVIDER_SITE_OTHER): Payer: Medicare Other

## 2022-03-22 ENCOUNTER — Other Ambulatory Visit: Payer: Self-pay

## 2022-03-22 VITALS — Ht 73.0 in | Wt 255.0 lb

## 2022-03-22 DIAGNOSIS — Z0000 Encounter for general adult medical examination without abnormal findings: Secondary | ICD-10-CM

## 2022-03-22 DIAGNOSIS — Z79899 Other long term (current) drug therapy: Secondary | ICD-10-CM

## 2022-03-22 NOTE — Progress Notes (Signed)
Subjective:   Joel Reed. is a 72 y.o. male who presents for Medicare Annual/Subsequent preventive examination.  I connected with  Joel Reed. on 03/22/22 by a audio enabled telemedicine application and verified that I am speaking with the correct person using two identifiers.  Patient Location: Home  Provider Location: Office/Clinic  I discussed the limitations of evaluation and management by telemedicine. The patient expressed understanding and agreed to proceed.  Review of Systems     Cardiac Risk Factors include: advanced age (>3mn, >>51women);diabetes mellitus;dyslipidemia;male gender;hypertension     Objective:    Today's Vitals   03/22/22 1551  Weight: 255 lb (115.7 kg)  Height: '6\' 1"'$  (1.854 m)   Body mass index is 33.64 kg/m.     03/22/2022    3:26 PM 05/24/2020    8:31 AM 01/25/2020    2:05 PM 07/02/2018    1:48 PM 01/28/2018    3:26 PM 01/14/2018    9:21 AM 07/19/2016    3:43 PM  Advanced Directives  Does Patient Have a Medical Advance Directive? Yes Yes Yes No;Yes Yes Yes Yes  Type of Advance Directive Living will  [place] Living will Living will Living will [place] will  Does patient want to make changes to medical advance directive? No - Patient declined No - Patient declined No - Patient declined No - Patient declined No - Patient declined No - Patient declined Yes (Inpatient - patient defers changing a medical advance directive at this time)  Copy of [place] Chart?   No - copy requested    No - copy requested  Would patient like information on creating a medical advance directive?    No - Patient declined       Current Medications (verified) Outpatient Encounter Medications as of 03/22/2022  Medication Sig   amLODipine (NORVASC) 5 MG tablet Take 1 tablet (5 mg total) by mouth daily.   cyanocobalamin (VITAMIN B12) 500 MCG tablet Take 1 tablet (500 mcg total) by  mouth daily.   fluticasone (FLONASE) 50 MCG/ACT nasal spray Place 2 sprays into both nostrils daily.   glimepiride (AMARYL) 4 MG tablet Take 1 tablet (4 mg total) by mouth daily.   glucose blood (CONTOUR NEXT TEST) test strip USE 1 STRIP TO CHECK GLUCOSE ONCE DAILY   ketoconazole (NIZORAL) 2 % cream Apply topically 2 (two) times daily.   losartan (COZAAR) 100 MG tablet Take 1 tablet (100 mg total) by mouth daily.   metFORMIN (GLUCOPHAGE) 1000 MG tablet Take 1 tablet (1,000 mg total) by mouth 2 (two) times daily with a meal.   metoprolol succinate (TOPROL-XL) 25 MG 24 hr tablet Take 1 tablet (25 mg total) by mouth daily.   pioglitazone (ACTOS) 30 MG tablet Take 1 tablet by mouth once daily   [DISCONTINUED] predniSONE (DELTASONE) 20 MG tablet 3 tabs poqday 1-2, 2 tabs poqday 3-4, 1 tab poqday 5-6   Semaglutide,0.25 or 0.'5MG'$ /DOS, (OZEMPIC, 0.25 OR 0.5 MG/DOSE,) 2 MG/3ML SOPN Inject 0.5 mg into the skin once a week. (Patient not taking: Reported on 03/22/2022)   No facility-administered encounter medications on file as of 03/22/2022.    Allergies (verified) Gluten meal   History: Past Medical History:  Diagnosis Date   Allergy    seasonal   Celiac disease    Cirrhosis ([place]    Diabetes mellitus without complication ([place]    Gastritis and duodenitis    on egd  12/2017   Hyperlipidemia    Patient denies   Hypertension    IDA (iron deficiency anemia)    Internal hemorrhoids    Nephrolithiasis    PVD (peripheral vascular disease) ([place])    decreased TBI in right foot.   Right buttock pain 07/29/2018   Sleep apnea    no cpap   Splenomegaly    Tubular adenoma of colon    Past Surgical History:  Procedure Laterality Date   COLONOSCOPY     JOINT REPLACEMENT     left hip Dr Noemi Chapel   POLYPECTOMY     SPINE SURGERY     disectomy   UPPER GASTROINTESTINAL ENDOSCOPY  12/16/2017   Family History  Problem Relation Age of Onset   Esophageal cancer Father    Breast cancer Sister        bone  cancer   Heart attack Brother 31   Cancer - Other Sister        cervical?   Colon polyps Mother    Colon cancer Neg Hx    Rectal cancer Neg Hx    Stomach cancer Neg Hx    Social History   Socioeconomic History   Marital status: Married    Spouse name: Not on file   Number of children: Not on file   Years of education: Not on file   Highest education level: Not on file  Occupational History   Not on file  Tobacco Use   Smoking status: Never   Smokeless tobacco: Never  Vaping Use   Vaping Use: Never used  Substance and Sexual Activity   Alcohol use: No    Comment: quit 1990   Drug use: No   Sexual activity: Yes  Other Topics Concern   Not on file  Social History Narrative   Not on file   Social Determinants of Health   Financial Resource Strain: Low Risk  (03/22/2022)   Overall Financial Resource Strain (CARDIA)    Difficulty of Paying Living Expenses: Not hard at all  Food Insecurity: No Food Insecurity (03/22/2022)   Hunger Vital Sign    Worried About Running Out of Food in the Last Year: Never true    Ran Out of Food in the Last Year: Never true  Transportation Needs: No Transportation Needs (03/22/2022)   PRAPARE - Hydrologist (Medical): No    Lack of Transportation (Non-Medical): No  Physical Activity: Sufficiently Active (03/22/2022)   Exercise Vital Sign    Days of Exercise per Week: 5 days    Minutes of Exercise per Session: 30 min  Stress: No Stress Concern Present (03/22/2022)   [place]    Feeling of Stress : Not at all  Social Connections: [place] (03/22/2022)   Social Connection and Isolation Panel [NHANES]    Frequency of Communication with Friends and Family: More than three times a week    Frequency of Social Gatherings with Friends and Family: Three times a week    Attends Religious Services: More than 4 times per year    Active Member of Clubs or  Organizations: Yes    Attends Music therapist: More than 4 times per year    Marital Status: Married    Tobacco Counseling Counseling given: Not Answered   Clinical Intake:  Pre-visit preparation completed: Yes  Pain : No/denies pain  Diabetes: Yes CBG done?: No Did pt. bring in CBG monitor from home?:  No  How often do you need to have someone help you when you read instructions, pamphlets, or other written materials from your doctor or pharmacy?: 1 - Never  Diabetic?Yes Nutrition Risk Assessment:  Has the patient had any N/V/D within the last 2 months?  No  Does the patient have any non-healing wounds?  No  Has the patient had any unintentional weight loss or weight gain?  No   Diabetes:  Is the patient diabetic?  Yes  If diabetic, was a CBG obtained today?  No  Did the patient bring in their glucometer from home?  No  How often do you monitor your CBG's? Daily .   Financial Strains and Diabetes Management:  Are you having any financial strains with the device, your supplies or your medication? Yes .  Does the patient want to be seen by Chronic Care Management for management of their diabetes?  No  Would the patient like to be referred to a Nutritionist or for Diabetic Management?  No   Diabetic Exams:  Diabetic Eye Exam: Completed 06/14/21 Diabetic Foot Exam: Overdue, Pt has been advised about the importance in completing this exam. Pt is scheduled for diabetic foot exam on at next office visit .   Interpreter Needed?: No  Information entered by :: Denman George LPN   Activities of Daily Living    03/22/2022    3:27 PM  In your present state of health, do you have any difficulty performing the following activities:  Hearing? 0  Vision? 0  Difficulty concentrating or making decisions? 0  Walking or climbing stairs? 0  Dressing or bathing? 0  Doing errands, shopping? 0  Preparing Food and eating ? N  Using the Toilet? N  In the past six  months, have you accidently leaked urine? N  Do you have problems with loss of bowel control? N  Managing your Medications? N  Managing your Finances? N  Housekeeping or managing your Housekeeping? N    Patient Care Team: Susy Frizzle, MD as PCP - General (Family Medicine) Jonnie Finner, RN (Inactive) as Oncology Nurse Navigator Ladell Pier, MD as Consulting Physician (Oncology) Delaine Lame (Physician Assistant) Madelin Headings, DO (Optometry)  Indicate any recent Medical Services you may have received from other than [facility] providers in the past year (date may be approximate).     Assessment:   This is a routine wellness examination for [facility].  Hearing/Vision screen Hearing Screening - Comments:: Denies hearing difficulties   Vision Screening - Comments:: Wears rx glasses - up to date with routine eye exams with    Dietary issues and exercise activities discussed: Current Exercise Habits: Home exercise routine, Type of exercise: walking, Time (Minutes): 30, Frequency (Times/Week): 5, Weekly Exercise (Minutes/Week): 150, Intensity: Mild   Goals Addressed             This Visit's Progress    Maintain Health         Depression Screen    03/22/2022    3:39 PM 01/16/2022   10:08 AM 12/11/2021   10:57 AM 05/24/2020    8:27 AM 08/12/2018    9:40 AM 07/29/2018    9:59 AM 12/31/2017    3:28 PM  PHQ 2/9 Scores  PHQ - 2 Score 0 0 0 0 0 0 0  PHQ- 9 Score    0       Fall Risk    03/22/2022    3:52 PM 01/16/2022   10:08 AM  12/11/2021   10:57 AM 05/24/2020    8:27 AM 08/12/2018    9:40 AM  Fall Risk   Falls in the past year? 0 0 0 0 0  Number falls in past yr: 0 0 0    Injury with Fall? 0 0 0    Risk for fall due to :  No Fall Risks No Fall Risks No Fall Risks   Follow up Falls prevention discussed;Education provided;Falls evaluation completed Falls prevention discussed Falls prevention discussed Falls evaluation completed     FALL RISK  PREVENTION PERTAINING TO THE HOME:  Any stairs in or around the home? Yes  If so, are there any without handrails? No  Home free of loose throw rugs in walkways, pet beds, electrical cords, etc? Yes  Adequate lighting in your home to reduce risk of falls? Yes   ASSISTIVE DEVICES UTILIZED TO PREVENT FALLS:  Life alert? No  Use of a cane, walker or w/c? No  Grab bars in the bathroom? Yes  Shower chair or bench in shower? No  Elevated toilet seat or a handicapped toilet? Yes   TIMED UP AND GO:  Was the test performed? No . Telephonic visit   Cognitive Function:        03/22/2022    3:27 PM  6CIT Screen  What Year? 0 points  What month? 0 points  What time? 0 points  Count back from 20 0 points  Months in reverse 0 points  Repeat phrase 0 points  Total Score 0 points    Immunizations Immunization History  Administered Date(s) Administered   Hep A / Hep B 02/24/2020   Influenza, High Dose Seasonal PF 01/13/2020   Influenza,inj,Quad PF,6+ Mos 02/20/2016   Moderna Sars-Covid-2 Vaccination 02/19/2020, 03/18/2020   Pneumococcal Conjugate-13 11/13/2018   Pneumococcal Polysaccharide-23 03/29/2017   Tdap 02/13/2015    TDAP status: Up to date  Flu Vaccine status: Up to date  Pneumococcal vaccine status: Up to date  Covid-19 vaccine status: Information provided on how to obtain vaccines.   Qualifies for Shingles Vaccine? Yes   Zostavax completed No   Shingrix Completed?: Yes  Screening Tests Health Maintenance  Topic Date Due   FOOT EXAM  02/19/2017   COVID-19 Vaccine (3 - Moderna risk series) 04/15/2020   OPHTHALMOLOGY EXAM  06/15/2022   HEMOGLOBIN A1C  06/18/2022   COLONOSCOPY (Pts 45-16yr Insurance coverage will need to be confirmed)  12/17/2022   Diabetic kidney evaluation - eGFR measurement  12/19/2022   Diabetic kidney evaluation - Urine ACR  12/19/2022   Medicare Annual Wellness (AWV)  03/23/2023   Pneumonia Vaccine 70 Years old (3 of 3 - PPSV23 or  PCV20) 11/13/2023   DTaP/Tdap/Td (2 - Td or Tdap) 02/12/2025   INFLUENZA VACCINE  Completed   Hepatitis C Screening  Completed   Zoster Vaccines- Shingrix  Completed   HPV VACCINES  Aged Out    Health Maintenance  Health Maintenance Due  Topic Date Due   FOOT EXAM  02/19/2017   COVID-19 Vaccine (3 - Moderna risk series) 04/15/2020    Colorectal cancer screening: Type of screening: Colonoscopy. Completed 12/16/17. Repeat every 5 years  Lung Cancer Screening: (Low Dose CT Chest recommended if Age 70-80years, 30 pack-year currently smoking OR have quit w/in 15years.) does not qualify.   Lung Cancer Screening Referral: n/a   Additional Screening:  Hepatitis C Screening: does qualify; Completed 08/28/16  Vision Screening: Recommended annual ophthalmology exams for early detection of glaucoma and other  disorders of the eye. Is the patient up to date with their annual eye exam?  Yes  Who is the provider or what is the name of the office in which the patient attends annual eye exams? Dr. Jorja Loa  If pt is not established with a provider, would they like to be referred to a provider to establish care? No .   Dental Screening: Recommended annual dental exams for proper oral hygiene  Community Resource Referral / Chronic Care Management: CRR required this visit?  No   CCM required this visit?  No      Plan:     I have personally reviewed and noted the following in the patient's chart:   Medical and social history Use of alcohol, tobacco or illicit drugs  Current medications and supplements including opioid prescriptions. Patient is not currently taking opioid prescriptions. Functional ability and status Nutritional status Physical activity Advanced directives List of other physicians Hospitalizations, surgeries, and ER visits in previous 12 months Vitals Screenings to include cognitive, depression, and falls Referrals and appointments  In addition, I have reviewed and  discussed with patient certain preventive protocols, quality metrics, and best practice recommendations. A written personalized care plan for preventive services as well as general preventive health recommendations were provided to patient.     Vanetta Mulders, [place]   09/18/7670   Due to this being a virtual visit, the after visit summary with patients personalized plan was offered to patient via mail or my-chart. Patient would like to access on my-chart  Nurse Notes: No concerns

## 2022-03-26 ENCOUNTER — Telehealth: Payer: Self-pay | Admitting: Pharmacist

## 2022-03-26 NOTE — Progress Notes (Signed)
Care Management & Coordination Services Pharmacy Team  Reason for Encounter: Appointment Reminder  Contacted patient to confirm in office appointment with Leata Mouse, PharmD on 03/27/2022 at 10 am. Spoke with patient on 03/26/2022   Do you have any problems getting your medications? Yes If yes what types of problems are you experiencing? Patient states he has issues with prescriptions not being sent in on time before he runs out of medication. He also states the medications are sent in without refills.  What is your top health concern you would like to discuss at your upcoming visit? Fatty Liver  Have you seen any other providers since your last visit with PCP? No   Chart review:  Recent office visits:  01/16/2022 OV (PCP)  Elsie Stain, MD; I will start the patient on prednisone taper pack and obtain an x-ray of the lumbar spine to evaluate further.   12/18/2021 OV (PCP) Susy Frizzle, MD;  My plan is to take him off glimepiride and replace with either Trulicity or Ozempic which ever his insurance will cover to help facilitate weight loss which I think would also help address his fatty liver disease   12/11/2021 OV (PCP) Susy Frizzle, MD; Begin Keflex 400 mg 4 times daily for 1 week. We also discussed starting Ozempic 0.5 mg subcu weekly in addition to his metformin and glimepiride   Recent consult visits:  None  Hospital visits:  None in previous 6 months   Star Rating Drugs:  Trulicity [id number] [id number] mL last filled 01/12/2022 28 DS Losartan 100 mg last filled 03/15/2022 90 DS Metformin 1000 mg last filled 01/08/2022 90 DS Pioglitazone 30 mg last filled 03/20/2022 30 DS Ozempic 0.25 mg last filled 01/19/2022 28 DS   Care Gaps: Annual wellness visit in last year? Yes  If Diabetic: Last eye exam / retinopathy screening: 06/14/2021 Last diabetic foot exam: 02/20/2016  Future Appointments  Date Time Provider Day  03/27/2022 10:00 AM Edythe Clarity, [facility] None  06/21/2022  8:15 AM Susy Frizzle, MD [facility]  03/28/2023  3:00 PM [facility]SE [facility] ADVISOR [facility]   April D Calhoun, [place] Pharmacist Assistant [phone]

## 2022-03-27 ENCOUNTER — Ambulatory Visit: Payer: Medicare Other | Admitting: Pharmacist

## 2022-03-27 NOTE — Progress Notes (Unsigned)
Care Management & Coordination Services Pharmacy Note  03/28/2022 Name:  Joel Reed. MRN:  [id number] DOB:  January 18, 1951  Summary: PharmD initial visit.  Patient doing well overall, needs assistance with application for Ozempic.  Forms completed and sent in to program.  Will Fu on program in a few days.  His A1c continues to creep up - DM counseling today.  He is going to try and implement some sort of exercise to assist with lowering A1c.  Recommendations/Changes made from today's visit: No changes to meds - complete PAP  Follow up plan: FU 6 months CMA to check glucose 3 months  Subjective: Joel Reed. is an 72 y.o. year old male who is a primary patient of Pickard, Cammie Mcgee, MD.  The care coordination team was consulted for assistance with disease management and care coordination needs.    Engaged with patient face to face for initial visit.  Recent office visits:  01/16/2022 OV (PCP)  Elsie Stain, MD; I will start the patient on prednisone taper pack and obtain an x-ray of the lumbar spine to evaluate further.    12/18/2021 OV (PCP) Susy Frizzle, MD;  My plan is to take him off glimepiride and replace with either Trulicity or Ozempic which ever his insurance will cover to help facilitate weight loss which I think would also help address his fatty liver disease    12/11/2021 OV (PCP) Susy Frizzle, MD; Begin Keflex 400 mg 4 times daily for 1 week. We also discussed starting Ozempic 0.5 mg subcu weekly in addition to his metformin and glimepiride    Recent consult visits:  None   Hospital visits:  None in previous 6 months   Objective:  Lab Results  Component Value Date   CREATININE 0.72 12/18/2021   BUN 15 12/18/2021   GFR 94.23 01/25/2020   EGFR 99 12/18/2021   GFRNONAA >60 03/12/2019   GFRAA >60 03/12/2019   NA 139 12/18/2021   K 4.0 12/18/2021   CALCIUM 9.1 12/18/2021   CO2 27 12/18/2021   GLUCOSE 223 (H) 12/18/2021    Lab Results   Component Value Date/Time   HGBA1C 8.1 (H) 12/18/2021 09:12 AM   HGBA1C 7.5 (H) 07/11/2021 09:27 AM   GFR 94.23 01/25/2020 03:22 PM   GFR 93.95 11/13/2018 05:14 PM   MICROALBUR 15.7 07/11/2021 09:27 AM   MICROALBUR 3.8 03/29/2017 04:02 PM    Last diabetic Eye exam:  Lab Results  Component Value Date/Time   HMDIABEYEEXA No Retinopathy 06/14/2021 02:34 PM    Last diabetic Foot exam: No results found for: "HMDIABFOOTEX"   Lab Results  Component Value Date   CHOL 156 12/18/2021   HDL 45 12/18/2021   LDLCALC 92 12/18/2021   TRIG 92 12/18/2021   CHOLHDL 3.5 12/18/2021       Latest Ref Rng & Units 01/16/2022   10:31 AM 12/18/2021    9:12 AM 07/11/2021    9:27 AM  Hepatic Function  Total Protein 6.1 - 8.1 g/dL 7.3  7.1  7.3   AST 10 - 35 U/L 33  45  26   ALT 9 - 46 U/L 49  68  42   Total Bilirubin 0.2 - 1.2 mg/dL 1.0  0.9  0.9   Bilirubin, Direct 0.0 - 0.2 mg/dL 0.3       No results found for: "TSH", "FREET4"     Latest Ref Rng & Units 01/16/2022   10:31 AM 07/11/2021  9:27 AM 01/25/2020    3:22 PM  CBC  WBC 3.8 - 10.8 Thousand/uL  4.9  4.2   Hemoglobin 13.2 - 17.1 g/dL  14.3  14.6   Hematocrit 38.5 - 50.0 %  40.9  41.3   Platelets  CANCELED  113  117.0     Lab Results  Component Value Date/Time   VITAMINB12 845 01/21/2018 03:54 PM   VITAMINB12 299 10/10/2017 04:54 PM    Clinical ASCVD: Yes  The 10-year ASCVD risk score (Arnett DK, et al., 2019) is: 41.4%   Values used to calculate the score:     Age: 72 years     Sex: Male     Is Non-Hispanic African American: No     Diabetic: Yes     Tobacco smoker: No     Systolic Blood Pressure: 123456 mmHg     Is BP treated: Yes     HDL Cholesterol: 45 mg/dL     Total Cholesterol: 156 mg/dL        03/22/2022    3:39 PM 01/16/2022   10:08 AM 12/11/2021   10:57 AM  Depression screen PHQ 2/9  Decreased Interest 0 0 0  Down, Depressed, Hopeless 0 0 0  PHQ - 2 Score 0 0 0     Social History   Tobacco Use  Smoking  Status Never  Smokeless Tobacco Never   BP Readings from Last 3 Encounters:  01/16/22 124/62  12/18/21 126/78  12/11/21 132/68   Pulse Readings from Last 3 Encounters:  01/16/22 76  12/18/21 75  12/11/21 75   Wt Readings from Last 3 Encounters:  03/22/22 255 lb (115.7 kg)  01/16/22 255 lb 3.2 oz (115.8 kg)  12/18/21 255 lb 3.2 oz (115.8 kg)   BMI Readings from Last 3 Encounters:  03/22/22 33.64 kg/m  01/16/22 33.67 kg/m  12/18/21 33.67 kg/m    Allergies  Allergen Reactions   Gluten Meal     Medications Reviewed Today     Reviewed by Edythe Clarity, RPH (Pharmacist) on 03/28/22 at 68  Med List Status: <None>   Medication Order Taking? Sig Documenting Provider Last Dose Status Informant  amLODipine (NORVASC) 5 MG tablet [id number] No Take 1 tablet (5 mg total) by mouth daily. Susy Frizzle, MD Taking Active   cyanocobalamin (VITAMIN B12) 500 MCG tablet [id number] No Take 1 tablet (500 mcg total) by mouth daily. Susy Frizzle, MD Taking Active   fluticasone [facility]) 50 MCG/ACT nasal spray [id number] No Place 2 sprays into both nostrils daily. Susy Frizzle, MD Taking Active   Discontinued 03/28/22 1618 (Change in therapy)            Med Note (PERDUE, [place] Jan 16, 2022 10:03 AM) 01/16/2022-Pt is taking 2 mg.   glucose blood (CONTOUR NEXT TEST) test strip [id number] No USE 1 STRIP TO CHECK GLUCOSE ONCE DAILY Susy Frizzle, MD Taking Active   ketoconazole (NIZORAL) 2 % cream [id number] No Apply topically 2 (two) times daily. [provider] Taking Active   losartan (COZAAR) 100 MG tablet [id number] No Take 1 tablet (100 mg total) by mouth daily. Susy Frizzle, MD Taking Active   metFORMIN (GLUCOPHAGE) 1000 MG tablet [id number] No Take 1 tablet (1,000 mg total) by mouth 2 (two) times daily with a meal. Susy Frizzle, MD Taking Active   metoprolol succinate (TOPROL-XL) 25 MG 24 hr tablet [id number] No Take 1 tablet (25 mg total) by  mouth daily. Susy Frizzle, MD Taking Active   pioglitazone (ACTOS) 30 MG tablet [id number] No Take 1 tablet by mouth once daily Susy Frizzle, MD Taking Active   Semaglutide,0.25 or 0.5MG/DOS, (OZEMPIC, 0.25 OR 0.5 MG/DOSE,) 2 MG/3ML SOPN [id number] No Inject 0.5 mg into the skin once a week.  Patient not taking: Reported on 03/22/2022   Susy Frizzle, MD Not Taking Active             SDOH:  (Social Determinants of Health) assessments and interventions performed: Yes Financial Resource Strain: Low Risk  (03/22/2022)   Overall Financial Resource Strain (CARDIA)    Difficulty of Paying Living Expenses: Not hard at all   Food Insecurity: No Food Insecurity (03/22/2022)   Hunger Vital Sign    Worried About Running Out of Food in the Last Year: Never true    Ran Out of Food in the Last Year: Never true    SDOH Interventions    Flowsheet Row Clinical Support from 03/22/2022 in [place] Interventions   Food Insecurity Interventions Intervention Not Indicated  Housing Interventions Intervention Not Indicated  Transportation Interventions Intervention Not Indicated  Utilities Interventions Intervention Not Indicated  Alcohol Usage Interventions Intervention Not Indicated (Score <7)  Financial Strain Interventions Intervention Not Indicated  Physical Activity Interventions Intervention Not Indicated  Stress Interventions Intervention Not Indicated  Social Connections Interventions Intervention Not Indicated       Medication Assistance: None required.  Patient affirms current coverage meets needs.  Medication Access: Within the past 30 days, how often has patient missed a dose of medication? 0 Is a pillbox or other method used to improve adherence? No  Factors that may affect medication adherence? no barriers identified Are meds synced by current pharmacy? No  Are meds delivered by current pharmacy? No  Does patient experience delays in  picking up medications due to transportation concerns? No   [facility] Services Reviewed: Is patient disadvantaged to use [facility]?: No  Current Rx insurance plan: [employer] Name and location of Current pharmacy:  [facility], [place] - [address] [place] [address] [place] [address] Phone: [phone] Fax: [phone]  [facility] services reviewed with patient today?: Yes  Patient requests to transfer care to [facility]?: No  Reason patient declined to change pharmacies: Loyalty to other pharmacy/Patient preference  Compliance/Adherence/Medication fill history: [facility] Rating Drugs:  Trulicity [id number] [id number] mL last filled 01/12/2022 28 DS Losartan 100 mg last filled 03/15/2022 90 DS Metformin 1000 mg last filled 01/08/2022 90 DS Pioglitazone 30 mg last filled 03/20/2022 30 DS Ozempic 0.25 mg last filled 01/19/2022 28 DS     Care Gaps: Annual wellness visit in last year? Yes   If Diabetic: Last eye exam / retinopathy screening: 06/14/2021 Last diabetic foot exam: 02/20/2016   Assessment/Plan   Hypertension (BP goal <130/80) -Controlled, BP always excellent -Current treatment: Amlodipine 59m Appropriate, Effective, Safe, Accessible Carvedilol 6.260mBID Appropriate, Effective, Safe, Accessible Losartan 10043mppropriate, Effective, Safe, Accessible -Medications previously tried: none noted  -Current home readings: WNL, no logs today  -Denies hypotensive/hypertensive symptoms -Educated on BP goals and benefits of medications for prevention of heart attack, stroke and kidney damage; Daily salt intake goal < 2300 mg; Exercise goal of 150 minutes per week; -Counseled to monitor BP at home as current, document, and provide log at future appointments -Recommended to continue current medication  Hyperlipidemia: (LDL goal < 70) -Not ideally controlled,  most recent LDL is 92 -Current treatment: None -Medications previously tried:  statins, hesitant due to NAFL  -Educated on Cholesterol goals;  Benefits of statin for ASCVD risk reduction; ASCVD risk 41.4% - high risk -Recommended continue current management, consider statin if elevates at all.  Considering liver disease.  Diabetes (A1c goal <7%) -Uncontrolled -Current medications: Pioglitazone 17m Appropriate, Effective, Safe, Accessible Metformin 10028mAppropriate, Effective, Safe, Accessible Ozempic 0.44m22mnce weekly Appropriate, Query effective, ,  -Medications previously tried: glimepiride (recently d/c)  -Current home glucose readings  -Denies hypoglycemic/hyperglycemic symptoms -Current exercise: minimal, due to some back pain -Educated on A1c and blood sugar goals; Exercise goal of 150 minutes per week; Benefits of weight loss; Prevention and management of hypoglycemic episodes; -Counseled to check feet daily and get yearly eye exams -Recommended to continue current medication Assessed patient finances. We are in the process of getting his Ozempic approved through PAP.  This should help with some weight loss which will help glucose as well as NAFLD.  ChrBeverly MilchharmD, CPP Clinical Pharmacist Practitioner [phone]

## 2022-04-09 ENCOUNTER — Encounter: Payer: Self-pay | Admitting: Family Medicine

## 2022-04-09 ENCOUNTER — Ambulatory Visit (INDEPENDENT_AMBULATORY_CARE_PROVIDER_SITE_OTHER): Payer: Medicare Other | Admitting: Family Medicine

## 2022-04-09 VITALS — BP 124/62 | HR 69 | Temp 98.6°F | Ht 73.0 in | Wt 251.4 lb

## 2022-04-09 DIAGNOSIS — M5136 Other intervertebral disc degeneration, lumbar region: Secondary | ICD-10-CM | POA: Diagnosis not present

## 2022-04-09 DIAGNOSIS — M5431 Sciatica, right side: Secondary | ICD-10-CM

## 2022-04-09 DIAGNOSIS — K760 Fatty (change of) liver, not elsewhere classified: Secondary | ICD-10-CM | POA: Diagnosis not present

## 2022-04-09 DIAGNOSIS — M51369 Other intervertebral disc degeneration, lumbar region without mention of lumbar back pain or lower extremity pain: Secondary | ICD-10-CM

## 2022-04-09 MED ORDER — MELOXICAM 15 MG PO TABS: 15.0000 mg | ORAL_TABLET | Freq: Every day | ORAL | 0 refills | Status: DC

## 2022-04-09 NOTE — Progress Notes (Signed)
Subjective:    Patient ID: Joel Reed., male    DOB: 1950-12-10, 72 y.o.   MRN: [id number] 01/16/22 Patient reports a 2-week history of severe pain in his lower back radiating into his right posterior hip down his right lateral leg into his right foot.  He has a history of degenerative disc disease in the lumbar spine and had surgery at L4-L5 in the past per his report in 1988.  He denies any saddle anesthesias or bowel or bladder incontinence.  On his recent lab work, his AST and ALT were elevated as was his A1c.  He has yet to start Ozempic as recommended.  He is thought to have fatty liver disease.  At that time, my plan was: 'Symptoms suggest right sciatica.  I will start the patient on prednisone taper pack and obtain an x-ray of the lumbar spine to evaluate further.  Discussed the natural history that states 90% of sciatica improved within 6 weeks.  If not improving or if worsening we may need to consider an MRI.  CheckFIB-4 pertaining to his fatty liver disease due to evaluate for possible fibrosis.  Strongly encouraged 25 pounds of weight loss and start Ozempic  04/09/22 I obtained x-rays of his lumbar spine in December.  The results are dictated below: INDINGS: Lumbar alignment within normal limits. Vertebral body heights are maintained. Multilevel degenerative changes. Moderate degenerative change L2-L3 and L3-L4 with advanced degenerative change at L4-L5 and L5-S1. Facet degenerative changes of the lower lumbar spine. Patient continues to have severe pain in his lower back.  He states that he cannot bend over to put on his shoes due to the pain in his back.  The pain also radiates into his right gluteus.  He also states that he feels like his right leg is getting weaker.  He states that sometimes he feels like his right leg is going to buckle underneath him.  However he denies any numbness or tingling radiating down his right leg.  He denies any burning pain radiating down his right  leg.  He is unable to take Tylenol or NSAIDs due to his fatty liver disease.  Past Medical History:  Diagnosis Date   Allergy    seasonal   Celiac disease    Cirrhosis ([place])    Diabetes mellitus without complication ([place])    Gastritis and duodenitis    on egd 12/2017   Hyperlipidemia    Patient denies   Hypertension    IDA (iron deficiency anemia)    Internal hemorrhoids    Nephrolithiasis    PVD (peripheral vascular disease) ([place])    decreased TBI in right foot.   Right buttock pain 07/29/2018   Sleep apnea    no cpap   Splenomegaly    Tubular adenoma of colon    Past Surgical History:  Procedure Laterality Date   COLONOSCOPY     JOINT REPLACEMENT     left hip Dr Noemi Chapel   POLYPECTOMY     SPINE SURGERY     disectomy   UPPER GASTROINTESTINAL ENDOSCOPY  12/16/2017   Current Outpatient Medications on File Prior to Visit  Medication Sig Dispense Refill   amLODipine (NORVASC) 5 MG tablet Take 1 tablet (5 mg total) by mouth daily. 90 tablet 3   cyanocobalamin (VITAMIN B12) 500 MCG tablet Take 1 tablet (500 mcg total) by mouth daily. 90 tablet 3   fluticasone (FLONASE) 50 MCG/ACT nasal spray Place 2 sprays into both nostrils daily. 16 g  2   glucose blood (CONTOUR NEXT TEST) test strip USE 1 STRIP TO CHECK GLUCOSE ONCE DAILY 300 strip 3   ketoconazole (NIZORAL) 2 % cream Apply topically 2 (two) times daily.     losartan (COZAAR) 100 MG tablet Take 1 tablet (100 mg total) by mouth daily. 90 tablet 3   metFORMIN (GLUCOPHAGE) 1000 MG tablet Take 1 tablet (1,000 mg total) by mouth 2 (two) times daily with a meal. 180 tablet 3   metoprolol succinate (TOPROL-XL) 25 MG 24 hr tablet Take 1 tablet (25 mg total) by mouth daily. 90 tablet 3   pioglitazone (ACTOS) 30 MG tablet Take 1 tablet by mouth once daily 30 tablet 0   Semaglutide,0.25 or 0.'5MG'$ /DOS, (OZEMPIC, 0.25 OR 0.5 MG/DOSE,) 2 MG/3ML SOPN Inject 0.5 mg into the skin once a week. 3 mL 1   No current facility-administered  medications on file prior to visit.   Allergies  Allergen Reactions   Gluten Meal    Social History   Socioeconomic History   Marital status: Married    Spouse name: Not on file   Number of children: Not on file   Years of education: Not on file   Highest education level: Not on file  Occupational History   Not on file  Tobacco Use   Smoking status: Never   Smokeless tobacco: Never  Vaping Use   Vaping Use: Never used  Substance and Sexual Activity   Alcohol use: No    Comment: quit 1990   Drug use: No   Sexual activity: Yes  Other Topics Concern   Not on file  Social History Narrative   Not on file   Social Determinants of Health   Financial Resource Strain: Low Risk  (03/22/2022)   Overall Financial Resource Strain (CARDIA)    Difficulty of Paying Living Expenses: Not hard at all  Food Insecurity: No Food Insecurity (03/22/2022)   Hunger Vital Sign    Worried About Running Out of Food in the Last Year: Never true    [place] in the Last Year: Never true  Transportation Needs: No Transportation Needs (03/22/2022)   PRAPARE - Hydrologist (Medical): No    Lack of Transportation (Non-Medical): No  Physical Activity: Sufficiently Active (03/22/2022)   Exercise Vital Sign    Days of Exercise per Week: 5 days    Minutes of Exercise per Session: 30 min  Stress: No Stress Concern Present (03/22/2022)   [place]    Feeling of Stress : Not at all  Social Connections: [place] (03/22/2022)   Social Connection and Isolation Panel [NHANES]    Frequency of Communication with Friends and Family: More than three times a week    Frequency of Social Gatherings with Friends and Family: Three times a week    Attends Religious Services: More than 4 times per year    Active Member of Clubs or Organizations: Yes    Attends Archivist Meetings: More than 4 times per year     Marital Status: Married  Human resources officer Violence: Not At Risk (03/22/2022)   Humiliation, Afraid, Rape, and Kick questionnaire    Fear of Current or Ex-Partner: No    Emotionally Abused: No    Physically Abused: No    Sexually Abused: No   Family History  Problem Relation Age of Onset   Esophageal cancer Father    Breast cancer Sister  bone cancer   Heart attack Brother 17   Cancer - Other Sister        cervical?   Colon polyps Mother    Colon cancer Neg Hx    Rectal cancer Neg Hx    Stomach cancer Neg Hx    Father died from throat cancer  Review of Systems  Musculoskeletal:  Positive for back pain.       Objective:   Physical Exam Vitals reviewed.  Constitutional:      General: He is not in acute distress.    Appearance: He is well-developed. He is not diaphoretic.  HENT:     Head: Normocephalic and atraumatic.     Right Ear: External ear normal.     Left Ear: External ear normal.     Nose: Nose normal.     Mouth/Throat:     Pharynx: No oropharyngeal exudate.  Eyes:     General: No scleral icterus.       Right eye: No discharge.        Left eye: No discharge.     Conjunctiva/sclera: Conjunctivae normal.     Pupils: Pupils are equal, round, and reactive to light.  Neck:     Thyroid: No thyromegaly.     Vascular: No JVD.  Cardiovascular:     Rate and Rhythm: Normal rate and regular rhythm.     Heart sounds: Normal heart sounds. No murmur heard.    No friction rub. No gallop.  Pulmonary:     Effort: Pulmonary effort is normal. No respiratory distress.     Breath sounds: Normal breath sounds. No wheezing or rales.  Chest:     Chest wall: No tenderness.  Abdominal:     General: Bowel sounds are normal. There is no distension.     Palpations: Abdomen is soft. There is no mass.     Tenderness: There is no abdominal tenderness. There is no guarding or rebound.  Musculoskeletal:        General: No deformity. Normal range of motion.     Cervical back:  Neck supple.     Lumbar back: Tenderness present. No swelling, deformity, spasms or bony tenderness. Normal range of motion.       Back:  Lymphadenopathy:     Cervical: No cervical adenopathy.  Skin:    General: Skin is warm and dry.     Coloration: Skin is not pale.     Findings: No erythema or rash.  Neurological:     Mental Status: He is alert and oriented to person, place, and time.     Cranial Nerves: No cranial nerve deficit.     Motor: No abnormal muscle tone.     Coordination: Coordination normal.     Deep Tendon Reflexes: Reflexes are normal and symmetric.  Psychiatric:        Behavior: Behavior normal.        Thought Content: Thought content normal.        Judgment: Judgment normal.           Assessment & Plan:  Right sided sciatica  NAFLD (nonalcoholic fatty liver disease)  DDD (degenerative disc disease), lumbar Schedule the patient for an MRI of the spine.  I suspect that he likely pinched nerve most likely at L3-L4 based on his x-ray.  Patient unable to take Tylenol and/or NSAIDs due to his fatty liver disease.  I did give him meloxicam and explained that he can take this sparingly for severe brain but  I do not want him to take it consistently on a daily basis due to his fatty liver disease.  I think if he took it for 3 or 4 days this would not present an issue.  I do not want him to constantly take prednisone due to his diabetes.  Obtain MRI of the lumbar spine to see if the patient is a candidate for an epidural steroid injection

## 2022-04-17 ENCOUNTER — Other Ambulatory Visit: Payer: Self-pay | Admitting: Family Medicine

## 2022-04-17 DIAGNOSIS — E118 Type 2 diabetes mellitus with unspecified complications: Secondary | ICD-10-CM

## 2022-04-17 DIAGNOSIS — K7460 Unspecified cirrhosis of liver: Secondary | ICD-10-CM

## 2022-04-17 DIAGNOSIS — K760 Fatty (change of) liver, not elsewhere classified: Secondary | ICD-10-CM

## 2022-04-23 ENCOUNTER — Other Ambulatory Visit: Payer: Self-pay | Admitting: Family Medicine

## 2022-04-23 DIAGNOSIS — K760 Fatty (change of) liver, not elsewhere classified: Secondary | ICD-10-CM

## 2022-04-23 DIAGNOSIS — K7460 Unspecified cirrhosis of liver: Secondary | ICD-10-CM

## 2022-04-23 DIAGNOSIS — E118 Type 2 diabetes mellitus with unspecified complications: Secondary | ICD-10-CM

## 2022-04-30 ENCOUNTER — Ambulatory Visit
Admission: RE | Admit: 2022-04-30 | Discharge: 2022-04-30 | Disposition: A | Discharge status: Home / Self Care | Payer: Medicare Other | Source: Ambulatory Visit | Attending: Family Medicine | Admitting: Family Medicine

## 2022-04-30 DIAGNOSIS — M545 Low back pain, unspecified: Secondary | ICD-10-CM | POA: Diagnosis not present

## 2022-04-30 DIAGNOSIS — M25551 Pain in right hip: Secondary | ICD-10-CM | POA: Diagnosis not present

## 2022-04-30 DIAGNOSIS — M5431 Sciatica, right side: Secondary | ICD-10-CM

## 2022-04-30 DIAGNOSIS — M5136 Other intervertebral disc degeneration, lumbar region: Secondary | ICD-10-CM

## 2022-04-30 DIAGNOSIS — M51369 Other intervertebral disc degeneration, lumbar region without mention of lumbar back pain or lower extremity pain: Secondary | ICD-10-CM

## 2022-04-30 DIAGNOSIS — M48061 Spinal stenosis, lumbar region without neurogenic claudication: Secondary | ICD-10-CM | POA: Diagnosis not present

## 2022-05-20 ENCOUNTER — Other Ambulatory Visit: Payer: Self-pay | Admitting: Family Medicine

## 2022-05-20 DIAGNOSIS — E118 Type 2 diabetes mellitus with unspecified complications: Secondary | ICD-10-CM

## 2022-05-20 DIAGNOSIS — K7460 Unspecified cirrhosis of liver: Secondary | ICD-10-CM

## 2022-05-20 DIAGNOSIS — K760 Fatty (change of) liver, not elsewhere classified: Secondary | ICD-10-CM

## 2022-05-22 NOTE — Telephone Encounter (Signed)
Requested Prescriptions  Pending Prescriptions Disp Refills   pioglitazone (ACTOS) 30 MG tablet [Pharmacy Med Name: Pioglitazone HCl 30 MG Oral Tablet] 90 tablet 0    Sig: Take 1 tablet by mouth once daily     Endocrinology:  Diabetes - Glitazones - pioglitazone Failed - 05/20/2022  7:52 AM      Failed - HBA1C is between 0 and 7.9 and within 180 days    Hgb A1c MFr Bld  Date Value Ref Range Status  12/18/2021 8.1 (H) <5.7 % of total Hgb Final    Comment:    For someone without known diabetes, a hemoglobin A1c value of 6.5% or greater indicates that they may have  diabetes and this should be confirmed with a follow-up  test. . For someone with known diabetes, a value <7% indicates  that their diabetes is well controlled and a value  greater than or equal to 7% indicates suboptimal  control. A1c targets should be individualized based on  duration of diabetes, age, comorbid conditions, and  other considerations. . Currently, no consensus exists regarding use of hemoglobin A1c for diagnosis of diabetes for children. .          Failed - Valid encounter within last 6 months    Recent Outpatient Visits           10 months ago Controlled type 2 diabetes mellitus with complication, without long-term current use of insulin (HCC)   [facility] Pickard, Priscille Heidelberg, MD   1 year ago Screening cholesterol level   [facility] Donita Brooks, MD   3 years ago Acute maxillary sinusitis, recurrence not specified   [facility] Clifford, Velna Hatchet, MD   3 years ago Controlled type 2 diabetes mellitus with complication, without long-term current use of insulin (HCC)   [facility] Pickard, Priscille Heidelberg, MD   3 years ago Hospital discharge follow-up   [facility] Donita Brooks, MD       Future Appointments             In 1 month Pickard, Priscille Heidelberg, MD [facility], [facility]

## 2022-06-19 ENCOUNTER — Encounter: Payer: Self-pay | Admitting: Internal Medicine

## 2022-06-19 DIAGNOSIS — E113293 Type 2 diabetes mellitus with mild nonproliferative diabetic retinopathy without macular edema, bilateral: Secondary | ICD-10-CM | POA: Diagnosis not present

## 2022-06-21 ENCOUNTER — Encounter: Payer: Self-pay | Admitting: Family Medicine

## 2022-06-21 ENCOUNTER — Ambulatory Visit (INDEPENDENT_AMBULATORY_CARE_PROVIDER_SITE_OTHER): Payer: Medicare Other | Admitting: Family Medicine

## 2022-06-21 VITALS — BP 122/68 | HR 74 | Temp 98.6°F | Ht 73.0 in | Wt 252.6 lb

## 2022-06-21 DIAGNOSIS — E118 Type 2 diabetes mellitus with unspecified complications: Secondary | ICD-10-CM | POA: Diagnosis not present

## 2022-06-21 DIAGNOSIS — E785 Hyperlipidemia, unspecified: Secondary | ICD-10-CM

## 2022-06-21 DIAGNOSIS — R350 Frequency of micturition: Secondary | ICD-10-CM

## 2022-06-21 DIAGNOSIS — K760 Fatty (change of) liver, not elsewhere classified: Secondary | ICD-10-CM

## 2022-06-21 DIAGNOSIS — Z7985 Long-term (current) use of injectable non-insulin antidiabetic drugs: Secondary | ICD-10-CM | POA: Diagnosis not present

## 2022-06-21 DIAGNOSIS — Z125 Encounter for screening for malignant neoplasm of prostate: Secondary | ICD-10-CM | POA: Diagnosis not present

## 2022-06-21 DIAGNOSIS — N401 Enlarged prostate with lower urinary tract symptoms: Secondary | ICD-10-CM | POA: Diagnosis not present

## 2022-06-21 DIAGNOSIS — K7460 Unspecified cirrhosis of liver: Secondary | ICD-10-CM | POA: Diagnosis not present

## 2022-06-21 MED ORDER — SEMAGLUTIDE (1 MG/DOSE) 4 MG/3ML ~~LOC~~ SOPN: 1.0000 mg | PEN_INJECTOR | SUBCUTANEOUS | 3 refills | Status: DC

## 2022-06-21 MED ORDER — TAMSULOSIN HCL 0.4 MG PO CAPS
0.4000 mg | ORAL_CAPSULE | Freq: Every day | ORAL | 3 refills | Status: DC
Start: 2022-06-21 — End: 2022-09-19

## 2022-06-21 NOTE — Progress Notes (Signed)
Subjective:    Patient ID: Joel Reed., male    DOB: 1950/03/17, 72 y.o.   MRN: [id number]  HPI Patient is a very pleasant 73 year old Caucasian gentleman who is here today for follow-up of his chronic medical conditions.  He has a history of type 2 diabetes mellitus and nonalcoholic fatty liver disease with cirrhosis.  Last HgA1c was 8.1 in 11/23.   Wt Readings from Last 3 Encounters:  04/09/22 251 lb 6.4 oz (114 kg)  03/22/22 255 lb (115.7 kg)  01/16/22 255 lb 3.2 oz (115.8 kg)   Despite being on semaglutide, he has only lost about 8 pounds since 5/23.  He is only taking 0.5 mg weekly.  He denies any nausea or vomiting abdominal pain.  He would be willing to take a higher dose to help facilitate weight loss.  He is due for colonoscopy this year.  I recommended that he reach out to his gastro neurologist.  He is also due to check a PSA to screen for prostate cancer.  His blood pressure today is outstanding.  He continues to report increased urinary frequency.  He thinks that he is not emptying his bladder completely.  He reports a decreased stream at times.  He is reporting 2-3 episodes of nocturia each evening Past Medical History:  Diagnosis Date   Allergy    seasonal   Celiac disease    Cirrhosis (HCC)    Diabetes mellitus without complication (HCC)    Gastritis and duodenitis    on egd 12/2017   Hyperlipidemia    Patient denies   Hypertension    IDA (iron deficiency anemia)    Internal hemorrhoids    Nephrolithiasis    PVD (peripheral vascular disease) (HCC)    decreased TBI in right foot.   Right buttock pain 07/29/2018   Sleep apnea    no cpap   Splenomegaly    Tubular adenoma of colon    Past Surgical History:  Procedure Laterality Date   COLONOSCOPY     JOINT REPLACEMENT     left hip Dr Thurston Hole   POLYPECTOMY     SPINE SURGERY     disectomy   UPPER GASTROINTESTINAL ENDOSCOPY  12/16/2017   Current Outpatient Medications on File Prior to Visit  Medication Sig  Dispense Refill   amLODipine (NORVASC) 5 MG tablet Take 1 tablet (5 mg total) by mouth daily. 90 tablet 3   cyanocobalamin (VITAMIN B12) 500 MCG tablet Take 1 tablet (500 mcg total) by mouth daily. 90 tablet 3   fluticasone (FLONASE) 50 MCG/ACT nasal spray Place 2 sprays into both nostrils daily. 16 g 2   glucose blood (CONTOUR NEXT TEST) test strip USE 1 STRIP TO CHECK GLUCOSE ONCE DAILY 300 strip 3   ketoconazole (NIZORAL) 2 % cream Apply topically 2 (two) times daily.     losartan (COZAAR) 100 MG tablet Take 1 tablet (100 mg total) by mouth daily. 90 tablet 3   meloxicam (MOBIC) 15 MG tablet Take 1 tablet (15 mg total) by mouth daily. 30 tablet 0   metFORMIN (GLUCOPHAGE) 1000 MG tablet Take 1 tablet (1,000 mg total) by mouth 2 (two) times daily with a meal. 180 tablet 3   metoprolol succinate (TOPROL-XL) 25 MG 24 hr tablet Take 1 tablet (25 mg total) by mouth daily. 90 tablet 3   pioglitazone (ACTOS) 30 MG tablet Take 1 tablet by mouth once daily 90 tablet 0   Semaglutide,0.25 or 0.5MG /DOS, (OZEMPIC, 0.25 OR 0.5 MG/DOSE,)  2 MG/3ML SOPN Inject 0.5 mg into the skin once a week. 3 mL 1   No current facility-administered medications on file prior to visit.   Allergies  Allergen Reactions   Gluten Meal    Social History   Socioeconomic History   Marital status: Married    Spouse name: Not on file   Number of children: Not on file   Years of education: Not on file   Highest education level: Not on file  Occupational History   Not on file  Tobacco Use   Smoking status: Never   Smokeless tobacco: Never  Vaping Use   Vaping Use: Never used  Substance and Sexual Activity   Alcohol use: No    Comment: quit 1990   Drug use: No   Sexual activity: Yes  Other Topics Concern   Not on file  Social History Narrative   Not on file   Social Determinants of Health   Financial Resource Strain: Low Risk  (03/22/2022)   Overall Financial Resource Strain (CARDIA)    Difficulty of Paying  Living Expenses: Not hard at all  Food Insecurity: No Food Insecurity (03/22/2022)   Hunger Vital Sign    Worried About Running Out of Food in the Last Year: Never true    Ran Out of Food in the Last Year: Never true  Transportation Needs: No Transportation Needs (03/22/2022)   PRAPARE - Administrator, Civil Service (Medical): No    Lack of Transportation (Non-Medical): No  Physical Activity: Sufficiently Active (03/22/2022)   Exercise Vital Sign    Days of Exercise per Week: 5 days    Minutes of Exercise per Session: 30 min  Stress: No Stress Concern Present (03/22/2022)   [employer] of Occupational Health - Occupational Stress Questionnaire    Feeling of Stress : Not at all  Social Connections: Socially Integrated (03/22/2022)   Social Connection and Isolation Panel [NHANES]    Frequency of Communication with Friends and Family: More than three times a week    Frequency of Social Gatherings with Friends and Family: Three times a week    Attends Religious Services: More than 4 times per year    Active Member of Clubs or Organizations: Yes    Attends Banker Meetings: More than 4 times per year    Marital Status: Married  Catering manager Violence: Not At Risk (03/22/2022)   Humiliation, Afraid, Rape, and Kick questionnaire    Fear of Current or Ex-Partner: No    Emotionally Abused: No    Physically Abused: No    Sexually Abused: No   Family History  Problem Relation Age of Onset   Esophageal cancer Father    Breast cancer Sister        bone cancer   Heart attack Brother 79   Cancer - Other Sister        cervical?   Colon polyps Mother    Colon cancer Neg Hx    Rectal cancer Neg Hx    Stomach cancer Neg Hx    Father died from throat cancer  Review of Systems     Objective:   Physical Exam Vitals reviewed.  Constitutional:      General: He is not in acute distress.    Appearance: He is well-developed. He is not diaphoretic.  HENT:     Head:  Normocephalic and atraumatic.     Right Ear: External ear normal.     Left Ear: External ear  normal.     Nose: Nose normal.     Mouth/Throat:     Pharynx: No oropharyngeal exudate.  Eyes:     General: No scleral icterus.       Right eye: No discharge.        Left eye: No discharge.     Conjunctiva/sclera: Conjunctivae normal.     Pupils: Pupils are equal, round, and reactive to light.  Neck:     Thyroid: No thyromegaly.     Vascular: No JVD.  Cardiovascular:     Rate and Rhythm: Normal rate and regular rhythm.     Heart sounds: Normal heart sounds. No murmur heard.    No friction rub. No gallop.  Pulmonary:     Effort: Pulmonary effort is normal. No respiratory distress.     Breath sounds: Normal breath sounds. No wheezing or rales.  Chest:     Chest wall: No tenderness.  Abdominal:     General: Bowel sounds are normal. There is no distension.     Palpations: Abdomen is soft. There is no mass.     Tenderness: There is no abdominal tenderness. There is no guarding or rebound.  Musculoskeletal:        General: No tenderness or deformity. Normal range of motion.     Cervical back: Neck supple.  Lymphadenopathy:     Cervical: No cervical adenopathy.  Skin:    General: Skin is warm and dry.     Coloration: Skin is not pale.     Findings: No erythema or rash.  Neurological:     Mental Status: He is alert and oriented to person, place, and time.     Cranial Nerves: No cranial nerve deficit.     Motor: No abnormal muscle tone.     Coordination: Coordination normal.     Deep Tendon Reflexes: Reflexes are normal and symmetric.  Psychiatric:        Behavior: Behavior normal.        Thought Content: Thought content normal.        Judgment: Judgment normal.           Assessment & Plan:  NAFLD (nonalcoholic fatty liver disease)  Cirrhosis, non-alcoholic (HCC)  Controlled type 2 diabetes mellitus with complication, without long-term current use of insulin (HCC) - Plan: CBC  with Differential/Platelet, COMPLETE METABOLIC PANEL WITH GFR, Lipid panel, Hemoglobin A1c, Protein / Creatinine Ratio, Urine  Hyperlipidemia, unspecified hyperlipidemia type  Prostate cancer screening - Plan: PSA  Benign prostatic hyperplasia with urinary frequency - Plan: PSA His blood pressure is outstanding.  I will check an A1c today.  Ideally I like his A1c to be less than 6.5.  Increase Ozempic to 1 mg subcu weekly.  Uptitrate as tolerated to the maximum dose to achieve weight loss which I believe would be beneficial for his nonalcoholic fatty liver disease.  Try Flomax 1 mg p.o. nightly for BPH.  Screen for prostate cancer with PSA.  Recommended getting his colonoscopy this year.  Diabetic foot exam is normal

## 2022-06-22 LAB — CBC WITH DIFFERENTIAL/PLATELET
Absolute Monocytes: 388 cells/uL (ref 200–950)
Basophils Absolute: 19 cells/uL (ref 0–200)
Basophils Relative: 0.5 %
Eosinophils Absolute: 118 cells/uL (ref 15–500)
Eosinophils Relative: 3.1 %
HCT: 40.7 % (ref 38.5–50.0)
Hemoglobin: 13.9 g/dL (ref 13.2–17.1)
Lymphs Abs: 999 cells/uL (ref 850–3900)
MCH: 30.5 pg (ref 27.0–33.0)
MCHC: 34.2 g/dL (ref 32.0–36.0)
MCV: 89.5 fL (ref 80.0–100.0)
MPV: 11.5 fL (ref 7.5–12.5)
Monocytes Relative: 10.2 %
Neutro Abs: 2276 cells/uL (ref 1500–7800)
Neutrophils Relative %: 59.9 %
Platelets: 109 10*3/uL — ABNORMAL LOW (ref 140–400)
RBC: 4.55 10*6/uL (ref 4.20–5.80)
RDW: 12.6 % (ref 11.0–15.0)
Total Lymphocyte: 26.3 %
WBC: 3.8 10*3/uL (ref 3.8–10.8)

## 2022-06-22 LAB — COMPLETE METABOLIC PANEL WITH GFR
AG Ratio: 1.4 (calc) (ref 1.0–2.5)
ALT: 26 U/L (ref 9–46)
AST: 20 U/L (ref 10–35)
Albumin: 4.3 g/dL (ref 3.6–5.1)
Alkaline phosphatase (APISO): 48 U/L (ref 35–144)
BUN: 17 mg/dL (ref 7–25)
CO2: 26 mmol/L (ref 20–32)
Calcium: 9.2 mg/dL (ref 8.6–10.3)
Chloride: 103 mmol/L (ref 98–110)
Creat: 0.76 mg/dL (ref 0.70–1.35)
Globulin: 3.1 g/dL (calc) (ref 1.9–3.7)
Glucose, Bld: 132 mg/dL — ABNORMAL HIGH (ref 65–99)
Potassium: 4 mmol/L (ref 3.5–5.3)
Sodium: 140 mmol/L (ref 135–146)
Total Bilirubin: 0.7 mg/dL (ref 0.2–1.2)
Total Protein: 7.4 g/dL (ref 6.1–8.1)
eGFR: 97 mL/min/{1.73_m2} (ref >=60)

## 2022-06-22 LAB — PROTEIN / CREATININE RATIO, URINE
Creatinine, Urine: 176 mg/dL (ref 20–320)
Protein/Creat Ratio: 205 mg/g creat — ABNORMAL HIGH (ref 25–148)
Protein/Creatinine Ratio: 0.205 mg/mg creat — ABNORMAL HIGH (ref 0.025–0.148)
Total Protein, Urine: 36 mg/dL — ABNORMAL HIGH (ref 5–25)

## 2022-06-22 LAB — HEMOGLOBIN A1C
Hgb A1c MFr Bld: 6 % of total Hgb — ABNORMAL HIGH (ref <5.7)
Mean Plasma Glucose: 126 mg/dL
eAG (mmol/L): 7 mmol/L

## 2022-06-22 LAB — LIPID PANEL
Cholesterol: 161 mg/dL (ref <200)
HDL: 48 mg/dL (ref >=40)
LDL Cholesterol (Calc): 97 mg/dL (calc)
Non-HDL Cholesterol (Calc): 113 mg/dL (calc) (ref <130)
Total CHOL/HDL Ratio: 3.4 (calc) (ref <5.0)
Triglycerides: 72 mg/dL (ref <150)

## 2022-06-22 LAB — PSA: PSA: 0.16 ng/mL (ref <=4.00)

## 2022-06-25 DIAGNOSIS — Z86010 Personal history of colonic polyps: Secondary | ICD-10-CM | POA: Diagnosis not present

## 2022-06-25 DIAGNOSIS — K900 Celiac disease: Secondary | ICD-10-CM | POA: Diagnosis not present

## 2022-06-25 DIAGNOSIS — Z6833 Body mass index (BMI) 33.0-33.9, adult: Secondary | ICD-10-CM | POA: Diagnosis not present

## 2022-06-25 DIAGNOSIS — Z8711 Personal history of peptic ulcer disease: Secondary | ICD-10-CM | POA: Diagnosis not present

## 2022-06-25 DIAGNOSIS — K769 Liver disease, unspecified: Secondary | ICD-10-CM | POA: Diagnosis not present

## 2022-06-25 DIAGNOSIS — K7460 Unspecified cirrhosis of liver: Secondary | ICD-10-CM | POA: Diagnosis not present

## 2022-06-25 DIAGNOSIS — K7581 Nonalcoholic steatohepatitis (NASH): Secondary | ICD-10-CM | POA: Diagnosis not present

## 2022-06-25 DIAGNOSIS — Z79899 Other long term (current) drug therapy: Secondary | ICD-10-CM | POA: Diagnosis not present

## 2022-06-25 DIAGNOSIS — I8501 Esophageal varices with bleeding: Secondary | ICD-10-CM | POA: Diagnosis not present

## 2022-06-25 DIAGNOSIS — E669 Obesity, unspecified: Secondary | ICD-10-CM | POA: Diagnosis not present

## 2022-06-25 DIAGNOSIS — I10 Essential (primary) hypertension: Secondary | ICD-10-CM | POA: Diagnosis not present

## 2022-06-25 DIAGNOSIS — K766 Portal hypertension: Secondary | ICD-10-CM | POA: Diagnosis not present

## 2022-06-25 DIAGNOSIS — Z860101 Personal history of adenomatous and serrated colon polyps: Secondary | ICD-10-CM | POA: Insufficient documentation

## 2022-06-25 DIAGNOSIS — E119 Type 2 diabetes mellitus without complications: Secondary | ICD-10-CM | POA: Diagnosis not present

## 2022-06-25 DIAGNOSIS — K3189 Other diseases of stomach and duodenum: Secondary | ICD-10-CM | POA: Diagnosis not present

## 2022-06-25 DIAGNOSIS — K7469 Other cirrhosis of liver: Secondary | ICD-10-CM | POA: Diagnosis not present

## 2022-06-26 ENCOUNTER — Telehealth: Payer: Self-pay

## 2022-06-26 NOTE — Telephone Encounter (Signed)
Fax Rx to [phone] for pt for Semaglutid  1mg  SQ weekly

## 2022-06-27 DIAGNOSIS — D225 Melanocytic nevi of trunk: Secondary | ICD-10-CM | POA: Diagnosis not present

## 2022-06-27 DIAGNOSIS — Z1283 Encounter for screening for malignant neoplasm of skin: Secondary | ICD-10-CM | POA: Diagnosis not present

## 2022-06-27 DIAGNOSIS — X32XXXD Exposure to sunlight, subsequent encounter: Secondary | ICD-10-CM | POA: Diagnosis not present

## 2022-06-27 DIAGNOSIS — L570 Actinic keratosis: Secondary | ICD-10-CM | POA: Diagnosis not present

## 2022-06-27 DIAGNOSIS — Z85820 Personal history of malignant melanoma of skin: Secondary | ICD-10-CM | POA: Diagnosis not present

## 2022-06-27 DIAGNOSIS — Z08 Encounter for follow-up examination after completed treatment for malignant neoplasm: Secondary | ICD-10-CM | POA: Diagnosis not present

## 2022-08-19 ENCOUNTER — Other Ambulatory Visit: Payer: Self-pay | Admitting: Family Medicine

## 2022-08-19 DIAGNOSIS — K760 Fatty (change of) liver, not elsewhere classified: Secondary | ICD-10-CM

## 2022-08-19 DIAGNOSIS — E118 Type 2 diabetes mellitus with unspecified complications: Secondary | ICD-10-CM

## 2022-08-19 DIAGNOSIS — K7460 Unspecified cirrhosis of liver: Secondary | ICD-10-CM

## 2022-08-20 NOTE — Telephone Encounter (Signed)
Requested Prescriptions  Pending Prescriptions Disp Refills   pioglitazone (ACTOS) 30 MG tablet [Pharmacy Med Name: Pioglitazone HCl 30 MG Oral Tablet] 90 tablet 0    Sig: Take 1 tablet by mouth once daily     Endocrinology:  Diabetes - Glitazones - pioglitazone Failed - 08/19/2022  7:52 AM      Failed - Valid encounter within last 6 months    Recent Outpatient Visits           1 year ago Controlled type 2 diabetes mellitus with complication, without long-term current use of insulin (HCC)   [facility] [place], Priscille Heidelberg, MD   2 years ago Screening cholesterol level   [facility] [place], Priscille Heidelberg, MD   3 years ago Acute maxillary sinusitis, recurrence not specified   [facility] [place], Velna Hatchet, MD   3 years ago Controlled type 2 diabetes mellitus with complication, without long-term current use of insulin (HCC)   [facility] [place], Priscille Heidelberg, MD   4 years ago Hospital discharge follow-up   [facility] [place], Priscille Heidelberg, MD              Passed - HBA1C is between 0 and 7.9 and within 180 days    Hgb A1c MFr Bld  Date Value Ref Range Status  06/21/2022 6.0 (H) <5.7 % of total Hgb Final    Comment:    For someone without known diabetes, a hemoglobin  A1c value between 5.7% and 6.4% is consistent with prediabetes and should be confirmed with a  follow-up test. . For someone with known diabetes, a value <7% indicates that their diabetes is well controlled. A1c targets should be individualized based on duration of diabetes, age, comorbid conditions, and other considerations. . This assay result is consistent with an increased risk of diabetes. . Currently, no consensus exists regarding use of hemoglobin A1c for diagnosis of diabetes for children. Marland Kitchen

## 2022-09-12 ENCOUNTER — Telehealth: Payer: Self-pay | Admitting: Family Medicine

## 2022-09-12 NOTE — Telephone Encounter (Signed)
Patient called to follow up on script for Semaglutide, 1 MG/DOSE, 4 MG/3ML SOPN (OZEMPIC)  Stated recent refill was for .5MG  and he has to take 2 shots instead of one. The pen he currently has will only dispense .25MG  and .50MG   Requesting for script to be adjusted to 1MG  as requested when previous refill requested.  Patient is on the Customer Assistance program, and script is shipped here directly from the manufacturer.  Please advise at [phone].

## 2022-09-19 ENCOUNTER — Other Ambulatory Visit: Payer: Self-pay | Admitting: Family Medicine

## 2022-09-19 NOTE — Telephone Encounter (Signed)
Prescription Request  09/19/2022  LOV: 06/21/2022  What is the name of the medication or equipment? tamsulosin (FLOMAX) 0.4 MG CAPS capsul   Have you contacted your pharmacy to request a refill? Yes   Which pharmacy would you like this sent to?  [facility] [address], [place] - [address] [place] [address] Phone: [phone] Fax: [phone]    Patient notified that their request is being sent to the clinical staff for review and that they should receive a response within 2 business days.   Please advise at [facility] [phone]

## 2022-09-20 MED ORDER — TAMSULOSIN HCL 0.4 MG PO CAPS: 0.4000 mg | ORAL_CAPSULE | Freq: Every day | ORAL | 3 refills | Status: AC

## 2022-09-20 NOTE — Telephone Encounter (Signed)
Requested Prescriptions  Pending Prescriptions Disp Refills   tamsulosin (FLOMAX) 0.4 MG CAPS capsule 30 capsule 3    Sig: Take 1 capsule (0.4 mg total) by mouth daily.     Urology: Alpha-Adrenergic Blocker Failed - 09/20/2022 12:23 PM      Failed - Valid encounter within last 12 months    Recent Outpatient Visits           1 year ago Controlled type 2 diabetes mellitus with complication, without long-term current use of insulin (HCC)   [facility] [place], Priscille Heidelberg, MD   2 years ago Screening cholesterol level   [facility] Donita Brooks, MD   3 years ago Acute maxillary sinusitis, recurrence not specified   [facility] [place], Velna Hatchet, MD   4 years ago Controlled type 2 diabetes mellitus with complication, without long-term current use of insulin (HCC)   [facility] [place], Priscille Heidelberg, MD   4 years ago Hospital discharge follow-up   [facility] Donita Brooks, MD              Passed - PSA in normal range and within 360 days    PSA  Date Value Ref Range Status  06/21/2022 0.16 < OR = 4.00 ng/mL Final    Comment:    The total PSA value from this assay system is  standardized against the WHO standard. The test  result will be approximately 20% lower when compared  to the equimolar-standardized total PSA (Beckman  Coulter). Comparison of serial PSA results should be  interpreted with this fact in mind. . This test was performed using the Siemens  chemiluminescent method. Values obtained from  different assay methods cannot be used interchangeably. PSA levels, regardless of value, should not be interpreted as absolute evidence of the presence or absence of disease.          Passed - Last BP in normal range    BP Readings from Last 1 Encounters:  06/21/22 122/68

## 2022-10-01 ENCOUNTER — Encounter: Payer: Medicare Other | Admitting: Pharmacist

## 2022-10-04 DIAGNOSIS — Z85820 Personal history of malignant melanoma of skin: Secondary | ICD-10-CM | POA: Diagnosis not present

## 2022-10-04 DIAGNOSIS — L570 Actinic keratosis: Secondary | ICD-10-CM | POA: Diagnosis not present

## 2022-10-04 DIAGNOSIS — Z1283 Encounter for screening for malignant neoplasm of skin: Secondary | ICD-10-CM | POA: Diagnosis not present

## 2022-10-04 DIAGNOSIS — Z08 Encounter for follow-up examination after completed treatment for malignant neoplasm: Secondary | ICD-10-CM | POA: Diagnosis not present

## 2022-10-04 DIAGNOSIS — X32XXXD Exposure to sunlight, subsequent encounter: Secondary | ICD-10-CM | POA: Diagnosis not present

## 2022-10-04 DIAGNOSIS — I872 Venous insufficiency (chronic) (peripheral): Secondary | ICD-10-CM | POA: Diagnosis not present

## 2022-11-14 ENCOUNTER — Other Ambulatory Visit: Payer: Self-pay | Admitting: Family Medicine

## 2022-11-14 DIAGNOSIS — K7460 Unspecified cirrhosis of liver: Secondary | ICD-10-CM

## 2022-11-14 DIAGNOSIS — K760 Fatty (change of) liver, not elsewhere classified: Secondary | ICD-10-CM

## 2022-11-14 DIAGNOSIS — E118 Type 2 diabetes mellitus with unspecified complications: Secondary | ICD-10-CM

## 2022-11-14 NOTE — Telephone Encounter (Signed)
Requested Prescriptions  Pending Prescriptions Disp Refills   pioglitazone (ACTOS) 30 MG tablet [Pharmacy Med Name: Pioglitazone HCl 30 MG Oral Tablet] 90 tablet 0    Sig: Take 1 tablet by mouth once daily     Endocrinology:  Diabetes - Glitazones - pioglitazone Failed - 11/14/2022  6:52 AM      Failed - Valid encounter within last 6 months    Recent Outpatient Visits           1 year ago Controlled type 2 diabetes mellitus with complication, without long-term current use of insulin (HCC)   [facility] [place], Priscille Heidelberg, MD   2 years ago Screening cholesterol level   [facility] Pickard, Priscille Heidelberg, MD   3 years ago Acute maxillary sinusitis, recurrence not specified   [facility] [place], Velna Hatchet, MD   4 years ago Controlled type 2 diabetes mellitus with complication, without long-term current use of insulin (HCC)   [facility] [place], Priscille Heidelberg, MD   4 years ago Hospital discharge follow-up   [facility] Donita Brooks, MD       Future Appointments             In 1 month Pickard, Priscille Heidelberg, MD [facility]            Passed - HBA1C is between 0 and 7.9 and within 180 days    Hgb A1c MFr Bld  Date Value Ref Range Status  06/21/2022 6.0 (H) <5.7 % of total Hgb Final    Comment:    For someone without known diabetes, a hemoglobin  A1c value between 5.7% and 6.4% is consistent with prediabetes and should be confirmed with a  follow-up test. . For someone with known diabetes, a value <7% indicates that their diabetes is well controlled. A1c targets should be individualized based on duration of diabetes, age, comorbid conditions, and other considerations. . This assay result is consistent with an increased risk of diabetes. . Currently, no consensus exists regarding use of hemoglobin A1c for diagnosis of diabetes for children. Marland Kitchen

## 2022-11-19 ENCOUNTER — Other Ambulatory Visit: Payer: Self-pay | Admitting: Family Medicine

## 2022-11-19 DIAGNOSIS — K760 Fatty (change of) liver, not elsewhere classified: Secondary | ICD-10-CM

## 2022-11-19 DIAGNOSIS — K7460 Unspecified cirrhosis of liver: Secondary | ICD-10-CM

## 2022-11-19 DIAGNOSIS — E118 Type 2 diabetes mellitus with unspecified complications: Secondary | ICD-10-CM

## 2022-11-20 NOTE — Telephone Encounter (Signed)
Due to a system glitch the last office visit for this practice is not detected correctly.    LOV 06/21/2022.    Labs in date.  Requested Prescriptions  Pending Prescriptions Disp Refills   pioglitazone (ACTOS) 30 MG tablet [Pharmacy Med Name: Pioglitazone HCl 30 MG Oral Tablet] 90 tablet 0    Sig: Take 1 tablet by mouth once daily     Endocrinology:  Diabetes - Glitazones - pioglitazone Failed - 11/19/2022 11:03 AM      Failed - Valid encounter within last 6 months    Recent Outpatient Visits           1 year ago Controlled type 2 diabetes mellitus with complication, without long-term current use of insulin (HCC)   [facility] Pickard, Priscille Heidelberg, MD   2 years ago Screening cholesterol level   [facility] Pickard, Priscille Heidelberg, MD   3 years ago Acute maxillary sinusitis, recurrence not specified   [facility] [place], Velna Hatchet, MD   4 years ago Controlled type 2 diabetes mellitus with complication, without long-term current use of insulin (HCC)   [facility] [place]ard, Priscille Heidelberg, MD   4 years ago Hospital discharge follow-up   [facility] Donita Brooks, MD       Future Appointments             In 1 month Pickard, Priscille Heidelberg, MD [place] [facility]            Passed - HBA1C is between 0 and 7.9 and within 180 days    Hgb A1c MFr Bld  Date Value Ref Range Status  06/21/2022 6.0 (H) <5.7 % of total Hgb Final    Comment:    For someone without known diabetes, a hemoglobin  A1c value between 5.7% and 6.4% is consistent with prediabetes and should be confirmed with a  follow-up test. . For someone with known diabetes, a value <7% indicates that their diabetes is well controlled. A1c targets should be individualized based on duration of diabetes, age, comorbid conditions, and other considerations. . This assay result is consistent with an increased risk of  diabetes. . Currently, no consensus exists regarding use of hemoglobin A1c for diagnosis of diabetes for children. Marland Kitchen

## 2022-12-24 ENCOUNTER — Other Ambulatory Visit: Payer: Self-pay

## 2022-12-24 ENCOUNTER — Ambulatory Visit (INDEPENDENT_AMBULATORY_CARE_PROVIDER_SITE_OTHER): Payer: Medicare Other | Admitting: Family Medicine

## 2022-12-24 ENCOUNTER — Telehealth: Payer: Self-pay

## 2022-12-24 VITALS — BP 136/82 | HR 78 | Temp 98.0°F | Ht 73.0 in | Wt 246.2 lb

## 2022-12-24 DIAGNOSIS — Z23 Encounter for immunization: Secondary | ICD-10-CM

## 2022-12-24 DIAGNOSIS — K7460 Unspecified cirrhosis of liver: Secondary | ICD-10-CM | POA: Diagnosis not present

## 2022-12-24 DIAGNOSIS — K760 Fatty (change of) liver, not elsewhere classified: Secondary | ICD-10-CM

## 2022-12-24 DIAGNOSIS — E118 Type 2 diabetes mellitus with unspecified complications: Secondary | ICD-10-CM

## 2022-12-24 NOTE — Telephone Encounter (Signed)
Joel Reed, [name] is in need of patient assistance with his Semaglutide 1 mg. Do I need to enter a referral order for him? Thank you! Germaine Pomfret

## 2022-12-24 NOTE — Progress Notes (Signed)
Subjective:    Patient ID: Joel Delaine., male    DOB: 05-19-1950, 72 y.o.   MRN: [id number]  HPI Patient is a very pleasant 72 year old Caucasian gentleman who is here today for follow-up of his chronic medical conditions.  He has a history of type 2 diabetes mellitus and nonalcoholic fatty liver disease with cirrhosis.   Wt Readings from Last 3 Encounters:  12/24/22 246 lb 3.2 oz (111.7 kg)  06/21/22 252 lb 9.6 oz (114.6 kg)  04/09/22 251 lb 6.4 oz (114 kg)  Patient has a difficult time getting his Ozempic from the patient assistance program for more than a month.  He states that he just recently started 1 mg subcu weekly.  He states that his fasting blood sugars have recently been less than 100.  He has lost 6 additional pounds since I saw him in May.  He is due for his flu shot.  Diabetic foot exam today is significant for very difficult to palpate dorsalis pedis pulses bilaterally.  The patient has normal sensation tan-brown with normal.  He denies any neuropathy although he does have diminished sensation in his right foot. Past Medical History:  Diagnosis Date   Allergy    seasonal   Celiac disease    Cirrhosis (HCC)    Diabetes mellitus without complication (HCC)    Gastritis and duodenitis    on egd 12/2017   Hyperlipidemia    Patient denies   Hypertension    IDA (iron deficiency anemia)    Internal hemorrhoids    Nephrolithiasis    PVD (peripheral vascular disease) (HCC)    decreased TBI in right foot.   Right buttock pain 07/29/2018   Sleep apnea    no cpap   Splenomegaly    Tubular adenoma of colon    Past Surgical History:  Procedure Laterality Date   COLONOSCOPY     JOINT REPLACEMENT     left hip Dr Thurston [facility]   POLYPECTOMY     SPINE SURGERY     disectomy   UPPER GASTROINTESTINAL ENDOSCOPY  12/16/2017   Current Outpatient Medications on File Prior to Visit  Medication Sig Dispense Refill   amLODipine (NORVASC) 5 MG tablet Take 1 tablet (5 mg total) by mouth  daily. 90 tablet 3   cyanocobalamin (VITAMIN B12) 500 MCG tablet Take 1 tablet (500 mcg total) by mouth daily. 90 tablet 3   fluticasone (FLONASE) 50 MCG/ACT nasal spray Place 2 sprays into both nostrils daily. 16 g 2   glucose blood (CONTOUR NEXT TEST) test strip USE 1 STRIP TO CHECK GLUCOSE ONCE DAILY 300 strip 3   ketoconazole (NIZORAL) 2 % cream Apply topically 2 (two) times daily.     losartan (COZAAR) 100 MG tablet Take 1 tablet (100 mg total) by mouth daily. 90 tablet 3   metFORMIN (GLUCOPHAGE) 1000 MG tablet Take 1 tablet (1,000 mg total) by mouth 2 (two) times daily with a meal. 180 tablet 3   metoprolol succinate (TOPROL-XL) 25 MG 24 hr tablet Take 1 tablet (25 mg total) by mouth daily. 90 tablet 3   pioglitazone (ACTOS) 30 MG tablet Take 1 tablet by mouth once daily 90 tablet 0   Semaglutide, 1 MG/DOSE, 4 MG/3ML SOPN Inject 1 mg as directed once a week. 3 mL 3   tamsulosin (FLOMAX) 0.4 MG CAPS capsule Take 1 capsule (0.4 mg total) by mouth daily. (Patient not taking: Reported on 12/24/2022) 30 capsule 3   No current facility-administered medications on  file prior to visit.   Allergies  Allergen Reactions   Gluten Meal    Social History   Socioeconomic History   Marital status: Married    Spouse name: Not on file   Number of children: Not on file   Years of education: Not on file   Highest education level: Not on file  Occupational History   Not on file  Tobacco Use   Smoking status: Never   Smokeless tobacco: Never  Vaping Use   Vaping status: Never Used  Substance and Sexual Activity   Alcohol use: No    Comment: quit 1990   Drug use: No   Sexual activity: Yes  Other Topics Concern   Not on file  Social History Narrative   Not on file   Social Determinants of Health   Financial Resource Strain: Low Risk  (03/22/2022)   Overall Financial Resource Strain (CARDIA)    Difficulty of Paying Living Expenses: Not hard at all  Food Insecurity: No Food Insecurity  (03/22/2022)   Hunger Vital Sign    Worried About Running Out of Food in the Last Year: Never true    Ran Out of Food in the Last Year: Never true  Transportation Needs: No Transportation Needs (03/22/2022)   PRAPARE - Administrator, Civil Service (Medical): No    Lack of Transportation (Non-Medical): No  Physical Activity: Sufficiently Active (03/22/2022)   Exercise Vital Sign    Days of Exercise per Week: 5 days    Minutes of Exercise per Session: 30 min  Stress: No Stress Concern Present (03/22/2022)   [employer] of Occupational Health - Occupational Stress Questionnaire    Feeling of Stress : Not at all  Social Connections: Socially Integrated (03/22/2022)   Social Connection and Isolation Panel [NHANES]    Frequency of Communication with Friends and Family: More than three times a week    Frequency of Social Gatherings with Friends and Family: Three times a week    Attends Religious Services: More than 4 times per year    Active Member of Clubs or Organizations: Yes    Attends Banker Meetings: More than 4 times per year    Marital Status: Married  Catering manager Violence: Not At Risk (03/22/2022)   Humiliation, Afraid, Rape, and Kick questionnaire    Fear of Current or Ex-Partner: No    Emotionally Abused: No    Physically Abused: No    Sexually Abused: No   Family History  Problem Relation Age of Onset   Esophageal cancer Father    Breast cancer Sister        bone cancer   Heart attack Brother 4   Cancer - Other Sister        cervical?   Colon polyps Mother    Colon cancer Neg Hx    Rectal cancer Neg Hx    Stomach cancer Neg Hx    Father died from throat cancer  Review of Systems     Objective:   Physical Exam Vitals reviewed.  Constitutional:      General: He is not in acute distress.    Appearance: He is well-developed. He is not diaphoretic.  HENT:     Head: Normocephalic and atraumatic.     Right Ear: External ear normal.      Left Ear: External ear normal.     Nose: Nose normal.     Mouth/Throat:     Pharynx: No oropharyngeal exudate.  Eyes:     General: No scleral icterus.       Right eye: No discharge.        Left eye: No discharge.     Conjunctiva/sclera: Conjunctivae normal.     Pupils: Pupils are equal, round, and reactive to light.  Neck:     Thyroid: No thyromegaly.     Vascular: No JVD.  Cardiovascular:     Rate and Rhythm: Normal rate and regular rhythm.     Heart sounds: Normal heart sounds. No murmur heard.    No friction rub. No gallop.  Pulmonary:     Effort: Pulmonary effort is normal. No respiratory distress.     Breath sounds: Normal breath sounds. No wheezing or rales.  Chest:     Chest wall: No tenderness.  Abdominal:     General: Bowel sounds are normal. There is no distension.     Palpations: Abdomen is soft. There is no mass.     Tenderness: There is no abdominal tenderness. There is no guarding or rebound.  Musculoskeletal:        General: No tenderness or deformity. Normal range of motion.     Cervical back: Neck supple.  Lymphadenopathy:     Cervical: No cervical adenopathy.  Skin:    General: Skin is warm and dry.     Coloration: Skin is not pale.     Findings: No erythema or rash.  Neurological:     Mental Status: He is alert and oriented to person, place, and time.     Cranial Nerves: No cranial nerve deficit.     Motor: No abnormal muscle tone.     Coordination: Coordination normal.     Deep Tendon Reflexes: Reflexes are normal and symmetric.  Psychiatric:        Behavior: Behavior normal.        Thought Content: Thought content normal.        Judgment: Judgment normal.           Assessment & Plan:  NAFLD (nonalcoholic fatty liver disease)  Cirrhosis, non-alcoholic (HCC)  Controlled type 2 diabetes mellitus with complication, without long-term current use of insulin (HCC) - Plan: CBC with Differential/Platelet, COMPLETE METABOLIC PANEL WITH GFR, Lipid  panel, Hemoglobin A1c, Microalbumin/Creatinine Ratio, Urine, VAS [place] ABI WITH/WO TBI  Flu vaccine need - Plan: Flu Vaccine Trivalent High Dose (Fluad) Blood pressure today is excellent.  I would like the patient to continue to lose weight.  I would recommend increasing Ozempic to 2 mg weekly if he tolerates the 1 mg dose successfully.  If his A1c is 6.0 like it was at his last visit, I plan to discontinue Actos to help with swelling and potentially weight loss.  Schedule the patient for arterial Dopplers to evaluate for peripheral vascular disease given the diminished pulses in both feet.  The patient received his flu shot today.  Diabetic eye exam was performed today.

## 2022-12-25 ENCOUNTER — Telehealth: Payer: Self-pay

## 2022-12-25 DIAGNOSIS — K7581 Nonalcoholic steatohepatitis (NASH): Secondary | ICD-10-CM | POA: Diagnosis not present

## 2022-12-25 DIAGNOSIS — Z860101 Personal history of adenomatous and serrated colon polyps: Secondary | ICD-10-CM | POA: Diagnosis not present

## 2022-12-25 DIAGNOSIS — K7689 Other specified diseases of liver: Secondary | ICD-10-CM | POA: Diagnosis not present

## 2022-12-25 DIAGNOSIS — K766 Portal hypertension: Secondary | ICD-10-CM | POA: Diagnosis not present

## 2022-12-25 DIAGNOSIS — Z79899 Other long term (current) drug therapy: Secondary | ICD-10-CM | POA: Diagnosis not present

## 2022-12-25 DIAGNOSIS — K7469 Other cirrhosis of liver: Secondary | ICD-10-CM | POA: Diagnosis not present

## 2022-12-25 LAB — COMPLETE METABOLIC PANEL WITH GFR
AG Ratio: 1.4 (calc) (ref 1.0–2.5)
ALT: 27 U/L (ref 9–46)
AST: 22 U/L (ref 10–35)
Albumin: 4.4 g/dL (ref 3.6–5.1)
Alkaline phosphatase (APISO): 49 U/L (ref 35–144)
BUN: 17 mg/dL (ref 7–25)
CO2: 30 mmol/L (ref 20–32)
Calcium: 9.3 mg/dL (ref 8.6–10.3)
Chloride: 103 mmol/L (ref 98–110)
Creat: 0.8 mg/dL (ref 0.70–1.28)
Globulin: 3.1 g/dL (ref 1.9–3.7)
Glucose, Bld: 114 mg/dL — ABNORMAL HIGH (ref 65–99)
Potassium: 4.2 mmol/L (ref 3.5–5.3)
Sodium: 140 mmol/L (ref 135–146)
Total Bilirubin: 0.7 mg/dL (ref 0.2–1.2)
Total Protein: 7.5 g/dL (ref 6.1–8.1)
eGFR: 95 mL/min/{1.73_m2} (ref >=60)

## 2022-12-25 LAB — CBC WITH DIFFERENTIAL/PLATELET
Absolute Lymphocytes: 1086 {cells}/uL (ref 850–3900)
Absolute Monocytes: 400 {cells}/uL (ref 200–950)
Basophils Absolute: 18 {cells}/uL (ref 0–200)
Basophils Relative: 0.4 %
Eosinophils Absolute: 120 {cells}/uL (ref 15–500)
Eosinophils Relative: 2.6 %
HCT: 41 % (ref 38.5–50.0)
Hemoglobin: 13.7 g/dL (ref 13.2–17.1)
MCH: 30.5 pg (ref 27.0–33.0)
MCHC: 33.4 g/dL (ref 32.0–36.0)
MCV: 91.3 fL (ref 80.0–100.0)
MPV: 11.6 fL (ref 7.5–12.5)
Monocytes Relative: 8.7 %
Neutro Abs: 2976 {cells}/uL (ref 1500–7800)
Neutrophils Relative %: 64.7 %
Platelets: 117 10*3/uL — ABNORMAL LOW (ref 140–400)
RBC: 4.49 10*6/uL (ref 4.20–5.80)
RDW: 12.8 % (ref 11.0–15.0)
Total Lymphocyte: 23.6 %
WBC: 4.6 10*3/uL (ref 3.8–10.8)

## 2022-12-25 LAB — LIPID PANEL
Cholesterol: 172 mg/dL (ref <200)
HDL: 57 mg/dL (ref >=40)
LDL Cholesterol (Calc): 97 mg/dL
Non-HDL Cholesterol (Calc): 115 mg/dL (ref <130)
Total CHOL/HDL Ratio: 3 (calc) (ref <5.0)
Triglycerides: 89 mg/dL (ref <150)

## 2022-12-25 LAB — HEMOGLOBIN A1C
Hgb A1c MFr Bld: 5.4 %{Hb} (ref <5.7)
Mean Plasma Glucose: 108 mg/dL
eAG (mmol/L): 6 mmol/L

## 2022-12-25 LAB — MICROALBUMIN / CREATININE URINE RATIO
Creatinine, Urine: 103 mg/dL (ref 20–320)
Microalb Creat Ratio: 184 mg/g{creat} — ABNORMAL HIGH (ref <30)
Microalb, Ur: 19 mg/dL

## 2022-12-25 NOTE — Progress Notes (Signed)
   Care Guide Note  12/25/2022 Name: Cotey Ham. MRN: [id number] DOB: 03-17-1950  Referred by: Donita Brooks, MD Reason for referral : Care Coordination (Outreach to schedule with Pharm d )   Noah Delaine. is a 72 y.o. year old male who is a primary care patient of Pickard, Priscille Heidelberg, MD. Noah Delaine. was referred to the pharmacist for assistance related to DM.    Successful contact was made with the patient to discuss pharmacy services including being ready for the pharmacist to call at least 5 minutes before the scheduled appointment time, to have medication bottles and any blood sugar or blood pressure readings ready for review. The patient agreed to meet with the pharmacist via with the pharmacist via telephone visit on (date/time).  01/02/2023  Penne Lash, RMA Care Guide [facility]  [place], [place] [address] Direct Dial: [phone] [email or web address]

## 2023-01-02 ENCOUNTER — Telehealth: Payer: Self-pay | Admitting: Pharmacist

## 2023-01-02 ENCOUNTER — Other Ambulatory Visit: Payer: Self-pay | Admitting: Pharmacist

## 2023-01-03 ENCOUNTER — Encounter: Payer: Self-pay | Admitting: Internal Medicine

## 2023-01-03 DIAGNOSIS — Z08 Encounter for follow-up examination after completed treatment for malignant neoplasm: Secondary | ICD-10-CM | POA: Diagnosis not present

## 2023-01-03 DIAGNOSIS — Z85820 Personal history of malignant melanoma of skin: Secondary | ICD-10-CM | POA: Diagnosis not present

## 2023-01-03 DIAGNOSIS — D485 Neoplasm of uncertain behavior of skin: Secondary | ICD-10-CM | POA: Diagnosis not present

## 2023-01-03 DIAGNOSIS — D225 Melanocytic nevi of trunk: Secondary | ICD-10-CM | POA: Diagnosis not present

## 2023-01-03 DIAGNOSIS — Z1283 Encounter for screening for malignant neoplasm of skin: Secondary | ICD-10-CM | POA: Diagnosis not present

## 2023-01-11 NOTE — Telephone Encounter (Signed)
Application submitted online 01/02/23 by Vanice Sarah  Application uploaded to media.

## 2023-01-15 ENCOUNTER — Encounter: Payer: Self-pay | Admitting: Vascular Surgery

## 2023-01-15 ENCOUNTER — Ambulatory Visit (INDEPENDENT_AMBULATORY_CARE_PROVIDER_SITE_OTHER): Payer: Medicare Other

## 2023-01-15 ENCOUNTER — Ambulatory Visit (INDEPENDENT_AMBULATORY_CARE_PROVIDER_SITE_OTHER): Payer: Medicare Other | Admitting: Vascular Surgery

## 2023-01-15 ENCOUNTER — Encounter: Payer: Self-pay | Admitting: Pharmacist

## 2023-01-15 VITALS — BP 159/74 | HR 75 | Ht 73.0 in | Wt 248.8 lb

## 2023-01-15 DIAGNOSIS — I10 Essential (primary) hypertension: Secondary | ICD-10-CM

## 2023-01-15 DIAGNOSIS — E118 Type 2 diabetes mellitus with unspecified complications: Secondary | ICD-10-CM

## 2023-01-15 DIAGNOSIS — I739 Peripheral vascular disease, unspecified: Secondary | ICD-10-CM | POA: Diagnosis not present

## 2023-01-15 LAB — VAS US ABI WITH/WO TBI
Left ABI: 1.18
Right ABI: 1.09

## 2023-01-15 NOTE — Telephone Encounter (Signed)
   Patient enrolled in the [employer] patient assistance program for Ozempic.  Re-enrolled patient online for 2025  Patient is stable on current regimen.    Kieth Brightly, PharmD, BCACP, CPP Clinical Pharmacist, [facility]

## 2023-01-15 NOTE — Progress Notes (Signed)
01/02/2023 Name: Joel Reed. MRN: [id number] DOB: February 29, 1950  Chief Complaint  Patient presents with   Diabetes    Joel Reed. is a 72 y.o. year old male who presented for a telephone visit.   They were referred to the pharmacist by their PCP for assistance in managing medication access.  Patient is currently enrolled in the Novo Nordisk PAP for Ozempic.  He is doing well on current regimen of Ozempic and metformin for his type 2 diabetes.   Subjective:  Care Team: Primary Care Provider: Donita Brooks, MD  Medication Access/Adherence  Current Pharmacy:  [facility] [address], [place] - [address] [place] [address] [place] [address] [place] [address] Phone: [phone] Fax: [phone]   Patient reports affordability concerns with their medications: Yes  Patient reports access/transportation concerns to their pharmacy: No  Patient reports adherence concerns with their medications:  No     Diabetes:  Current medications: Ozempic, metformin Medications tried in the past: glimepiride, trulicity, pioglitazone  Current glucose readings: FBG<130 Using contour meter  Current medication access support: novo nordisk PAP   Objective:  Lab Results  Component Value Date   HGBA1C 5.4 12/24/2022    Lab Results  Component Value Date   CREATININE 0.80 12/24/2022   BUN 17 12/24/2022   NA 140 12/24/2022   K 4.2 12/24/2022   CL 103 12/24/2022   CO2 30 12/24/2022    Lab Results  Component Value Date   CHOL 172 12/24/2022   HDL 57 12/24/2022   LDLCALC 97 12/24/2022   TRIG 89 12/24/2022   CHOLHDL 3.0 12/24/2022    Medications Reviewed Today     Reviewed by Danella Maiers, [facility] (Pharmacist) on 01/02/23 at 1310  Med List Status: <None>   Medication Order Taking? Sig Documenting Provider Last Dose Status Informant  amLODipine (NORVASC) 5 MG tablet [id number] No Take 1 tablet (5 mg total) by mouth daily. Donita Brooks, MD Taking Active    cyanocobalamin (VITAMIN B12) 500 MCG tablet [id number] No Take 1 tablet (500 mcg total) by mouth daily. Donita Brooks, MD Taking Active   fluticasone [facility]) 50 MCG/ACT nasal spray [id number] No Place 2 sprays into both nostrils daily. Donita Brooks, MD Taking Active   glucose blood (CONTOUR NEXT TEST) test strip [id number] No USE 1 STRIP TO CHECK GLUCOSE ONCE DAILY Donita Brooks, MD Taking Active   ketoconazole (NIZORAL) 2 % cream [id number] No Apply topically 2 (two) times daily. [provider] Taking Active   losartan (COZAAR) 100 MG tablet [id number] No Take 1 tablet (100 mg total) by mouth daily. Donita Brooks, MD Taking Active   metFORMIN (GLUCOPHAGE) 1000 MG tablet [id number] No Take 1 tablet (1,000 mg total) by mouth 2 (two) times daily with a meal. Donita Brooks, MD Taking Active   metoprolol succinate (TOPROL-XL) 25 MG 24 hr tablet [id number] No Take 1 tablet (25 mg total) by mouth daily. Donita Brooks, MD Taking Active   pioglitazone (ACTOS) 30 MG tablet [id number] No Take 1 tablet by mouth once daily Donita Brooks, MD Taking Active   Semaglutide, 1 MG/DOSE, 4 MG/3ML SOPN [id number] No Inject 1 mg as directed once a week. Donita Brooks, MD Taking Active   tamsulosin [facility]) 0.4 MG CAPS capsule [id number] No Take 1 capsule (0.4 mg total) by mouth daily.  Patient not taking: Reported on 12/24/2022   Donita Brooks, MD Not Taking Active  Assessment/Plan:   Diabetes: - Currently controlled - Reviewed long term cardiovascular and renal outcomes of uncontrolled blood sugar - Reviewed goal A1c, goal fasting, and goal 2 hour post prandial glucose - Reviewed dietary modifications including FOLLOWING A HEART HEALTHY DIET/HEALTHY PLATE METHOD - Recommend to continue current regimen - Patient denies personal or family history of multiple endocrine neoplasia type 2, medullary thyroid cancer; personal history of pancreatitis or gallbladder  disease. - Recommend to check glucose daily (fasting) or if symptomatic - Meets financial criteria for Ozempic patient assistance program through [employer] PAP. Will re-enroll patient online for 2025   Follow Up Plan: 1 month  Kieth Brightly, PharmD, BCACP, CPP Clinical Pharmacist, [facility]

## 2023-01-15 NOTE — Progress Notes (Signed)
Patient name: Joel Reed. MRN: [id number] DOB: [birth year] Sex: male  REASON FOR CONSULT: Decreased pedal pulses to feet  HPI: Joel Reed. is a 72 y.o. male, with history diabetes, hypertension, hyperlipidemia that presents for evaluation of decreased pedal pulses in feet.  Patient states he saw his PCP for his annual visit and was told that the pulse in his left foot was decreased.  He denies any leg pain when walking.  He has no pain in his feet at night.  He has no open wounds.  No prior revascularizations.  Does not smoke.  Tries to keep his diabetes well controlled.    Past Medical History:  Diagnosis Date   Allergy    seasonal   Celiac disease    Cirrhosis (HCC)    Diabetes mellitus without complication (HCC)    Gastritis and duodenitis    on egd 12/2017   Hyperlipidemia    Patient denies   Hypertension    IDA (iron deficiency anemia)    Internal hemorrhoids    Nephrolithiasis    PVD (peripheral vascular disease) (HCC)    decreased TBI in right foot.   Right buttock pain 07/29/2018   Sleep apnea    no cpap   Splenomegaly    Tubular adenoma of colon     Past Surgical History:  Procedure Laterality Date   COLONOSCOPY     JOINT REPLACEMENT     left hip Dr [facility]ston [facility]   POLYPECTOMY     SPINE SURGERY     disectomy   UPPER GASTROINTESTINAL ENDOSCOPY  12/16/2017    Family History  Problem Relation Age of Onset   Esophageal cancer Father    Breast cancer Sister        bone cancer   Heart attack Brother 20   Cancer - Other Sister        cervical?   Colon polyps Mother    Colon cancer Neg Hx    Rectal cancer Neg Hx    Stomach cancer Neg Hx     SOCIAL HISTORY: Social History   Socioeconomic History   Marital status: Married    Spouse name: Not on file   Number of children: Not on file   Years of education: Not on file   Highest education level: Not on file  Occupational History   Not on file  Tobacco Use   Smoking status: Never   Smokeless  tobacco: Never  Vaping Use   Vaping status: Never Used  Substance and Sexual Activity   Alcohol use: No    Comment: quit 1990   Drug use: No   Sexual activity: Yes  Other Topics Concern   Not on file  Social History Narrative   Not on file   Social Determinants of Health   Financial Resource Strain: Low Risk  (03/22/2022)   Overall Financial Resource Strain (CARDIA)    Difficulty of Paying Living Expenses: Not hard at all  Food Insecurity: No Food Insecurity (03/22/2022)   Hunger Vital Sign    Worried About Running Out of Food in the Last Year: Never true    Ran Out of Food in the Last Year: Never true  Transportation Needs: No Transportation Needs (03/22/2022)   PRAPARE - Administrator, Civil Service (Medical): No    Lack of Transportation (Non-Medical): No  Physical Activity: Sufficiently Active (03/22/2022)   Exercise Vital Sign    Days of Exercise per Week: 5 days  Minutes of Exercise per Session: 30 min  Stress: No Stress Concern Present (03/22/2022)   [employer] of Occupational Health - Occupational Stress Questionnaire    Feeling of Stress : Not at all  Social Connections: Socially Integrated (03/22/2022)   Social Connection and Isolation Panel [NHANES]    Frequency of Communication with Friends and Family: More than three times a week    Frequency of Social Gatherings with Friends and Family: Three times a week    Attends Religious Services: More than 4 times per year    Active Member of Clubs or Organizations: Yes    Attends Banker Meetings: More than 4 times per year    Marital Status: Married  Catering manager Violence: Not At Risk (03/22/2022)   Humiliation, Afraid, Rape, and Kick questionnaire    Fear of Current or Ex-Partner: No    Emotionally Abused: No    Physically Abused: No    Sexually Abused: No    Allergies  Allergen Reactions   Gluten Meal     Current Outpatient Medications  Medication Sig Dispense Refill   amLODipine  (NORVASC) 5 MG tablet Take 1 tablet (5 mg total) by mouth daily. 90 tablet 3   cyanocobalamin (VITAMIN B12) 500 MCG tablet Take 1 tablet (500 mcg total) by mouth daily. 90 tablet 3   fluticasone (FLONASE) 50 MCG/ACT nasal spray Place 2 sprays into both nostrils daily. 16 g 2   glucose blood (CONTOUR NEXT TEST) test strip USE 1 STRIP TO CHECK GLUCOSE ONCE DAILY 300 strip 3   ketoconazole (NIZORAL) 2 % cream Apply topically 2 (two) times daily.     losartan (COZAAR) 100 MG tablet Take 1 tablet (100 mg total) by mouth daily. 90 tablet 3   metFORMIN (GLUCOPHAGE) 1000 MG tablet Take 1 tablet (1,000 mg total) by mouth 2 (two) times daily with a meal. 180 tablet 3   metoprolol succinate (TOPROL-XL) 25 MG 24 hr tablet Take 1 tablet (25 mg total) by mouth daily. 90 tablet 3   Semaglutide, 1 MG/DOSE, 4 MG/3ML SOPN Inject 1 mg as directed once a week. 3 mL 3   tamsulosin (FLOMAX) 0.4 MG CAPS capsule Take 1 capsule (0.4 mg total) by mouth daily. (Patient not taking: Reported on 12/24/2022) 30 capsule 3   No current facility-administered medications for this visit.    REVIEW OF SYSTEMS:  [X]  denotes positive finding, [ ]  denotes negative finding Cardiac  Comments:  Chest pain or chest pressure:    Shortness of breath upon exertion:    Short of breath when lying flat:    Irregular heart rhythm:        Vascular    Pain in calf, thigh, or hip brought on by ambulation:    Pain in feet at night that wakes you up from your sleep:     Blood clot in your veins:    Leg swelling:         Pulmonary    Oxygen at home:    Productive cough:     Wheezing:         Neurologic    Sudden weakness in arms or legs:     Sudden numbness in arms or legs:     Sudden onset of difficulty speaking or slurred speech:    Temporary loss of vision in one eye:     Problems with dizziness:         Gastrointestinal    Blood in stool:  Vomited blood:         Genitourinary    Burning when urinating:     Blood in  urine:        Psychiatric    Major depression:         Hematologic    Bleeding problems:    Problems with blood clotting too easily:        Skin    Rashes or ulcers:        Constitutional    Fever or chills:      PHYSICAL EXAM: There were no vitals filed for this visit.  GENERAL: The patient is a well-nourished male, in no acute distress. The vital signs are documented above. CARDIAC: There is a regular rate and rhythm.  VASCULAR:  Bilateral femoral pulses palpable Bilateral DP PT pulses palpable No tissue loss PULMONARY: No respiratory distress. ABDOMEN: Soft and non-tender. MUSCULOSKELETAL: There are no major deformities or cyanosis. NEUROLOGIC: No focal weakness or paresthesias are detected. SKIN: There are no ulcers or rashes noted. PSYCHIATRIC: The patient has a normal affect.  DATA:   ABI's today 1.09 right triphasic and 1.18 left triphasic  - both normal range  Assessment/Plan:  72 y.o. male, with history of diabetes, hypertension, hyperlipidemia that presents for evaluation of decreased pedal pulses in feet.  I discussed that I do not think he has any significant arterial insufficiency.  He has palpable dorsalis pedis and posterior tibial pulses bilaterally.  His ABIs today are normal with normal triphasic waveforms at the ankle.  He is asymptomatic.  I think he can follow-up with me as needed.   Cephus Shelling, MD [facility] Office: [phone]

## 2023-01-21 DIAGNOSIS — Z85820 Personal history of malignant melanoma of skin: Secondary | ICD-10-CM | POA: Diagnosis not present

## 2023-01-21 DIAGNOSIS — L988 Other specified disorders of the skin and subcutaneous tissue: Secondary | ICD-10-CM | POA: Diagnosis not present

## 2023-01-21 DIAGNOSIS — D485 Neoplasm of uncertain behavior of skin: Secondary | ICD-10-CM | POA: Diagnosis not present

## 2023-01-21 DIAGNOSIS — Z08 Encounter for follow-up examination after completed treatment for malignant neoplasm: Secondary | ICD-10-CM | POA: Diagnosis not present

## 2023-02-18 ENCOUNTER — Other Ambulatory Visit: Payer: Self-pay | Admitting: Family Medicine

## 2023-02-18 DIAGNOSIS — I10 Essential (primary) hypertension: Secondary | ICD-10-CM

## 2023-02-20 ENCOUNTER — Other Ambulatory Visit: Payer: Self-pay | Admitting: Family Medicine

## 2023-02-20 DIAGNOSIS — I10 Essential (primary) hypertension: Secondary | ICD-10-CM

## 2023-02-21 DIAGNOSIS — K7460 Unspecified cirrhosis of liver: Secondary | ICD-10-CM | POA: Diagnosis not present

## 2023-02-21 DIAGNOSIS — K769 Liver disease, unspecified: Secondary | ICD-10-CM | POA: Diagnosis not present

## 2023-02-21 DIAGNOSIS — K7469 Other cirrhosis of liver: Secondary | ICD-10-CM | POA: Diagnosis not present

## 2023-02-21 DIAGNOSIS — C220 Liver cell carcinoma: Secondary | ICD-10-CM | POA: Diagnosis not present

## 2023-03-11 ENCOUNTER — Other Ambulatory Visit: Payer: Self-pay | Admitting: Family Medicine

## 2023-03-11 DIAGNOSIS — E118 Type 2 diabetes mellitus with unspecified complications: Secondary | ICD-10-CM

## 2023-03-11 DIAGNOSIS — I10 Essential (primary) hypertension: Secondary | ICD-10-CM

## 2023-03-19 ENCOUNTER — Telehealth: Payer: Self-pay

## 2023-03-19 ENCOUNTER — Other Ambulatory Visit (HOSPITAL_COMMUNITY): Payer: Self-pay

## 2023-03-19 NOTE — Progress Notes (Deleted)
 Pharmacy Medication Assistance Program Note    03/19/2023  Patient ID: Joel Pryer., male   DOB: [birth year], 73 y.o.   MRN: [id number]     03/19/2023  Outreach Medication One  Manufacturer Medication One Novo Nordisk  Nordisk Drugs Ozempic   Dose of Ozempic  1MG   Type of Radiographer, Therapeutic Assistance  Date Application Submitted to Manufacturer 01/02/2023  Method Application Sent to [employer] submitted via fax (2nd attempt) 03/19/23

## 2023-03-21 ENCOUNTER — Other Ambulatory Visit (HOSPITAL_COMMUNITY): Payer: Self-pay

## 2023-03-21 ENCOUNTER — Telehealth: Payer: Self-pay

## 2023-03-21 ENCOUNTER — Other Ambulatory Visit: Payer: Self-pay

## 2023-03-21 MED ORDER — SEMAGLUTIDE (1 MG/DOSE) 4 MG/3ML ~~LOC~~ SOPN: 1.0000 mg | PEN_INJECTOR | SUBCUTANEOUS | 3 refills | Status: AC

## 2023-03-21 NOTE — Progress Notes (Signed)
 Pharmacy Medication Assistance Program Note    03/21/2023  Patient ID: Joel Rossman., male   DOB: [birth year], 73 y.o.   MRN: [id number]     03/19/2023  Outreach Medication One  Manufacturer Medication One Novo Nordisk  Nordisk Drugs Ozempic   Dose of Ozempic  1MG   Type of Radiographer, Therapeutic Assistance  Date Application Submitted to Manufacturer 01/02/2023  Method Application Sent to Manufacturer Online  Patient Assistance Determination Approved  Approval Start Date 03/21/2023  Approval End Date 02/12/2024     Shipment processing 03/21/23 - should arrive in 10-14 business days. Will ship to office.

## 2023-03-21 NOTE — Telephone Encounter (Signed)
 Copied from CRM [id number]. Topic: Clinical - Medication Question >> Mar 21, 2023  9:27 AM Carlatta H wrote: Reason for CRM: Patient needs a phone call from the Pharmacist or Pharmacy Technician regarding his ozempic //Please call patient

## 2023-03-22 ENCOUNTER — Encounter: Payer: Self-pay | Admitting: Pharmacist

## 2023-03-26 ENCOUNTER — Other Ambulatory Visit: Payer: Self-pay | Admitting: Family Medicine

## 2023-03-26 DIAGNOSIS — E118 Type 2 diabetes mellitus with unspecified complications: Secondary | ICD-10-CM

## 2023-03-28 ENCOUNTER — Ambulatory Visit: Payer: Medicare Other | Admitting: *Deleted

## 2023-03-28 DIAGNOSIS — Z0000 Encounter for general adult medical examination without abnormal findings: Secondary | ICD-10-CM | POA: Diagnosis not present

## 2023-03-28 NOTE — Progress Notes (Signed)
Subjective:   Joel Reed. is a 73 y.o. male who presents for Medicare Annual/Subsequent preventive examination.  Visit Complete: Virtual I connected with  Noah Delaine. on 03/28/23 by a audio enabled telemedicine application and verified that I am speaking with the correct person using two identifiers.  Patient Location: Home  Provider Location: Home Office  I discussed the limitations of evaluation and management by telemedicine. The patient expressed understanding and agreed to proceed.  Vital Signs: Because this visit was a virtual/telehealth visit, some criteria may be missing or patient reported. Any vitals not documented were not able to be obtained and vitals that have been documented are patient reported.  Cardiac Risk Factors include: advanced age (>5men, >3 women);male gender;hypertension     Objective:    There were no vitals filed for this visit. There is no height or weight on file to calculate BMI.     03/28/2023    2:45 PM 03/22/2022    3:26 PM 05/24/2020    8:31 AM 01/25/2020    2:05 PM 07/02/2018    1:48 PM 01/28/2018    3:26 PM 01/14/2018    9:21 AM  Advanced Directives  Does Patient Have a Medical Advance Directive? Yes Yes Yes Yes No;Yes Yes Yes  Type of Diplomatic Services operational officer Living will  Healthcare Power of Independence;Living will Living will Living will Living will  Does patient want to make changes to medical advance directive?  No - Patient declined No - Patient declined No - Patient declined No - Patient declined No - Patient declined No - Patient declined  Copy of Healthcare Power of Attorney in Chart? No - copy requested   No - copy requested     Would patient like information on creating a medical advance directive?     No - Patient declined      Current Medications (verified) Outpatient Encounter Medications as of 03/28/2023  Medication Sig   amLODipine (NORVASC) 5 MG tablet Take 1 tablet by mouth once daily    CONTOUR NEXT TEST test strip USE 1 STRIP TO CHECK GLUCOSE ONCE DAILY   cyanocobalamin (VITAMIN B12) 500 MCG tablet Take 1 tablet (500 mcg total) by mouth daily.   fluticasone (FLONASE) 50 MCG/ACT nasal spray Place 2 sprays into both nostrils daily.   ketoconazole (NIZORAL) 2 % cream Apply topically 2 (two) times daily.   losartan (COZAAR) 100 MG tablet Take 1 tablet by mouth once daily   metFORMIN (GLUCOPHAGE) 1000 MG tablet TAKE 1 TABLET BY MOUTH TWICE DAILY WITH A MEAL . APPOINTMENT REQUIRED FOR FUTURE REFILLS   metoprolol succinate (TOPROL-XL) 25 MG 24 hr tablet Take 1 tablet (25 mg total) by mouth daily.   Semaglutide, 1 MG/DOSE, 4 MG/3ML SOPN Inject 1 mg as directed once a week.   tamsulosin (FLOMAX) 0.4 MG CAPS capsule Take 1 capsule (0.4 mg total) by mouth daily.   No facility-administered encounter medications on file as of 03/28/2023.    Allergies (verified) Gluten meal   History: Past Medical History:  Diagnosis Date   Allergy    seasonal   Celiac disease    Cirrhosis (HCC)    Diabetes mellitus without complication (HCC)    Gastritis and duodenitis    on egd 12/2017   Hyperlipidemia    Patient denies   Hypertension    IDA (iron deficiency anemia)    Internal hemorrhoids    Nephrolithiasis    PVD (peripheral vascular disease) (HCC)  decreased TBI in right foot.   Right buttock pain 07/29/2018   Sleep apnea    no cpap   Splenomegaly    Tubular adenoma of colon    Past Surgical History:  Procedure Laterality Date   COLONOSCOPY     JOINT REPLACEMENT     left hip Dr Thurston Hole   POLYPECTOMY     SPINE SURGERY     disectomy   UPPER GASTROINTESTINAL ENDOSCOPY  12/16/2017   Family History  Problem Relation Age of Onset   Esophageal cancer Father    Breast cancer Sister        bone cancer   Heart attack Brother 56   Cancer - Other Sister        cervical?   Colon polyps Mother    Colon cancer Neg Hx    Rectal cancer Neg Hx    Stomach cancer Neg Hx    Social  History   Socioeconomic History   Marital status: Married    Spouse name: Not on file   Number of children: Not on file   Years of education: Not on file   Highest education level: Not on file  Occupational History   Not on file  Tobacco Use   Smoking status: Never   Smokeless tobacco: Never  Vaping Use   Vaping status: Never Used  Substance and Sexual Activity   Alcohol use: No    Comment: quit 1990   Drug use: No   Sexual activity: Yes  Other Topics Concern   Not on file  Social History Narrative   Not on file   Social Drivers of Health   Financial Resource Strain: Low Risk  (03/28/2023)   Overall Financial Resource Strain (CARDIA)    Difficulty of Paying Living Expenses: Not hard at all  Food Insecurity: No Food Insecurity (03/28/2023)   Hunger Vital Sign    Worried About Running Out of Food in the Last Year: Never true    Ran Out of Food in the Last Year: Never true  Transportation Needs: No Transportation Needs (03/28/2023)   PRAPARE - Administrator, Civil Service (Medical): No    Lack of Transportation (Non-Medical): No  Physical Activity: Insufficiently Active (03/28/2023)   Exercise Vital Sign    Days of Exercise per Week: 3 days    Minutes of Exercise per Session: 30 min  Stress: No Stress Concern Present (03/28/2023)   [employer] of Occupational Health - Occupational Stress Questionnaire    Feeling of Stress : Not at all  Social Connections: Moderately Isolated (03/28/2023)   Social Connection and Isolation Panel [NHANES]    Frequency of Communication with Friends and Family: More than three times a week    Frequency of Social Gatherings with Friends and Family: More than three times a week    Attends Religious Services: Never    Database administrator or Organizations: No    Attends Engineer, structural: Never    Marital Status: Married    Tobacco Counseling Counseling given: Not Answered   Clinical Intake:  Pre-visit  preparation completed: Yes  Pain : No/denies pain     Diabetes: Yes CBG done?: No Did pt. bring in CBG monitor from home?: No  How often do you need to have someone help you when you read instructions, pamphlets, or other written materials from your doctor or pharmacy?: 1 - Never  Interpreter Needed?: No  Information entered by :: Remi Haggard LPN   Activities  of Daily Living    03/28/2023    2:46 PM  In your present state of health, do you have any difficulty performing the following activities:  Hearing? 0  Vision? 0  Difficulty concentrating or making decisions? 0  Walking or climbing stairs? 0  Dressing or bathing? 0  Doing errands, shopping? 0  Preparing Food and eating ? N  Using the Toilet? N  In the past six months, have you accidently leaked urine? N  Do you have problems with loss of bowel control? N  Managing your Medications? N  Managing your Finances? N  Housekeeping or managing your Housekeeping? N    Patient Care Team: Donita Brooks, MD as PCP - General (Family Medicine) Radonna Ricker, RN (Inactive) as Oncology Nurse Navigator Ladene Artist, MD as Consulting Physician (Oncology) Loleta Rose (Physician Assistant) Daisy Lazar, DO (Optometry)  Indicate any recent Medical Services you may have received from other than [facility] providers in the past year (date may be approximate).     Assessment:   This is a routine wellness examination for [facility].  Hearing/Vision screen Hearing Screening - Comments:: No trouble hearing Vision Screening - Comments:: [facility] Up to date   Goals Addressed             This Visit's Progress    Weight (lb) < 200 lb (90.7 kg)         Depression Screen    03/28/2023    2:49 PM 04/09/2022   11:52 AM 03/22/2022    3:39 PM 01/16/2022   10:08 AM 12/11/2021   10:57 AM 05/24/2020    8:27 AM 08/12/2018    9:40 AM  PHQ 2/9 Scores  PHQ - 2 Score 3 0 0 0 0 0 0  PHQ- 9 Score 3     0     Fall  Risk    03/28/2023    2:43 PM 04/09/2022   11:52 AM 03/22/2022    3:52 PM 01/16/2022   10:08 AM 12/11/2021   10:57 AM  Fall Risk   Falls in the past year? 0 0 0 0 0  Number falls in past yr: 0 0 0 0 0  Injury with Fall? 0 0 0 0 0  Risk for fall due to :  No Fall Risks  No Fall Risks No Fall Risks  Follow up Falls evaluation completed;Education provided;Falls prevention discussed Falls prevention discussed Falls prevention discussed;Education provided;Falls evaluation completed Falls prevention discussed Falls prevention discussed    MEDICARE RISK AT HOME: Medicare Risk at Home Any stairs in or around the home?: Yes If so, are there any without handrails?: No Home free of loose throw rugs in walkways, pet beds, electrical cords, etc?: Yes Adequate lighting in your home to reduce risk of falls?: Yes Life alert?: No Use of a cane, walker or w/c?: No Grab bars in the bathroom?: Yes Shower chair or bench in shower?: Yes Elevated toilet seat or a handicapped toilet?: Yes  TIMED UP AND GO:  Was the test performed?  No    Cognitive Function:        03/28/2023    2:47 PM 03/22/2022    3:27 PM  6CIT Screen  What Year? 0 points 0 points  What month? 0 points 0 points  What time? 0 points 0 points  Count back from 20 0 points 0 points  Months in reverse 0 points 0 points  Repeat phrase 0 points 0 points  Total Score 0  points 0 points    Immunizations Immunization History  Administered Date(s) Administered   Fluad Quad(high Dose 65+) 01/25/2022   Fluad Trivalent(High Dose 65+) 12/24/2022   Hep A / Hep B 02/24/2020, 11/23/2020, 01/25/2022   Hepb-cpg 06/08/2020   Influenza, High Dose Seasonal PF 01/13/2020   Influenza,inj,Quad PF,6+ Mos 02/20/2016, 11/23/2020   Influenza-Unspecified 01/13/2020   Moderna Sars-Covid-2 Vaccination 02/19/2020, 03/18/2020   Pneumococcal Conjugate-13 11/13/2018   Pneumococcal Polysaccharide-23 03/29/2017   Tdap 02/13/2015   Zoster  Recombinant(Shingrix) 06/08/2020, 11/23/2020    TDAP status: Up to date  Flu Vaccine status: Up to date  Pneumococcal vaccine status: Up to date  Covid-19 vaccine status: Information provided on how to obtain vaccines.   Qualifies for Shingles Vaccine? No   Zostavax completed Yes   Shingrix Completed?: Yes  Screening Tests Health Maintenance  Topic Date Due   COVID-19 Vaccine (3 - Moderna risk series) 04/15/2020   Pneumonia Vaccine 5+ Years old (3 of 3 - PPSV23 or PCV20) 03/29/2022   OPHTHALMOLOGY EXAM  04/12/2023 (Originally 06/15/2022)   Colonoscopy  12/24/2023 (Originally 12/17/2022)   FOOT EXAM  06/21/2023   HEMOGLOBIN A1C  06/23/2023   Diabetic kidney evaluation - eGFR measurement  12/24/2023   Diabetic kidney evaluation - Urine ACR  12/24/2023   Medicare Annual Wellness (AWV)  03/27/2024   DTaP/Tdap/Td (2 - Td or Tdap) 02/12/2025   INFLUENZA VACCINE  Completed   Hepatitis C Screening  Completed   Zoster Vaccines- Shingrix  Completed   HPV VACCINES  Aged Out    Health Maintenance  Health Maintenance Due  Topic Date Due   COVID-19 Vaccine (3 - Moderna risk series) 04/15/2020   Pneumonia Vaccine 20+ Years old (3 of 3 - PPSV23 or PCV20) 03/29/2022    Colonoscopy Declined  Lung Cancer Screening: (Low Dose CT Chest recommended if Age 21-80 years, 20 pack-year currently smoking OR have quit w/in 15years.) does not qualify.   Lung Cancer Screening Referral:   Additional Screening:  Hepatitis C Screening: does not qualify; Completed 2018  Vision Screening: Recommended annual ophthalmology exams for early detection of glaucoma and other disorders of the eye. Is the patient up to date with their annual eye exam?  Yes  Who is the provider or what is the name of the office in which the patient attends annual eye exams? [facility] If pt is not established with a provider, would they like to be referred to a provider to establish care? No .   Dental Screening: Recommended  annual dental exams for proper oral hygiene  Nutrition Risk Assessment:  Has the patient had any N/V/D within the last 2 months?  No  Does the patient have any non-healing wounds?  No  Has the patient had any unintentional weight loss or weight gain?  No   Diabetes:  Is the patient diabetic?  Yes  If diabetic, was a CBG obtained today?  No  Did the patient bring in their glucometer from home?  No  How often do you monitor your CBG's? 1 x a day.   Financial Strains and Diabetes Management:  Are you having any financial strains with the device, your supplies or your medication? No .  Does the patient want to be seen by Chronic Care Management for management of their diabetes?  No  Would the patient like to be referred to a Nutritionist or for Diabetic Management?  No   Diabetic Exams:  Diabetic Eye Exam: Completed .  Pt has been advised about  the importance in completing this exam.   Diabetic Foot Exam: . Pt has been advised about the importance in completing this exam.   Community Resource Referral / Chronic Care Management: CRR required this visit?  No   CCM required this visit?  No     Plan:     I have personally reviewed and noted the following in the patient's chart:   Medical and social history Use of alcohol, tobacco or illicit drugs  Current medications and supplements including opioid prescriptions. Patient is not currently taking opioid prescriptions. Functional ability and status Nutritional status Physical activity Advanced directives List of other physicians Hospitalizations, surgeries, and ER visits in previous 12 months Vitals Screenings to include cognitive, depression, and falls Referrals and appointments  In addition, I have reviewed and discussed with patient certain preventive protocols, quality metrics, and best practice recommendations. A written personalized care plan for preventive services as well as general preventive health recommendations  were provided to patient.     Remi Haggard, LPN   1/61/0960   After Visit Summary: (MyChart) Due to this being a telephonic visit, the after visit summary with patients personalized plan was offered to patient via MyChart   Nurse Notes:

## 2023-03-28 NOTE — Patient Instructions (Signed)
[name] , Thank you for taking time to come for your Medicare Wellness Visit. I appreciate your ongoing commitment to your health goals. Please review the following plan we discussed and let me know if I can assist you in the future.   Screening recommendations/referrals: Colonoscopy:  Recommended yearly ophthalmology/optometry visit for glaucoma screening and checkup Recommended yearly dental visit for hygiene and checkup  Vaccinations: Influenza vaccine:  Pneumococcal vaccine:  Tdap vaccine:  Shingles vaccine:       Preventive Care 65 Years and Older, Male Preventive care refers to lifestyle choices and visits with your health care provider that can promote health and wellness. What does preventive care include? A yearly physical exam. This is also called an annual well check. Dental exams once or twice a year. Routine eye exams. Ask your health care provider how often you should have your eyes checked. Personal lifestyle choices, including: Daily care of your teeth and gums. Regular physical activity. Eating a healthy diet. Avoiding tobacco and drug use. Limiting alcohol use. Practicing safe sex. Taking low doses of aspirin every day. Taking vitamin and mineral supplements as recommended by your health care provider. What happens during an annual well check? The services and screenings done by your health care provider during your annual well check will depend on your age, overall health, lifestyle risk factors, and family history of disease. Counseling  Your health care provider may ask you questions about your: Alcohol use. Tobacco use. Drug use. Emotional well-being. Home and relationship well-being. Sexual activity. Eating habits. History of falls. Memory and ability to understand (cognition). Work and work Astronomer. Screening  You may have the following tests or measurements: Height, weight, and BMI. Blood pressure. Lipid and cholesterol levels. These may  be checked every 5 years, or more frequently if you are over 34 years old. Skin check. Lung cancer screening. You may have this screening every year starting at age 108 if you have a 30-pack-year history of smoking and currently smoke or have quit within the past 15 years. Fecal occult blood test (FOBT) of the stool. You may have this test every year starting at age 21. Flexible sigmoidoscopy or colonoscopy. You may have a sigmoidoscopy every 5 years or a colonoscopy every 10 years starting at age 5. Prostate cancer screening. Recommendations will vary depending on your family history and other risks. Hepatitis C blood test. Hepatitis B blood test. Sexually transmitted disease (STD) testing. Diabetes screening. This is done by checking your blood sugar (glucose) after you have not eaten for a while (fasting). You may have this done every 1-3 years. Abdominal aortic aneurysm (AAA) screening. You may need this if you are a current or former smoker. Osteoporosis. You may be screened starting at age 97 if you are at high risk. Talk with your health care provider about your test results, treatment options, and if necessary, the need for more tests. Vaccines  Your health care provider may recommend certain vaccines, such as: Influenza vaccine. This is recommended every year. Tetanus, diphtheria, and acellular pertussis (Tdap, Td) vaccine. You may need a Td booster every 10 years. Zoster vaccine. You may need this after age 69. Pneumococcal 13-valent conjugate (PCV13) vaccine. One dose is recommended after age 52. Pneumococcal polysaccharide (PPSV23) vaccine. One dose is recommended after age 33. Talk to your health care provider about which screenings and vaccines you need and how often you need them. This information is not intended to replace advice given to you by your health care  provider. Make sure you discuss any questions you have with your health care provider. Document Released: 02/25/2015  Document Revised: 10/19/2015 Document Reviewed: 11/30/2014 Elsevier Interactive Patient Education  2017 [employer].  Fall Prevention in the Home Falls can cause injuries. They can happen to people of all ages. There are many things you can do to make your home safe and to help prevent falls. What can I do on the outside of my home? Regularly fix the edges of walkways and driveways and fix any cracks. Remove anything that might make you trip as you walk through a door, such as a raised step or threshold. Trim any bushes or trees on the path to your home. Use bright outdoor lighting. Clear any walking paths of anything that might make someone trip, such as rocks or tools. Regularly check to see if handrails are loose or broken. Make sure that both sides of any steps have handrails. Any raised decks and porches should have guardrails on the edges. Have any leaves, snow, or ice cleared regularly. Use sand or salt on walking paths during winter. Clean up any spills in your garage right away. This includes oil or grease spills. What can I do in the bathroom? Use night lights. Install grab bars by the toilet and in the tub and shower. Do not use towel bars as grab bars. Use non-skid mats or decals in the tub or shower. If you need to sit down in the shower, use a plastic, non-slip stool. Keep the floor dry. Clean up any water that spills on the floor as soon as it happens. Remove soap buildup in the tub or shower regularly. Attach bath mats securely with double-sided non-slip rug tape. Do not have throw rugs and other things on the floor that can make you trip. What can I do in the bedroom? Use night lights. Make sure that you have a light by your bed that is easy to reach. Do not use any sheets or blankets that are too big for your bed. They should not hang down onto the floor. Have a firm chair that has side arms. You can use this for support while you get dressed. Do not have throw rugs  and other things on the floor that can make you trip. What can I do in the kitchen? Clean up any spills right away. Avoid walking on wet floors. Keep items that you use a lot in easy-to-reach places. If you need to reach something above you, use a strong step stool that has a grab bar. Keep electrical cords out of the way. Do not use floor polish or wax that makes floors slippery. If you must use wax, use non-skid floor wax. Do not have throw rugs and other things on the floor that can make you trip. What can I do with my stairs? Do not leave any items on the stairs. Make sure that there are handrails on both sides of the stairs and use them. Fix handrails that are broken or loose. Make sure that handrails are as long as the stairways. Check any carpeting to make sure that it is firmly attached to the stairs. Fix any carpet that is loose or worn. Avoid having throw rugs at the top or bottom of the stairs. If you do have throw rugs, attach them to the floor with carpet tape. Make sure that you have a light switch at the top of the stairs and the bottom of the stairs. If you do not have  them, ask someone to add them for you. What else can I do to help prevent falls? Wear shoes that: Do not have high heels. Have rubber bottoms. Are comfortable and fit you well. Are closed at the toe. Do not wear sandals. If you use a stepladder: Make sure that it is fully opened. Do not climb a closed stepladder. Make sure that both sides of the stepladder are locked into place. Ask someone to hold it for you, if possible. Clearly mark and make sure that you can see: Any grab bars or handrails. First and last steps. Where the edge of each step is. Use tools that help you move around (mobility aids) if they are needed. These include: Canes. Walkers. Scooters. Crutches. Turn on the lights when you go into a dark area. Replace any light bulbs as soon as they burn out. Set up your furniture so you have a  clear path. Avoid moving your furniture around. If any of your floors are uneven, fix them. If there are any pets around you, be aware of where they are. Review your medicines with your doctor. Some medicines can make you feel dizzy. This can increase your chance of falling. Ask your doctor what other things that you can do to help prevent falls. This information is not intended to replace advice given to you by your health care provider. Make sure you discuss any questions you have with your health care provider. Document Released: 11/25/2008 Document Revised: 07/07/2015 Document Reviewed: 03/05/2014 Elsevier Interactive Patient Education  2017 [employer].

## 2023-04-09 NOTE — Telephone Encounter (Signed)
 Joel Reed, I called the automated system and there is no status for anything.  He is almost out.  Can you help him touch base?  I can bring him a sample so ensure him it's okay or they can give him a voucher.  He's a sweet family member of mine.

## 2023-04-14 ENCOUNTER — Other Ambulatory Visit: Payer: Self-pay | Admitting: Family Medicine

## 2023-04-14 DIAGNOSIS — I10 Essential (primary) hypertension: Secondary | ICD-10-CM

## 2023-04-15 ENCOUNTER — Other Ambulatory Visit: Payer: Self-pay

## 2023-04-15 ENCOUNTER — Other Ambulatory Visit: Payer: Self-pay | Admitting: Family Medicine

## 2023-04-15 DIAGNOSIS — I10 Essential (primary) hypertension: Secondary | ICD-10-CM

## 2023-04-15 MED ORDER — LOSARTAN POTASSIUM 100 MG PO TABS: 100.0000 mg | ORAL_TABLET | Freq: Every day | ORAL | 1 refills | Status: DC

## 2023-05-03 ENCOUNTER — Telehealth: Payer: Self-pay | Admitting: Pharmacist

## 2023-05-03 NOTE — Telephone Encounter (Signed)
 Patient has already used voucher and is down to 2 weeks; can you f/u? He will respond via my chart, but I'm worried his app is still held up Thanks!

## 2023-05-06 NOTE — Telephone Encounter (Signed)
 Kim, could you reach out to [employer] to see if his Ozempic 2mg  dose shipment has processed/shipped? If he doesn't receive in the next 2 weeks, he should be able to receive another voucher.

## 2023-05-14 ENCOUNTER — Other Ambulatory Visit: Payer: Self-pay

## 2023-05-14 DIAGNOSIS — J0140 Acute pansinusitis, unspecified: Secondary | ICD-10-CM

## 2023-05-14 MED ORDER — FLUTICASONE PROPIONATE 50 MCG/ACT NA SUSP: 2.0000 | Freq: Every day | NASAL | 2 refills | Status: AC

## 2023-05-20 ENCOUNTER — Other Ambulatory Visit: Payer: Self-pay | Admitting: Family Medicine

## 2023-05-20 DIAGNOSIS — I10 Essential (primary) hypertension: Secondary | ICD-10-CM

## 2023-05-20 MED ORDER — AMLODIPINE BESYLATE 5 MG PO TABS: 5.0000 mg | ORAL_TABLET | Freq: Every day | ORAL | 0 refills | Status: DC

## 2023-05-21 NOTE — Telephone Encounter (Signed)
 amLODipine (NORVASC) 5 MG tablet 90 tablet 0 05/20/2023 --   Sig - Route: Take 1 tablet (5 mg total) by mouth daily. - Oral   Sent to pharmacy as: amLODipine (NORVASC) 5 MG tablet   E-Prescribing Status: Receipt confirmed by pharmacy (05/20/2023 11:43 AM EDT)    Requested Prescriptions  Pending Prescriptions Disp Refills   amLODipine (NORVASC) 5 MG tablet [Pharmacy Med Name: amLODIPine Besylate 5 MG Oral Tablet] 90 tablet 0    Sig: Take 1 tablet by mouth once daily     Cardiovascular: Calcium Channel Blockers 2 Failed - 05/21/2023 12:55 PM      Failed - Last BP in normal range    BP Readings from Last 1 Encounters:  01/15/23 (!) 159/74         Passed - Last Heart Rate in normal range    Pulse Readings from Last 1 Encounters:  01/15/23 75         Passed - Valid encounter within last 6 months    Recent Outpatient Visits           4 months ago NAFLD (nonalcoholic fatty liver disease)   [place] [facility] Donita Brooks, MD   11 months ago NAFLD (nonalcoholic fatty liver disease)   [place] [facility] Pickard, Priscille Heidelberg, MD   1 year ago Right sided sciatica   [place] [facility] Donita Brooks, MD   1 year ago Right sided sciatica   [facility] Donita Brooks, MD   1 year ago Cellulitis of right leg   [place] [facility] Pickard, Priscille Heidelberg, MD

## 2023-06-14 DIAGNOSIS — M546 Pain in thoracic spine: Secondary | ICD-10-CM | POA: Diagnosis not present

## 2023-06-14 DIAGNOSIS — M9905 Segmental and somatic dysfunction of pelvic region: Secondary | ICD-10-CM | POA: Diagnosis not present

## 2023-06-14 DIAGNOSIS — M9903 Segmental and somatic dysfunction of lumbar region: Secondary | ICD-10-CM | POA: Diagnosis not present

## 2023-06-14 DIAGNOSIS — M9902 Segmental and somatic dysfunction of thoracic region: Secondary | ICD-10-CM | POA: Diagnosis not present

## 2023-06-14 DIAGNOSIS — M62830 Muscle spasm of back: Secondary | ICD-10-CM | POA: Diagnosis not present

## 2023-06-18 DIAGNOSIS — M9905 Segmental and somatic dysfunction of pelvic region: Secondary | ICD-10-CM | POA: Diagnosis not present

## 2023-06-18 DIAGNOSIS — M62830 Muscle spasm of back: Secondary | ICD-10-CM | POA: Diagnosis not present

## 2023-06-18 DIAGNOSIS — M9902 Segmental and somatic dysfunction of thoracic region: Secondary | ICD-10-CM | POA: Diagnosis not present

## 2023-06-18 DIAGNOSIS — M9903 Segmental and somatic dysfunction of lumbar region: Secondary | ICD-10-CM | POA: Diagnosis not present

## 2023-06-18 DIAGNOSIS — M546 Pain in thoracic spine: Secondary | ICD-10-CM | POA: Diagnosis not present

## 2023-07-01 ENCOUNTER — Other Ambulatory Visit: Payer: Self-pay | Admitting: Family Medicine

## 2023-07-01 DIAGNOSIS — E118 Type 2 diabetes mellitus with unspecified complications: Secondary | ICD-10-CM

## 2023-07-03 NOTE — Telephone Encounter (Signed)
 Requested medication (s) are due for refill today: yes  Requested medication (s) are on the active medication list: yes  Last refill:  metformin  03/26/23 Future visit scheduled: yes  Notes to clinic:  Unable to refill per protocol due to failed labs, no updated results.      Requested Prescriptions  Pending Prescriptions Disp Refills   CONTOUR NEXT TEST test strip [Pharmacy Med Name: Contour Next Test In Vitro Strip] 100 each 0    Sig: USE 1 STRIP TO CHECK GLUCOSE ONCE DAILY     Endocrinology: Diabetes - Testing Supplies Passed - 07/03/2023 10:16 AM      Passed - Valid encounter within last 12 months    Recent Outpatient Visits           6 months ago NAFLD (nonalcoholic fatty liver disease)   [place] [facility] Austine Lefort, MD   1 year ago NAFLD (nonalcoholic fatty liver disease)   [place] [facility] Pickard, Cisco Crest, MD   1 year ago Right sided sciatica   [place] [facility] Medicine Austine Lefort, MD   1 year ago Right sided sciatica    [facility] Medicine Austine Lefort, MD   1 year ago Cellulitis of right leg    [facility] Family Medicine Cheril Cork, Cisco Crest, MD               metFORMIN  (GLUCOPHAGE ) 1000 MG tablet [Pharmacy Med Name: metFORMIN  HCl 1000 MG Oral Tablet] 180 tablet 0    Sig: TAKE 1 TABLET BY MOUTH TWICE DAILY WITH A MEAL. APPT REQUIRED FOR FUTURE REFILLS     Endocrinology:  Diabetes - Biguanides Failed - 07/03/2023 10:16 AM      Failed - HBA1C is between 0 and 7.9 and within 180 days    Hgb A1c MFr Bld  Date Value Ref Range Status  12/24/2022 5.4 <5.7 % of total Hgb Final    Comment:    For the purpose of screening for the presence of diabetes: . <5.7%       Consistent with the absence of diabetes 5.7-6.4%    Consistent with increased risk for diabetes             (prediabetes) > or =6.5%  Consistent with diabetes . This assay result is consistent  with a decreased risk of diabetes. . Currently, no consensus exists regarding use of hemoglobin A1c for diagnosis of diabetes in children. . According to American Diabetes Association (ADA) guidelines, hemoglobin A1c <7.0% represents optimal control in non-pregnant diabetic patients. Different metrics may apply to specific patient populations.  Standards of Medical Care in Diabetes(ADA). .          Failed - B12 Level in normal range and within 720 days    Vitamin B-12  Date Value Ref Range Status  01/21/2018 845 180 - 914 pg/mL Final    Comment:    (NOTE) This assay is not validated for testing neonatal or myeloproliferative syndrome specimens for Vitamin B12 levels. Performed at [facility], [address], [place], [place] [address]          Passed - Cr in normal range and within 360 days    Creat  Date Value Ref Range Status  12/24/2022 0.80 0.70 - 1.28 mg/dL Final   Creatinine, Urine  Date Value Ref Range Status  12/24/2022 103 20 - 320 mg/dL Final  Passed - eGFR in normal range and within 360 days    GFR, Est African American  Date Value Ref Range Status  07/11/2018 104 > OR = 60 mL/min/1.98m2 Final   GFR calc Af Amer  Date Value Ref Range Status  03/12/2019 >60 >60 mL/min Final    Comment:    Performed at [facility], [address] [place], [place] [address]   GFR, Est Non African American  Date Value Ref Range Status  07/11/2018 90 > OR = 60 mL/min/1.45m2 Final   GFR calc non Af Amer  Date Value Ref Range Status  03/12/2019 >60 >60 mL/min Final   GFR  Date Value Ref Range Status  01/25/2020 94.23 >60.00 mL/min Final    Comment:    Calculated using the CKD-EPI Creatinine Equation (2021)   eGFR  Date Value Ref Range Status  12/24/2022 95 > OR = 60 mL/min/1.76m2 Final         Passed - Valid encounter within last 6 months    Recent Outpatient Visits           6 months ago NAFLD (nonalcoholic fatty liver disease)   [place] [facility] Austine Lefort, MD   1 year ago NAFLD (nonalcoholic fatty liver disease)   [place] [facility] Medicine Austine Lefort, MD   1 year ago Right sided sciatica   [place] [facility] Medicine Austine Lefort, MD   1 year ago Right sided sciatica   [facility] Austine Lefort, MD   1 year ago Cellulitis of right leg   [place] [facility] Medicine Pickard, Cisco Crest, MD              Passed - CBC within normal limits and completed in the last 12 months    WBC  Date Value Ref Range Status  12/24/2022 4.6 3.8 - 10.8 Thousand/uL Final   RBC  Date Value Ref Range Status  12/24/2022 4.49 4.20 - 5.80 Million/uL Final   Hemoglobin  Date Value Ref Range Status  12/24/2022 13.7 13.2 - 17.1 g/dL Final   HCT  Date Value Ref Range Status  12/24/2022 41.0 38.5 - 50.0 % Final   MCHC  Date Value Ref Range Status  12/24/2022 33.4 32.0 - 36.0 g/dL Final    Comment:    For adults, a slight decrease in the calculated MCHC value (in the range of 30 to 32 g/dL) is most likely not clinically significant; however, it should be interpreted with caution in correlation with other red cell parameters and the patient's clinical condition.    [facility]  Date Value Ref Range Status  12/24/2022 30.5 27.0 - 33.0 pg Final   MCV  Date Value Ref Range Status  12/24/2022 91.3 80.0 - 100.0 fL Final   No results found for: "PLTCOUNTKUC", "LABPLAT", "POCPLA" RDW  Date Value Ref Range Status  12/24/2022 12.8 11.0 - 15.0 % Final

## 2023-07-18 ENCOUNTER — Encounter: Payer: Self-pay | Admitting: Family Medicine

## 2023-07-18 ENCOUNTER — Ambulatory Visit: Admitting: Family Medicine

## 2023-07-18 VITALS — BP 132/68 | HR 78 | Temp 98.4°F | Ht 73.0 in | Wt 242.8 lb

## 2023-07-18 DIAGNOSIS — K635 Polyp of colon: Secondary | ICD-10-CM | POA: Diagnosis not present

## 2023-07-18 DIAGNOSIS — E118 Type 2 diabetes mellitus with unspecified complications: Secondary | ICD-10-CM | POA: Diagnosis not present

## 2023-07-18 DIAGNOSIS — R3914 Feeling of incomplete bladder emptying: Secondary | ICD-10-CM | POA: Diagnosis not present

## 2023-07-18 DIAGNOSIS — Z125 Encounter for screening for malignant neoplasm of prostate: Secondary | ICD-10-CM

## 2023-07-18 DIAGNOSIS — K760 Fatty (change of) liver, not elsewhere classified: Secondary | ICD-10-CM | POA: Diagnosis not present

## 2023-07-18 DIAGNOSIS — I10 Essential (primary) hypertension: Secondary | ICD-10-CM | POA: Diagnosis not present

## 2023-07-18 DIAGNOSIS — Z7984 Long term (current) use of oral hypoglycemic drugs: Secondary | ICD-10-CM | POA: Diagnosis not present

## 2023-07-18 NOTE — Progress Notes (Signed)
 Subjective:    Patient ID: Joel Reed., male    DOB: 24-Jun-1950, 73 y.o.   MRN: [id number]  HPI Patient is a very pleasant 73 year old Caucasian gentleman who is here today for follow-up of his chronic medical conditions.  He has a history of type 2 diabetes mellitus and nonalcoholic fatty liver disease with cirrhosis.   Wt Readings from Last 3 Encounters:  07/18/23 242 lb 12.8 oz (110.1 kg)  01/15/23 248 lb 12.8 oz (112.9 kg)  12/24/22 246 lb 3.2 oz (111.7 kg)   Thankfully his weight continues to drop.  He recently increased Ozempic  to 2 mg.  He reports occasional diarrhea with pills but otherwise is doing okay.  Blood pressure today is excellent.  Diabetic foot exam was performed today.  He does have skin changes consistent with venous stasis dermatitis however he has excellent pulses in both feet.  He denies any neuropathic pain.  He is due for a colonoscopy.  He is also due for prostate cancer screening.  Past Medical History:  Diagnosis Date   Allergy    seasonal   Celiac disease    Cirrhosis (HCC)    Diabetes mellitus without complication (HCC)    Gastritis and duodenitis    on egd 12/2017   Hyperlipidemia    Patient denies   Hypertension    IDA (iron deficiency anemia)    Internal hemorrhoids    Nephrolithiasis    PVD (peripheral vascular disease) (HCC)    decreased TBI in right foot.   Right buttock pain 07/29/2018   Sleep apnea    no cpap   Splenomegaly    Tubular adenoma of colon    Past Surgical History:  Procedure Laterality Date   COLONOSCOPY     JOINT REPLACEMENT     left hip Dr Jinger [place]   POLYPECTOMY     SPINE SURGERY     disectomy   UPPER GASTROINTESTINAL ENDOSCOPY  12/16/2017   Current Outpatient Medications on File Prior to Visit  Medication Sig Dispense Refill   amLODipine  (NORVASC ) 5 MG tablet Take 1 tablet (5 mg total) by mouth daily. 90 tablet 0   CONTOUR NEXT TEST test strip USE 1 STRIP TO CHECK GLUCOSE ONCE DAILY 100 each 0    cyanocobalamin  (VITAMIN B12) 500 MCG tablet Take 1 tablet (500 mcg total) by mouth daily. 90 tablet 3   fluticasone  (FLONASE ) 50 MCG/ACT nasal spray Place 2 sprays into both nostrils daily. 16 g 2   ketoconazole (NIZORAL) 2 % cream Apply topically 2 (two) times daily.     losartan  (COZAAR ) 100 MG tablet Take 1 tablet (100 mg total) by mouth daily. 90 tablet 1   metFORMIN  (GLUCOPHAGE ) 1000 MG tablet TAKE 1 TABLET BY MOUTH TWICE DAILY WITH A MEAL. APPT REQUIRED FOR FUTURE REFILLS 180 tablet 0   metoprolol  succinate (TOPROL -XL) 25 MG 24 hr tablet Take 1 tablet (25 mg total) by mouth daily. 90 tablet 3   Semaglutide , 1 MG/DOSE, 4 MG/3ML SOPN Inject 1 mg as directed once a week. 3 mL 3   tamsulosin  (FLOMAX ) 0.4 MG CAPS capsule Take 1 capsule (0.4 mg total) by mouth daily. 30 capsule 3   No current facility-administered medications on file prior to visit.   Allergies  Allergen Reactions   Gluten Meal    Social History   Socioeconomic History   Marital status: Married    Spouse name: Not on file   Number of children: Not on file   Years of  education: Not on file   Highest education level: Not on file  Occupational History   Not on file  Tobacco Use   Smoking status: Never   Smokeless tobacco: Never  Vaping Use   Vaping status: Never Used  Substance and Sexual Activity   Alcohol use: No    Comment: quit 1990   Drug use: No   Sexual activity: Yes  Other Topics Concern   Not on file  Social History Narrative   Not on file   Social Drivers of Health   Financial Resource Strain: Low Risk  (03/28/2023)   Overall Financial Resource Strain (CARDIA)    Difficulty of Paying Living Expenses: Not hard at all  Food Insecurity: No Food Insecurity (03/28/2023)   Hunger Vital Sign    Worried About Running Out of Food in the Last Year: Never true    Ran Out of Food in the Last Year: Never true  Transportation Needs: No Transportation Needs (03/28/2023)   PRAPARE - Scientist, research (physical sciences) (Medical): No    Lack of Transportation (Non-Medical): No  Physical Activity: Insufficiently Active (03/28/2023)   Exercise Vital Sign    Days of Exercise per Week: 3 days    Minutes of Exercise per Session: 30 min  Stress: No Stress Concern Present (03/28/2023)   [employer] of Occupational Health - Occupational Stress Questionnaire    Feeling of Stress : Not at all  Social Connections: Moderately Isolated (03/28/2023)   Social Connection and Isolation Panel [NHANES]    Frequency of Communication with Friends and Family: More than three times a week    Frequency of Social Gatherings with Friends and Family: More than three times a week    Attends Religious Services: Never    Database administrator or Organizations: No    Attends Banker Meetings: Never    Marital Status: Married  Catering manager Violence: Not At Risk (03/28/2023)   Humiliation, Afraid, Rape, and Kick questionnaire    Fear of Current or Ex-Partner: No    Emotionally Abused: No    Physically Abused: No    Sexually Abused: No   Family History  Problem Relation Age of Onset   Esophageal cancer Father    Breast cancer Sister        bone cancer   Heart attack Brother 69   Cancer - Other Sister        cervical?   Colon polyps Mother    Colon cancer Neg Hx    Rectal cancer Neg Hx    Stomach cancer Neg Hx    Father died from throat cancer  Review of Systems     Objective:   Physical Exam Vitals reviewed.  Constitutional:      General: He is not in acute distress.    Appearance: He is well-developed. He is not diaphoretic.  HENT:     Head: Normocephalic and atraumatic.     Right Ear: External ear normal.     Left Ear: External ear normal.     Nose: Nose normal.     Mouth/Throat:     Pharynx: No oropharyngeal exudate.  Eyes:     General: No scleral icterus.       Right eye: No discharge.        Left eye: No discharge.     Conjunctiva/sclera: Conjunctivae normal.      Pupils: Pupils are equal, round, and reactive to light.  Neck:  Thyroid: No thyromegaly.     Vascular: No JVD.  Cardiovascular:     Rate and Rhythm: Normal rate and regular rhythm.     Heart sounds: Normal heart sounds. No murmur heard.    No friction rub. No gallop.  Pulmonary:     Effort: Pulmonary effort is normal. No respiratory distress.     Breath sounds: Normal breath sounds. No wheezing or rales.  Chest:     Chest wall: No tenderness.  Abdominal:     General: Bowel sounds are normal. There is no distension.     Palpations: Abdomen is soft. There is no mass.     Tenderness: There is no abdominal tenderness. There is no guarding or rebound.  Musculoskeletal:        General: No tenderness or deformity. Normal range of motion.     Cervical back: Neck supple.  Lymphadenopathy:     Cervical: No cervical adenopathy.  Skin:    General: Skin is warm and dry.     Coloration: Skin is not pale.     Findings: No erythema or rash.  Neurological:     Mental Status: He is alert and oriented to person, place, and time.     Cranial Nerves: No cranial nerve deficit.     Motor: No abnormal muscle tone.     Coordination: Coordination normal.     Deep Tendon Reflexes: Reflexes are normal and symmetric.  Psychiatric:        Behavior: Behavior normal.        Thought Content: Thought content normal.        Judgment: Judgment normal.           Assessment & Plan:  NAFLD (nonalcoholic fatty liver disease) - Plan: CBC with Differential/Platelet, Comprehensive metabolic panel with GFR, Lipid panel, Hemoglobin A1c, Microalbumin/Creatinine Ratio, Urine  Controlled type 2 diabetes mellitus with complication, without long-term current use of insulin  (HCC) - Plan: CBC with Differential/Platelet, Comprehensive metabolic panel with GFR, Lipid panel, Hemoglobin A1c, Microalbumin/Creatinine Ratio, Urine  HTN (hypertension), benign  Prostate cancer screening - Plan: PSA  Feeling of incomplete  bladder emptying - Plan: PSA  Polyp of colon, unspecified part of colon, unspecified type - Plan: Ambulatory referral to Gastroenterology Very happy with his blood pressure.  Check hemoglobin A1c.  Ideally I like to see his A1c less than 6.5.  I will check a fasting lipid panel.  I would like to see his LDL cholesterol less than 454.  I will schedule the patient for a colonoscopy.  I will check a PSA to screen for prostate cancer.  Immunizations are up today.  If liver test are elevated, consider switching Ozempic  to Mounjaro.

## 2023-07-19 ENCOUNTER — Ambulatory Visit: Payer: Self-pay | Admitting: Family Medicine

## 2023-07-19 LAB — CBC WITH DIFFERENTIAL/PLATELET
Absolute Lymphocytes: 1070 {cells}/uL (ref 850–3900)
Absolute Monocytes: 456 {cells}/uL (ref 200–950)
Basophils Absolute: 29 {cells}/uL (ref 0–200)
Basophils Relative: 0.6 %
Eosinophils Absolute: 91 {cells}/uL (ref 15–500)
Eosinophils Relative: 1.9 %
HCT: 41.5 % (ref 38.5–50.0)
Hemoglobin: 14 g/dL (ref 13.2–17.1)
MCH: 30 pg (ref 27.0–33.0)
MCHC: 33.7 g/dL (ref 32.0–36.0)
MCV: 89.1 fL (ref 80.0–100.0)
MPV: 11.6 fL (ref 7.5–12.5)
Monocytes Relative: 9.5 %
Neutro Abs: 3154 {cells}/uL (ref 1500–7800)
Neutrophils Relative %: 65.7 %
Platelets: 111 10*3/uL — ABNORMAL LOW (ref 140–400)
RBC: 4.66 10*6/uL (ref 4.20–5.80)
RDW: 13.4 % (ref 11.0–15.0)
Total Lymphocyte: 22.3 %
WBC: 4.8 10*3/uL (ref 3.8–10.8)

## 2023-07-19 LAB — COMPREHENSIVE METABOLIC PANEL WITH GFR
AG Ratio: 1.7 (calc) (ref 1.0–2.5)
ALT: 28 U/L (ref 9–46)
AST: 22 U/L (ref 10–35)
Albumin: 4.5 g/dL (ref 3.6–5.1)
Alkaline phosphatase (APISO): 48 U/L (ref 35–144)
BUN: 14 mg/dL (ref 7–25)
CO2: 26 mmol/L (ref 20–32)
Calcium: 9 mg/dL (ref 8.6–10.3)
Chloride: 103 mmol/L (ref 98–110)
Creat: 0.96 mg/dL (ref 0.70–1.28)
Globulin: 2.7 g/dL (ref 1.9–3.7)
Glucose, Bld: 120 mg/dL — ABNORMAL HIGH (ref 65–99)
Potassium: 4.2 mmol/L (ref 3.5–5.3)
Sodium: 140 mmol/L (ref 135–146)
Total Bilirubin: 0.9 mg/dL (ref 0.2–1.2)
Total Protein: 7.2 g/dL (ref 6.1–8.1)
eGFR: 85 mL/min/{1.73_m2} (ref >=60)

## 2023-07-19 LAB — PSA: PSA: 0.26 ng/mL (ref <=4.00)

## 2023-07-19 LAB — HEMOGLOBIN A1C
Hgb A1c MFr Bld: 5.5 % (ref <5.7)
Mean Plasma Glucose: 111 mg/dL
eAG (mmol/L): 6.2 mmol/L

## 2023-07-19 LAB — LIPID PANEL
Cholesterol: 160 mg/dL (ref <200)
HDL: 52 mg/dL (ref >=40)
LDL Cholesterol (Calc): 92 mg/dL
Non-HDL Cholesterol (Calc): 108 mg/dL (ref <130)
Total CHOL/HDL Ratio: 3.1 (calc) (ref <5.0)
Triglycerides: 70 mg/dL (ref <150)

## 2023-07-19 LAB — MICROALBUMIN / CREATININE URINE RATIO
Creatinine, Urine: 135 mg/dL (ref 20–320)
Microalb Creat Ratio: 78 mg/g{creat} — ABNORMAL HIGH (ref <30)
Microalb, Ur: 10.5 mg/dL

## 2023-08-19 ENCOUNTER — Other Ambulatory Visit: Payer: Self-pay | Admitting: Family Medicine

## 2023-08-19 DIAGNOSIS — I10 Essential (primary) hypertension: Secondary | ICD-10-CM

## 2023-08-26 ENCOUNTER — Other Ambulatory Visit: Payer: Self-pay | Admitting: Family Medicine

## 2023-08-26 DIAGNOSIS — E118 Type 2 diabetes mellitus with unspecified complications: Secondary | ICD-10-CM

## 2023-09-27 ENCOUNTER — Other Ambulatory Visit: Payer: Self-pay | Admitting: Family Medicine

## 2023-09-27 DIAGNOSIS — E118 Type 2 diabetes mellitus with unspecified complications: Secondary | ICD-10-CM

## 2023-09-30 NOTE — Telephone Encounter (Signed)
 Requested Prescriptions  Pending Prescriptions Disp Refills   metFORMIN  (GLUCOPHAGE ) 1000 MG tablet [Pharmacy Med Name: metFORMIN  HCl 1000 MG Oral Tablet] 180 tablet 1    Sig: TAKE 1 TABLET BY MOUTH TWICE DAILY WITH A MEAL . APPOINTMENT REQUIRED FOR FUTURE REFILLS     Endocrinology:  Diabetes - Biguanides Failed - 09/30/2023  4:24 PM      Failed - B12 Level in normal range and within 720 days    Vitamin B-12  Date Value Ref Range Status  01/21/2018 845 180 - 914 pg/mL Final    Comment:    (NOTE) This assay is not validated for testing neonatal or myeloproliferative syndrome specimens for Vitamin B12 levels. Performed at [facility], [address], [place], [place] [address]          Passed - Cr in normal range and within 360 days    Creat  Date Value Ref Range Status  07/18/2023 0.96 0.70 - 1.28 mg/dL Final   Creatinine, Urine  Date Value Ref Range Status  07/18/2023 135 20 - 320 mg/dL Final         Passed - HBA1C is between 0 and 7.9 and within 180 days    Hgb A1c MFr Bld  Date Value Ref Range Status  07/18/2023 5.5 <5.7 % Final    Comment:    For the purpose of screening for the presence of diabetes: . <5.7%       Consistent with the absence of diabetes 5.7-6.4%    Consistent with increased risk for diabetes             (prediabetes) > or =6.5%  Consistent with diabetes . This assay result is consistent with a decreased risk of diabetes. . Currently, no consensus exists regarding use of hemoglobin A1c for diagnosis of diabetes in children. . According to American Diabetes Association (ADA) guidelines, hemoglobin A1c <7.0% represents optimal control in non-pregnant diabetic patients. Different metrics may apply to specific patient populations.  Standards of Medical Care in Diabetes(ADA). .          Passed - eGFR in normal range and within 360 days    GFR, Est African American  Date Value Ref Range Status  07/11/2018 104 > OR = 60 mL/min/1.40m2 Final    GFR calc Af Amer  Date Value Ref Range Status  03/12/2019 >60 >60 mL/min Final    Comment:    Performed at [facility], [address] [place], [place] [address]   GFR, Est Non African American  Date Value Ref Range Status  07/11/2018 90 > OR = 60 mL/min/1.44m2 Final   GFR calc non Af Amer  Date Value Ref Range Status  03/12/2019 >60 >60 mL/min Final   GFR  Date Value Ref Range Status  01/25/2020 94.23 >60.00 mL/min Final    Comment:    Calculated using the CKD-EPI Creatinine Equation (2021)   eGFR  Date Value Ref Range Status  07/18/2023 85 > OR = 60 mL/min/1.14m2 Final         Passed - Valid encounter within last 6 months    Recent Outpatient Visits           2 months ago NAFLD (nonalcoholic fatty liver disease)   [place] [facility] Duanne Butler DASEN, MD   9 months ago NAFLD (nonalcoholic fatty liver disease)   [place] [facility] Medicine Duanne Butler DASEN, MD   1 year ago NAFLD (nonalcoholic fatty liver disease)  [place] [facility] Pickard, Butler DASEN, MD   1 year ago Right sided sciatica   [place] [facility] Medicine Duanne Butler DASEN, MD   1 year ago Right sided sciatica   [place] [facility] Duanne, Butler DASEN, MD              Passed - CBC within normal limits and completed in the last 12 months    WBC  Date Value Ref Range Status  07/18/2023 4.8 3.8 - 10.8 Thousand/uL Final   RBC  Date Value Ref Range Status  07/18/2023 4.66 4.20 - 5.80 Million/uL Final   Hemoglobin  Date Value Ref Range Status  07/18/2023 14.0 13.2 - 17.1 g/dL Final   HCT  Date Value Ref Range Status  07/18/2023 41.5 38.5 - 50.0 % Final   MCHC  Date Value Ref Range Status  07/18/2023 33.7 32.0 - 36.0 g/dL Final    Comment:    For adults, a slight decrease in the calculated MCHC value (in the range of 30 to 32 g/dL) is most likely not clinically significant; however, it should  be interpreted with caution in correlation with other red cell parameters and the patient's clinical condition.    [facility]  Date Value Ref Range Status  07/18/2023 30.0 27.0 - 33.0 pg Final   MCV  Date Value Ref Range Status  07/18/2023 89.1 80.0 - 100.0 fL Final   No results found for: PLTCOUNTKUC, LABPLAT, POCPLA RDW  Date Value Ref Range Status  07/18/2023 13.4 11.0 - 15.0 % Final

## 2023-10-03 DIAGNOSIS — E119 Type 2 diabetes mellitus without complications: Secondary | ICD-10-CM | POA: Diagnosis not present

## 2023-10-03 DIAGNOSIS — K769 Liver disease, unspecified: Secondary | ICD-10-CM | POA: Diagnosis not present

## 2023-10-03 DIAGNOSIS — K7469 Other cirrhosis of liver: Secondary | ICD-10-CM | POA: Diagnosis not present

## 2023-10-03 DIAGNOSIS — Z860101 Personal history of adenomatous and serrated colon polyps: Secondary | ICD-10-CM | POA: Diagnosis not present

## 2023-10-03 DIAGNOSIS — K3189 Other diseases of stomach and duodenum: Secondary | ICD-10-CM | POA: Diagnosis not present

## 2023-10-03 DIAGNOSIS — Z125 Encounter for screening for malignant neoplasm of prostate: Secondary | ICD-10-CM | POA: Diagnosis not present

## 2023-10-03 DIAGNOSIS — K766 Portal hypertension: Secondary | ICD-10-CM | POA: Diagnosis not present

## 2023-10-03 DIAGNOSIS — K7581 Nonalcoholic steatohepatitis (NASH): Secondary | ICD-10-CM | POA: Diagnosis not present

## 2023-10-03 DIAGNOSIS — R600 Localized edema: Secondary | ICD-10-CM | POA: Diagnosis not present

## 2023-10-03 DIAGNOSIS — R799 Abnormal finding of blood chemistry, unspecified: Secondary | ICD-10-CM | POA: Diagnosis not present

## 2023-10-07 ENCOUNTER — Other Ambulatory Visit: Payer: Self-pay | Admitting: Family Medicine

## 2023-10-07 DIAGNOSIS — E118 Type 2 diabetes mellitus with unspecified complications: Secondary | ICD-10-CM

## 2023-10-08 ENCOUNTER — Telehealth: Payer: Self-pay

## 2023-10-08 NOTE — Telephone Encounter (Signed)
 Completed refill order form for Ozempic  (Novo Nordisk) and faxed to provider for review and signature.

## 2023-10-08 NOTE — Telephone Encounter (Signed)
 08/26/23 #100- too soon Requested Prescriptions  Pending Prescriptions Disp Refills   CONTOUR NEXT TEST test strip [Pharmacy Med Name: Contour Next Test In Vitro Strip] 100 each 0    Sig: USE 1 STRIP TO CHECK GLUCOSE ONCE DAILY     Endocrinology: Diabetes - Testing Supplies Passed - 10/08/2023  4:09 PM      Passed - Valid encounter within last 12 months    Recent Outpatient Visits           2 months ago NAFLD (nonalcoholic fatty liver disease)   [place] [facility] Duanne Butler DASEN, MD   9 months ago NAFLD (nonalcoholic fatty liver disease)   [place] [facility] Duanne Butler DASEN, MD   1 year ago NAFLD (nonalcoholic fatty liver disease)   [place] [facility] Duanne Butler DASEN, MD   1 year ago Right sided sciatica   [place] [facility] Medicine Duanne Butler DASEN, MD   1 year ago Right sided sciatica    [facility] Family Medicine Pickard, Butler DASEN, MD

## 2023-10-14 ENCOUNTER — Other Ambulatory Visit: Payer: Self-pay | Admitting: Family Medicine

## 2023-10-14 DIAGNOSIS — I10 Essential (primary) hypertension: Secondary | ICD-10-CM

## 2023-10-16 DIAGNOSIS — R799 Abnormal finding of blood chemistry, unspecified: Secondary | ICD-10-CM | POA: Diagnosis not present

## 2023-10-16 DIAGNOSIS — E119 Type 2 diabetes mellitus without complications: Secondary | ICD-10-CM | POA: Diagnosis not present

## 2023-10-16 DIAGNOSIS — Z125 Encounter for screening for malignant neoplasm of prostate: Secondary | ICD-10-CM | POA: Diagnosis not present

## 2023-10-16 DIAGNOSIS — K766 Portal hypertension: Secondary | ICD-10-CM | POA: Diagnosis not present

## 2023-10-16 DIAGNOSIS — K7469 Other cirrhosis of liver: Secondary | ICD-10-CM | POA: Diagnosis not present

## 2023-10-16 DIAGNOSIS — E113293 Type 2 diabetes mellitus with mild nonproliferative diabetic retinopathy without macular edema, bilateral: Secondary | ICD-10-CM | POA: Diagnosis not present

## 2023-10-18 ENCOUNTER — Encounter: Payer: Self-pay | Admitting: Family Medicine

## 2023-10-18 ENCOUNTER — Ambulatory Visit: Admitting: Family Medicine

## 2023-10-18 VITALS — BP 120/76 | HR 83 | Temp 98.2°F | Ht 73.0 in | Wt 234.8 lb

## 2023-10-18 DIAGNOSIS — E118 Type 2 diabetes mellitus with unspecified complications: Secondary | ICD-10-CM

## 2023-10-18 DIAGNOSIS — K760 Fatty (change of) liver, not elsewhere classified: Secondary | ICD-10-CM

## 2023-10-18 DIAGNOSIS — Z1211 Encounter for screening for malignant neoplasm of colon: Secondary | ICD-10-CM

## 2023-10-18 DIAGNOSIS — Z7984 Long term (current) use of oral hypoglycemic drugs: Secondary | ICD-10-CM | POA: Diagnosis not present

## 2023-10-18 NOTE — Progress Notes (Signed)
 Subjective:    Patient ID: Joel Reed., male    DOB: 01/07/1951, 73 y.o.   MRN: [id number]  HPI Patient is a very pleasant 73 year old Caucasian gentleman who is here today for follow-up of his chronic medical conditions.  He has a history of type 2 diabetes mellitus and nonalcoholic fatty liver disease with cirrhosis.    Wt Readings from Last 3 Encounters:  10/18/23 234 lb 12.8 oz (106.5 kg)  07/18/23 242 lb 12.8 oz (110.1 kg)  01/15/23 248 lb 12.8 oz (112.9 kg)   Patient is here today for his regular checkup.  He continues to lose weight on Ozempic .  He recently saw his hepatologist.  They ordered a hemoglobin A1c that was well below 6.5.  His LDL cholesterol was mildly elevated at 106.  His TSH and PSA were within normal limits.  His blood pressure today is outstanding.  The patient is doing well with no concerns.  However he is not currently on a statin despite being a diabetic. Past Medical History:  Diagnosis Date   Allergy    seasonal   Celiac disease    Cirrhosis (HCC)    Diabetes mellitus without complication (HCC)    Gastritis and duodenitis    on egd 12/2017   Hyperlipidemia    Patient denies   Hypertension    IDA (iron deficiency anemia)    Internal hemorrhoids    Nephrolithiasis    PVD (peripheral vascular disease) (HCC)    decreased TBI in right foot.   Right buttock pain 07/29/2018   Sleep apnea    no cpap   Splenomegaly    Tubular adenoma of colon    Past Surgical History:  Procedure Laterality Date   COLONOSCOPY     JOINT REPLACEMENT     left hip Dr Jane   POLYPECTOMY     SPINE SURGERY     disectomy   UPPER GASTROINTESTINAL ENDOSCOPY  12/16/2017   Current Outpatient Medications on File Prior to Visit  Medication Sig Dispense Refill   amLODipine  (NORVASC ) 5 MG tablet Take 1 tablet by mouth once daily 90 tablet 0   CONTOUR NEXT TEST test strip USE 1 STRIP TO CHECK GLUCOSE ONCE DAILY 100 each 0   cyanocobalamin  (VITAMIN B12) 500 MCG tablet Take  1 tablet (500 mcg total) by mouth daily. 90 tablet 3   fluticasone  (FLONASE ) 50 MCG/ACT nasal spray Place 2 sprays into both nostrils daily. 16 g 2   ketoconazole (NIZORAL) 2 % cream Apply topically 2 (two) times daily.     losartan  (COZAAR ) 100 MG tablet Take 1 tablet by mouth once daily 90 tablet 0   metFORMIN  (GLUCOPHAGE ) 1000 MG tablet TAKE 1 TABLET BY MOUTH TWICE DAILY WITH A MEAL . APPOINTMENT REQUIRED FOR FUTURE REFILLS 180 tablet 1   metoprolol  succinate (TOPROL -XL) 25 MG 24 hr tablet Take 1 tablet (25 mg total) by mouth daily. 90 tablet 3   Semaglutide , 1 MG/DOSE, 4 MG/3ML SOPN Inject 1 mg as directed once a week. (Patient taking differently: Inject 2 mg as directed once a week.) 3 mL 3   tamsulosin  (FLOMAX ) 0.4 MG CAPS capsule Take 1 capsule (0.4 mg total) by mouth daily. 30 capsule 3   No current facility-administered medications on file prior to visit.   Allergies  Allergen Reactions   Gluten Meal    Social History   Socioeconomic History   Marital status: Married    Spouse name: Not on file   Number of  children: Not on file   Years of education: Not on file   Highest education level: Not on file  Occupational History   Not on file  Tobacco Use   Smoking status: Never   Smokeless tobacco: Never  Vaping Use   Vaping status: Never Used  Substance and Sexual Activity   Alcohol use: No    Comment: quit 1990   Drug use: No   Sexual activity: Yes  Other Topics Concern   Not on file  Social History Narrative   Not on file   Social Drivers of Health   Financial Resource Strain: Low Risk  (03/28/2023)   Overall Financial Resource Strain (CARDIA)    Difficulty of Paying Living Expenses: Not hard at all  Food Insecurity: No Food Insecurity (03/28/2023)   Hunger Vital Sign    Worried About Running Out of Food in the Last Year: Never true    Ran Out of Food in the Last Year: Never true  Transportation Needs: No Transportation Needs (03/28/2023)   PRAPARE - Therapist, art (Medical): No    Lack of Transportation (Non-Medical): No  Physical Activity: Insufficiently Active (03/28/2023)   Exercise Vital Sign    Days of Exercise per Week: 3 days    Minutes of Exercise per Session: 30 min  Stress: No Stress Concern Present (03/28/2023)   [employer] of Occupational Health - Occupational Stress Questionnaire    Feeling of Stress : Not at all  Social Connections: Moderately Isolated (03/28/2023)   Social Connection and Isolation Panel    Frequency of Communication with Friends and Family: More than three times a week    Frequency of Social Gatherings with Friends and Family: More than three times a week    Attends Religious Services: Never    Database administrator or Organizations: No    Attends Banker Meetings: Never    Marital Status: Married  Catering manager Violence: Not At Risk (03/28/2023)   Humiliation, Afraid, Rape, and Kick questionnaire    Fear of Current or Ex-Partner: No    Emotionally Abused: No    Physically Abused: No    Sexually Abused: No   Family History  Problem Relation Age of Onset   Esophageal cancer Father    Breast cancer Sister        bone cancer   Heart attack Brother 75   Cancer - Other Sister        cervical?   Colon polyps Mother    Colon cancer Neg Hx    Rectal cancer Neg Hx    Stomach cancer Neg Hx    Father died from throat cancer  Review of Systems     Objective:   Physical Exam Vitals reviewed.  Constitutional:      General: He is not in acute distress.    Appearance: He is well-developed. He is not diaphoretic.  HENT:     Head: Normocephalic and atraumatic.     Right Ear: External ear normal.     Left Ear: External ear normal.     Nose: Nose normal.     Mouth/Throat:     Pharynx: No oropharyngeal exudate.  Eyes:     General: No scleral icterus.       Right eye: No discharge.        Left eye: No discharge.     Conjunctiva/sclera: Conjunctivae normal.      Pupils: Pupils are equal, round, and reactive to  light.  Neck:     Thyroid: No thyromegaly.     Vascular: No JVD.  Cardiovascular:     Rate and Rhythm: Normal rate and regular rhythm.     Heart sounds: Normal heart sounds. No murmur heard.    No friction rub. No gallop.  Pulmonary:     Effort: Pulmonary effort is normal. No respiratory distress.     Breath sounds: Normal breath sounds. No wheezing or rales.  Chest:     Chest wall: No tenderness.  Abdominal:     General: Bowel sounds are normal. There is no distension.     Palpations: Abdomen is soft. There is no mass.     Tenderness: There is no abdominal tenderness. There is no guarding or rebound.  Musculoskeletal:        General: No tenderness or deformity. Normal range of motion.     Cervical back: Neck supple.  Lymphadenopathy:     Cervical: No cervical adenopathy.  Skin:    General: Skin is warm and dry.     Coloration: Skin is not pale.     Findings: No erythema or rash.  Neurological:     Mental Status: He is alert and oriented to person, place, and time.     Cranial Nerves: No cranial nerve deficit.     Motor: No abnormal muscle tone.     Coordination: Coordination normal.     Deep Tendon Reflexes: Reflexes are normal and symmetric.  Psychiatric:        Behavior: Behavior normal.        Thought Content: Thought content normal.        Judgment: Judgment normal.           Assessment & Plan:  Controlled type 2 diabetes mellitus with complication, without long-term current use of insulin  (HCC) - Plan: Hemoglobin A1c, CBC with Differential/Platelet, Comprehensive metabolic panel with GFR, Lipid panel, Microalbumin/Creatinine Ratio, Urine, CT CARDIAC SCORING (SELF PAY ONLY)  NAFLD (nonalcoholic fatty liver disease)  Colon cancer screening - Plan: Ambulatory referral to Gastroenterology Most recent blood work was outstanding other than his low platelet count related to his cirrhosis.  I have recommended checking a  coronary artery calcium score.  If significantly elevated I believe the benefit of taking a statin outweighs the risk to his liver and I would start the statin.  If not significantly elevated I would leave the patient off statin.  Blood pressure and A1c are excellent.  Refer the patient for his colonoscopy.

## 2023-10-31 ENCOUNTER — Encounter: Payer: Self-pay | Admitting: Gastroenterology

## 2023-11-06 ENCOUNTER — Telehealth: Payer: Self-pay

## 2023-11-06 ENCOUNTER — Other Ambulatory Visit: Payer: Self-pay

## 2023-11-06 NOTE — Telephone Encounter (Signed)
 Fax received from [facility] requesting pt be started on cholesterol medication. Fax placed in green folder. Mjp,lpn

## 2023-11-11 ENCOUNTER — Other Ambulatory Visit: Payer: Self-pay | Admitting: Family Medicine

## 2023-11-11 DIAGNOSIS — I10 Essential (primary) hypertension: Secondary | ICD-10-CM

## 2023-11-13 ENCOUNTER — Telehealth: Payer: Self-pay

## 2023-11-13 NOTE — Telephone Encounter (Signed)
 Requested Prescriptions  Pending Prescriptions Disp Refills   amLODipine  (NORVASC ) 5 MG tablet [Pharmacy Med Name: amLODIPine  Besylate 5 MG Oral Tablet] 90 tablet 0    Sig: Take 1 tablet by mouth once daily     Cardiovascular: Calcium Channel Blockers 2 Passed - 11/13/2023  9:15 AM      Passed - Last BP in normal range    BP Readings from Last 1 Encounters:  10/18/23 120/76         Passed - Last Heart Rate in normal range    Pulse Readings from Last 1 Encounters:  10/18/23 83         Passed - Valid encounter within last 6 months    Recent Outpatient Visits           3 weeks ago Controlled type 2 diabetes mellitus with complication, without long-term current use of insulin  [facility])   [place] [facility] Duanne Butler DASEN, MD   3 months ago NAFLD (nonalcoholic fatty liver disease)   [place] [facility] Duanne Butler DASEN, MD   10 months ago NAFLD (nonalcoholic fatty liver disease)   [place] [facility] Duanne Butler DASEN, MD   1 year ago NAFLD (nonalcoholic fatty liver disease)   [place] [facility] Pickard, Butler DASEN, MD   1 year ago Right sided sciatica   [place] [facility] Pickard, Butler DASEN, MD

## 2023-11-13 NOTE — Telephone Encounter (Signed)
 Incoming fax from [employer] for pt assistance program, reorder of Ozempic . Placed in green folder for to be signed. Thank you.

## 2023-11-19 ENCOUNTER — Ambulatory Visit (HOSPITAL_COMMUNITY)
Admission: RE | Admit: 2023-11-19 | Discharge: 2023-11-19 | Disposition: A | Discharge status: Home / Self Care | Payer: Self-pay | Source: Ambulatory Visit | Attending: Family Medicine | Admitting: Family Medicine

## 2023-11-19 DIAGNOSIS — E118 Type 2 diabetes mellitus with unspecified complications: Secondary | ICD-10-CM | POA: Insufficient documentation

## 2023-11-21 ENCOUNTER — Other Ambulatory Visit: Payer: Self-pay

## 2023-11-21 ENCOUNTER — Ambulatory Visit: Payer: Self-pay | Admitting: Family Medicine

## 2023-11-21 DIAGNOSIS — E785 Hyperlipidemia, unspecified: Secondary | ICD-10-CM

## 2023-11-21 DIAGNOSIS — K760 Fatty (change of) liver, not elsewhere classified: Secondary | ICD-10-CM

## 2023-11-21 DIAGNOSIS — E118 Type 2 diabetes mellitus with unspecified complications: Secondary | ICD-10-CM

## 2023-11-21 DIAGNOSIS — R931 Abnormal findings on diagnostic imaging of heart and coronary circulation: Secondary | ICD-10-CM

## 2023-11-21 MED ORDER — ASPIRIN 81 MG PO TBEC: 81.0000 mg | DELAYED_RELEASE_TABLET | Freq: Every day | ORAL | 1 refills | Status: AC

## 2023-11-21 MED ORDER — ROSUVASTATIN CALCIUM 20 MG PO TABS: 20.0000 mg | ORAL_TABLET | Freq: Every day | ORAL | 1 refills | Status: DC

## 2023-12-24 ENCOUNTER — Ambulatory Visit (INDEPENDENT_AMBULATORY_CARE_PROVIDER_SITE_OTHER): Admitting: Gastroenterology

## 2023-12-24 ENCOUNTER — Encounter: Payer: Self-pay | Admitting: Gastroenterology

## 2023-12-24 VITALS — BP 140/70 | HR 84 | Ht 68.5 in | Wt 244.2 lb

## 2023-12-24 DIAGNOSIS — K766 Portal hypertension: Secondary | ICD-10-CM

## 2023-12-24 DIAGNOSIS — Z860100 Personal history of colon polyps, unspecified: Secondary | ICD-10-CM | POA: Diagnosis not present

## 2023-12-24 DIAGNOSIS — K7460 Unspecified cirrhosis of liver: Secondary | ICD-10-CM | POA: Diagnosis not present

## 2023-12-24 MED ORDER — NA SULFATE-K SULFATE-MG SULF 17.5-3.13-1.6 GM/177ML PO SOLN: 1.0000 | Freq: Once | ORAL | 0 refills | Status: AC

## 2023-12-24 NOTE — Patient Instructions (Signed)
 You have been scheduled for a colonoscopy. Please follow written instructions given to you at your visit today.   If you use inhalers (even only as needed), please bring them with you on the day of your procedure.  DO NOT TAKE 7 DAYS PRIOR TO TEST- Trulicity  (dulaglutide ) Ozempic , Wegovy  (semaglutide ) Mounjaro, Zepbound (tirzepatide) Bydureon Bcise (exanatide extended release)  DO NOT TAKE 1 DAY PRIOR TO YOUR TEST Rybelsus  (semaglutide ) Adlyxin (lixisenatide) Victoza (liraglutide) Byetta (exanatide) ___________________________________________________________________________  _______________________________________________________  If your blood pressure at your visit was 140/90 or greater, please contact your primary care physician to follow up on this.  _______________________________________________________  If you are age 34 or older, your body mass index should be between 23-30. Your Body mass index is 36.6 kg/m. If this is out of the aforementioned range listed, please consider follow up with your Primary Care Provider.  If you are age 63 or younger, your body mass index should be between 19-25. Your Body mass index is 36.6 kg/m. If this is out of the aformentioned range listed, please consider follow up with your Primary Care Provider.   ________________________________________________________  The [place] [facility] providers would like to encourage you to use MYCHART to communicate with providers for non-urgent requests or questions.  Due to long hold times on the telephone, sending your provider a message by [facility] may be a faster and more efficient way to get a response.  Please allow 48 business hours for a response.  Please remember that this is for non-urgent requests.  _______________________________________________________  [facility] is using a team-based approach to care.  Your team is made up of your doctor and two to three APPS. Our APPS (Nurse  Practitioners and Physician Assistants) work with your physician to ensure care continuity for you. They are fully qualified to address your health concerns and develop a treatment plan. They communicate directly with your gastroenterologist to care for you. Seeing the Advanced Practice Practitioners on your physician's team can help you by facilitating care more promptly, often allowing for earlier appointments, access to diagnostic testing, procedures, and other specialty referrals.

## 2023-12-24 NOTE — Progress Notes (Signed)
 Chief Complaint: recall colonoscopy Primary GI MD: Dr. Albertus  HPI: 73 year old male with a history of nonalcoholic fatty liver disease cirrhosis with splenomegaly and thrombocytopenia (following with [facility] hepatology), celiac disease, iron deficiency anemia, small colonic angioectasias, history of colon polyps, history of DVT, hypertension, hyperlipidemia and diabetes who is here for follow-up colonoscopy   Discussed the use of AI scribe software for clinical note transcription with the patient, who gave verbal consent to proceed.  He has had colonoscopies in the past, with the last one performed in 2019, which revealed a 2 mm precancerous polyp. He was advised to have a follow-up colonoscopy within five years, but guidelines have since changed to seven years.  He follows with a specialist for liver monitoring due to a liver lesion and undergoes an MRI every six months. He is on carvedilol.  He reports no issues with the colonoscopy procedure or the preparatory drink. He is on aspirin and a cholesterol medication. No history of ascites, confusion, or jaundice.  He has not experienced any heart attacks or strokes this year and is not on blood thinners.  He follows [facility] every 6 months and is up-to-date on [facility] screening.  He is on carvedilol.  And remains well compensated.   Past Medical History:  Diagnosis Date   Allergy    seasonal   Celiac disease    Cirrhosis (HCC)    Diabetes mellitus without complication (HCC)    Gastritis and duodenitis    on egd 12/2017   Hyperlipidemia    Patient denies   Hypertension    IDA (iron deficiency anemia)    Internal hemorrhoids    Nephrolithiasis    PVD (peripheral vascular disease)    decreased TBI in right foot.   Right buttock pain 07/29/2018   Sleep apnea    no cpap   Splenomegaly    Tubular adenoma of colon     Past Surgical History:  Procedure Laterality Date   COLONOSCOPY     JOINT REPLACEMENT  Left    Dr Jane   POLYPECTOMY     SPINE SURGERY     disectomy   UPPER GASTROINTESTINAL ENDOSCOPY  12/16/2017    Current Outpatient Medications  Medication Sig Dispense Refill   amLODipine  (NORVASC ) 5 MG tablet Take 1 tablet by mouth once daily 90 tablet 0   aspirin EC 81 MG tablet Take 1 tablet (81 mg total) by mouth daily. Swallow whole. 90 tablet 1   carvedilol (COREG) 6.25 MG tablet Take 6.25 mg by mouth 2 (two) times daily.     CONTOUR NEXT TEST test strip USE 1 STRIP TO CHECK GLUCOSE ONCE DAILY 100 each 0   cyanocobalamin  (VITAMIN B12) 500 MCG tablet Take 1 tablet (500 mcg total) by mouth daily. 90 tablet 3   fluticasone  (FLONASE ) 50 MCG/ACT nasal spray Place 2 sprays into both nostrils daily. (Patient taking differently: Place 2 sprays into both nostrils as needed.) 16 g 2   ketoconazole (NIZORAL) 2 % cream Apply topically 2 (two) times daily. (Patient taking differently: Apply topically as needed.)     losartan  (COZAAR ) 100 MG tablet Take 1 tablet by mouth once daily 90 tablet 0   metFORMIN  (GLUCOPHAGE ) 1000 MG tablet TAKE 1 TABLET BY MOUTH TWICE DAILY WITH A MEAL . APPOINTMENT REQUIRED FOR FUTURE REFILLS 180 tablet 1   rosuvastatin (CRESTOR) 20 MG tablet Take 1 tablet (20 mg total) by mouth daily. 30 tablet 1   Semaglutide , 1 MG/DOSE,  4 MG/3ML SOPN Inject 1 mg as directed once a week. (Patient taking differently: Inject 2 mg as directed once a week.) 3 mL 3   tamsulosin  (FLOMAX ) 0.4 MG CAPS capsule Take 1 capsule (0.4 mg total) by mouth daily. (Patient taking differently: Take 0.4 mg by mouth as needed.) 30 capsule 3   No current facility-administered medications for this visit.    Allergies as of 12/24/2023 - Review Complete 12/24/2023  Allergen Reaction Noted   Gluten meal  03/12/2018    Family History  Problem Relation Age of Onset   Esophageal cancer Father    Breast cancer Sister        bone cancer   Heart attack Brother 34   Cancer - Other Sister        cervical?    Colon polyps Mother    Colon cancer Neg Hx    Rectal cancer Neg Hx    Stomach cancer Neg Hx     Social History   Socioeconomic History   Marital status: Married    Spouse name: Not on file   Number of children: Not on file   Years of education: Not on file   Highest education level: Not on file  Occupational History   Not on file  Tobacco Use   Smoking status: Never   Smokeless tobacco: Never  Vaping Use   Vaping status: Never Used  Substance and Sexual Activity   Alcohol use: No    Comment: quit 1990   Drug use: No   Sexual activity: Yes  Other Topics Concern   Not on file  Social History Narrative   Not on file   Social Drivers of Health   Financial Resource Strain: Low Risk  (03/28/2023)   Overall Financial Resource Strain (CARDIA)    Difficulty of Paying Living Expenses: Not hard at all  Food Insecurity: No Food Insecurity (03/28/2023)   Hunger Vital Sign    Worried About Running Out of Food in the Last Year: Never true    Ran Out of Food in the Last Year: Never true  Transportation Needs: No Transportation Needs (03/28/2023)   PRAPARE - Administrator, Civil Service (Medical): No    Lack of Transportation (Non-Medical): No  Physical Activity: Insufficiently Active (03/28/2023)   Exercise Vital Sign    Days of Exercise per Week: 3 days    Minutes of Exercise per Session: 30 min  Stress: No Stress Concern Present (03/28/2023)   [employer] of Occupational Health - Occupational Stress Questionnaire    Feeling of Stress : Not at all  Social Connections: Moderately Isolated (03/28/2023)   Social Connection and Isolation Panel    Frequency of Communication with Friends and Family: More than three times a week    Frequency of Social Gatherings with Friends and Family: More than three times a week    Attends Religious Services: Never    Database Administrator or Organizations: No    Attends Banker Meetings: Never    Marital Status:  Married  Catering Manager Violence: Not At Risk (03/28/2023)   Humiliation, Afraid, Rape, and Kick questionnaire    Fear of Current or Ex-Partner: No    Emotionally Abused: No    Physically Abused: No    Sexually Abused: No    Review of Systems:    Constitutional: No weight loss, fever, chills, weakness or fatigue HEENT: Eyes: No change in vision  Ears, Nose, Throat:  No change in hearing or congestion Skin: No rash or itching Cardiovascular: No chest pain, chest pressure or palpitations   Respiratory: No SOB or cough Gastrointestinal: See HPI and otherwise negative Genitourinary: No dysuria or change in urinary frequency Neurological: No headache, dizziness or syncope Musculoskeletal: No new muscle or joint pain Hematologic: No bleeding or bruising Psychiatric: No history of depression or anxiety    Physical Exam:  Vital signs: Ht 5' 8.5 (1.74 m) Comment: height measured without shoes  Wt 244 lb 4 oz (110.8 kg)   BMI 36.60 kg/m   Constitutional: NAD, alert and cooperative Head:  Normocephalic and atraumatic. Eyes:   PEERL, EOMI. No icterus. Conjunctiva pink. Respiratory: Respirations even and unlabored. Lungs clear to auscultation bilaterally.   No wheezes, crackles, or rhonchi.  Cardiovascular:  Regular rate and rhythm. No peripheral edema, cyanosis or pallor.  Gastrointestinal:  Soft, nondistended, nontender. No rebound or guarding. Normal bowel sounds. No appreciable masses or hepatomegaly. Rectal:  Declines Msk:  Symmetrical without gross deformities. Without edema, no deformity or joint abnormality.  Neurologic:  Alert and  oriented x4;  grossly normal neurologically.  Skin:   Dry and intact without significant lesions or rashes. Psychiatric: Oriented to person, place and time. Demonstrates good judgement and reason without abnormal affect or behaviors.  RELEVANT LABS AND IMAGING: CBC    Component Value Date/Time   WBC 4.8 07/18/2023 0857   RBC 4.66  07/18/2023 0857   HGB 14.0 07/18/2023 0857   HCT 41.5 07/18/2023 0857   PLT 111 (L) 07/18/2023 0857   MCV 89.1 07/18/2023 0857   MCH 30.0 07/18/2023 0857   MCHC 33.7 07/18/2023 0857   RDW 13.4 07/18/2023 0857   LYMPHSABS 999 06/21/2022 0825   MONOABS 0.5 01/25/2020 1522   EOSABS 91 07/18/2023 0857   BASOSABS 29 07/18/2023 0857    CMP     Component Value Date/Time   NA 140 07/18/2023 0857   K 4.2 07/18/2023 0857   CL 103 07/18/2023 0857   CO2 26 07/18/2023 0857   GLUCOSE 120 (H) 07/18/2023 0857   BUN 14 07/18/2023 0857   CREATININE 0.96 07/18/2023 0857   CALCIUM 9.0 07/18/2023 0857   PROT 7.2 07/18/2023 0857   ALBUMIN 4.2 01/25/2020 1522   AST 22 07/18/2023 0857   ALT 28 07/18/2023 0857   ALKPHOS 59 01/25/2020 1522   BILITOT 0.9 07/18/2023 0857   GFRNONAA >60 03/12/2019 0829   GFRNONAA 90 07/11/2018 1145   GFRAA >60 03/12/2019 0829   GFRAA 104 07/11/2018 1145     Assessment/Plan:   73 year old male with a history of nonalcoholic fatty liver disease cirrhosis with splenomegaly and thrombocytopenia (following with [facility] hepatology), celiac disease, iron deficiency anemia, small colonic angioectasias, history of colon polyps, history of DVT, hypertension, hyperlipidemia and diabetes who is here for follow-up colonoscopy   History of colon polyps Colonoscopy 2019 with 2 mm tubular adenoma.  No family history of colon cancer.  Based on updated guidelines patient would be due in 2026.  However, patient would like to go ahead and pursue based on the 5-year recall. - Schedule colonoscopy - I thoroughly discussed the procedure with the patient (at bedside) to include nature of the procedure, alternatives, benefits, and risks (including but not limited to bleeding, infection, perforation, anesthesia/cardiac pulmonary complications).  Patient verbalized understanding and gave verbal consent to proceed with procedure.  MASH cirrhosis Liver lesion Portal hypertension Well  compensated.  Portal hypertension.  Follows with [facility] hepatology  every 6 months and is up-to-date with his HCC screening with recent MRI showing stable liver lesion.  He also is on carvedilol.  Last EGD in 2022 without varices. - Continue to follow with [facility] hepatology - No variceal screening required as long as he remains on carvedilol  IDA History of celiac disease Recent CBC with Hgb 14.5  Nestor Blower, PA-C [facility] 12/24/2023, 8:53 AM  Cc: Duanne Butler DASEN, MD

## 2024-01-03 ENCOUNTER — Other Ambulatory Visit: Payer: Self-pay | Admitting: Family Medicine

## 2024-01-03 DIAGNOSIS — E118 Type 2 diabetes mellitus with unspecified complications: Secondary | ICD-10-CM

## 2024-01-20 ENCOUNTER — Telehealth: Payer: Self-pay

## 2024-01-20 ENCOUNTER — Other Ambulatory Visit: Payer: Self-pay | Admitting: Family Medicine

## 2024-01-20 ENCOUNTER — Other Ambulatory Visit: Payer: Self-pay

## 2024-01-20 DIAGNOSIS — E785 Hyperlipidemia, unspecified: Secondary | ICD-10-CM

## 2024-01-20 DIAGNOSIS — E118 Type 2 diabetes mellitus with unspecified complications: Secondary | ICD-10-CM

## 2024-01-20 DIAGNOSIS — K760 Fatty (change of) liver, not elsewhere classified: Secondary | ICD-10-CM

## 2024-01-20 DIAGNOSIS — I10 Essential (primary) hypertension: Secondary | ICD-10-CM

## 2024-01-20 MED ORDER — CARVEDILOL 6.25 MG PO TABS: 6.2500 mg | ORAL_TABLET | Freq: Two times a day (BID) | ORAL | 1 refills | Status: AC

## 2024-01-20 MED ORDER — ROSUVASTATIN CALCIUM 20 MG PO TABS: 20.0000 mg | ORAL_TABLET | Freq: Every day | ORAL | 1 refills | Status: AC

## 2024-01-20 NOTE — Telephone Encounter (Signed)
 Copied from CRM #[id number]. Topic: Clinical - Medication Question >> Jan 20, 2024 12:34 PM Fonda T wrote: Reason for CRM: Pt calling requesting to speak to [facility] in office, regarding questions he has on newly prescribed medication, rosuvastatin  (CRESTOR ) 20 MG tablet.  Pt requesting a return call to [phone]  Aware of same day call back.

## 2024-02-03 ENCOUNTER — Other Ambulatory Visit: Payer: Self-pay | Admitting: Family Medicine

## 2024-02-03 DIAGNOSIS — I10 Essential (primary) hypertension: Secondary | ICD-10-CM

## 2024-02-10 ENCOUNTER — Other Ambulatory Visit: Payer: Self-pay | Admitting: Family Medicine

## 2024-02-10 DIAGNOSIS — I10 Essential (primary) hypertension: Secondary | ICD-10-CM

## 2024-02-17 ENCOUNTER — Telehealth: Payer: Self-pay | Admitting: Internal Medicine

## 2024-02-17 NOTE — Telephone Encounter (Signed)
 PT is calling to further discuss prep instructions. He was unaware of the 5 days before prep. Please advise.

## 2024-02-17 NOTE — Telephone Encounter (Signed)
 Spoke with pt and he states it has already been taken care of, his procedure has been rescheduled to 02/24/24.

## 2024-02-18 ENCOUNTER — Encounter: Admitting: Internal Medicine

## 2024-02-21 ENCOUNTER — Ambulatory Visit: Attending: Cardiology | Admitting: Cardiology

## 2024-02-21 ENCOUNTER — Encounter: Payer: Self-pay | Admitting: Cardiology

## 2024-02-21 VITALS — BP 122/74 | HR 74 | Ht 68.0 in | Wt 239.0 lb

## 2024-02-21 DIAGNOSIS — I440 Atrioventricular block, first degree: Secondary | ICD-10-CM | POA: Diagnosis present

## 2024-02-21 DIAGNOSIS — I2510 Atherosclerotic heart disease of native coronary artery without angina pectoris: Secondary | ICD-10-CM | POA: Diagnosis not present

## 2024-02-21 DIAGNOSIS — I10 Essential (primary) hypertension: Secondary | ICD-10-CM | POA: Insufficient documentation

## 2024-02-21 NOTE — Patient Instructions (Signed)
 Medication Instructions:  Your physician recommends that you continue on your current medications as directed. Please refer to the Current Medication list given to you today.  *If you need a refill on your cardiac medications before your next appointment, please call your pharmacy*  Lab Work: None If you have labs (blood work) drawn today and your tests are completely normal, you will receive your results only by: MyChart Message (if you have MyChart) OR A paper copy in the mail If you have any lab test that is abnormal or we need to change your treatment, we will call you to review the results.  Testing/Procedures: None  Follow-Up: At [facility], you and your health needs are our priority.  As part of our continuing mission to provide you with exceptional heart care, our providers are all part of one team.  This team includes your primary Cardiologist (physician) and Advanced Practice Providers or APPs (Physician Assistants and Nurse Practitioners) who all work together to provide you with the care you need, when you need it.  Your next appointment:   6 month(s)  Provider:   You may see Dorn Ross, MD or one of the following Advanced Practice Providers on your designated Care Team:   Laymon Qua, PA-C  Scotesia Gurdon, [place] Olivia Pavy, [place]     We recommend signing up for the patient portal called MyChart.  Sign up information is provided on this After Visit Summary.  MyChart is used to connect with patients for Virtual Visits (Telemedicine).  Patients are able to view lab/test results, encounter notes, upcoming appointments, etc.  Non-urgent messages can be sent to your provider as well.   To learn more about what you can do with MyChart, go to [email or web address].   Other Instructions Thank you for choosing [facility]!

## 2024-02-21 NOTE — Progress Notes (Signed)
 "     Clinical Summary [name] is a 73 y.o.male  1.Coronary atherosclerosis - 11/2023 coronary calcium  CT: score 1634, 91st perecentile  - started on ASA, statin per pcp - no chest pains, no SOB/DOE - does heavy yardwork, weed eating up 45 minutes without exertional symptoms   2.HLD - 10/2023 TC 171 HDL 52 TG 81 LDL 106 - just started on crestor  20mg  by pcp   3.DM2 10/2023 HgbA1c 5.1  4. First degree AV block - he is on beta blocker for varices prevention given his cirrhosis.  - no symptoms  5. Cirrhosis - follwoed in [place]   Past Medical History:  Diagnosis Date   Allergy    seasonal   Celiac disease    Cirrhosis (HCC)    Diabetes mellitus without complication (HCC)    Gastritis and duodenitis    on egd 12/2017   Hyperlipidemia    Patient denies   Hypertension    IDA (iron deficiency anemia)    Internal hemorrhoids    Nephrolithiasis    PVD (peripheral vascular disease)    decreased TBI in right foot.   Right buttock pain 07/29/2018   Sleep apnea    no cpap   Splenomegaly    Tubular adenoma of colon      Allergies[1]   Current Outpatient Medications  Medication Sig Dispense Refill   amLODipine  (NORVASC ) 5 MG tablet Take 1 tablet by mouth once daily 90 tablet 0   aspirin  EC 81 MG tablet Take 1 tablet (81 mg total) by mouth daily. Swallow whole. 90 tablet 1   carvedilol  (COREG ) 6.25 MG tablet Take 1 tablet (6.25 mg total) by mouth 2 (two) times daily. 180 tablet 1   CONTOUR NEXT TEST test strip USE 1 STRIP TO CHECK GLUCOSE ONCE DAILY 100 each 0   cyanocobalamin  (VITAMIN B12) 500 MCG tablet Take 1 tablet (500 mcg total) by mouth daily. 90 tablet 3   fluticasone  (FLONASE ) 50 MCG/ACT nasal spray Place 2 sprays into both nostrils daily. (Patient taking differently: Place 2 sprays into both nostrils as needed.) 16 g 2   ketoconazole (NIZORAL) 2 % cream Apply topically 2 (two) times daily. (Patient taking differently: Apply topically as needed.)      losartan  (COZAAR ) 100 MG tablet Take 1 tablet by mouth once daily 90 tablet 0   metFORMIN  (GLUCOPHAGE ) 1000 MG tablet TAKE 1 TABLET BY MOUTH TWICE DAILY WITH A MEAL . APPOINTMENT REQUIRED FOR FUTURE REFILLS 180 tablet 1   rosuvastatin  (CRESTOR ) 20 MG tablet Take 1 tablet (20 mg total) by mouth daily. 90 tablet 1   Semaglutide , 1 MG/DOSE, 4 MG/3ML SOPN Inject 1 mg as directed once a week. (Patient taking differently: Inject 2 mg as directed once a week.) 3 mL 3   tamsulosin  (FLOMAX ) 0.4 MG CAPS capsule Take 1 capsule (0.4 mg total) by mouth daily. (Patient taking differently: Take 0.4 mg by mouth as needed.) 30 capsule 3   No current facility-administered medications for this visit.     Past Surgical History:  Procedure Laterality Date   COLONOSCOPY     JOINT REPLACEMENT Left    Dr Jane   POLYPECTOMY     SPINE SURGERY     disectomy   UPPER GASTROINTESTINAL ENDOSCOPY  12/16/2017     Allergies[2]    Family History  Problem Relation Age of Onset   Colon polyps Mother    Cerebral aneurysm Mother    Esophageal cancer Father    Breast  cancer Sister    Bone cancer Sister    Cancer - Other Sister        cervical?   Cerebral aneurysm Sister    Heart attack Brother 21   Colon cancer Neg Hx    Rectal cancer Neg Hx    Stomach cancer Neg Hx      Social History [name] reports that he has never smoked. He has never used smokeless tobacco. [name] reports no history of alcohol use.    Physical Examination Today's Vitals   02/21/24 0900  BP: 122/74  Pulse: 74  SpO2: 97%  Weight: 239 lb (108.4 kg)  Height: 5' 8 (1.727 m)   Body mass index is 36.34 kg/m.  Gen: resting comfortably, no acute distress HEENT: no scleral icterus, pupils equal round and reactive, no palptable cervical adenopathy,  CV: RRR, no mrg, no jvd Resp: Clear to auscultation bilaterally GI: abdomen is soft, non-tender, non-distended, normal bowel sounds, no hepatosplenomegaly MSK: extremities are  warm, no edema.  Skin: warm, no rash Neuro:  no focal deficits Psych: appropriate affect     Assessment and Plan  1.Coronary atherosclerosis - elevated coronary calcium  score - has been started on ASA and statin by pcp which is appropriate. He will check with his liver team at [facility] to make sure ok to be on both medications - in absence of cardiopulmonary symptoms no indication for stress testing at this time. COntinue aggressive risk factor modification  2. HLD - goal LDL would be <70, just started on crestor  20mg  daily - follow lipids  3. HTN - at goal, continue current meds  4. First degree av block - no symptoms - likely secondary to his coreg  which he is on given his cirrhosis to prevent varices.  - in absence of symptoms with normal heart rates reasonable to continue beta blocker at current dose.       Dorn PHEBE Ross, M.D.     [1]  Allergies Allergen Reactions   Gluten Meal   [2]  Allergies Allergen Reactions   Gluten Meal    "

## 2024-02-24 ENCOUNTER — Ambulatory Visit: Admitting: Internal Medicine

## 2024-02-24 ENCOUNTER — Encounter: Payer: Self-pay | Admitting: Internal Medicine

## 2024-02-24 VITALS — BP 121/72 | HR 68 | Temp 97.4°F | Resp 13 | Ht 68.0 in | Wt 244.0 lb

## 2024-02-24 DIAGNOSIS — D122 Benign neoplasm of ascending colon: Secondary | ICD-10-CM

## 2024-02-24 DIAGNOSIS — K5520 Angiodysplasia of colon without hemorrhage: Secondary | ICD-10-CM

## 2024-02-24 DIAGNOSIS — Z1211 Encounter for screening for malignant neoplasm of colon: Secondary | ICD-10-CM | POA: Diagnosis not present

## 2024-02-24 DIAGNOSIS — I868 Varicose veins of other specified sites: Secondary | ICD-10-CM | POA: Diagnosis not present

## 2024-02-24 DIAGNOSIS — Z860101 Personal history of adenomatous and serrated colon polyps: Secondary | ICD-10-CM | POA: Diagnosis not present

## 2024-02-24 DIAGNOSIS — Z860100 Personal history of colon polyps, unspecified: Secondary | ICD-10-CM

## 2024-02-24 MED ORDER — SODIUM CHLORIDE 0.9 % IV SOLN: 500.0000 mL | Freq: Once | INTRAVENOUS | Status: DC

## 2024-02-24 NOTE — Progress Notes (Signed)
 Pt's states no medical or surgical changes since previsit or office visit.

## 2024-02-24 NOTE — Patient Instructions (Signed)

## 2024-02-24 NOTE — Progress Notes (Signed)
 Called to room to assist during endoscopic procedure.  Patient ID and intended procedure confirmed with present staff. Received instructions for my participation in the procedure from the performing physician.

## 2024-02-24 NOTE — Progress Notes (Signed)
 Transferred to PACU via stretcher.  Not responding to stimulation at this time.  VSS upon leaving procedure room.

## 2024-02-24 NOTE — Progress Notes (Signed)
 "   GASTROENTEROLOGY PROCEDURE H&P NOTE   Primary Care Physician: Duanne Butler DASEN, MD    Reason for Procedure:   Hx of colon polyps  Plan:    colonoscopy  Patient is appropriate for endoscopic procedure(s) in the ambulatory ([facility]) setting.  The nature of the procedure, as well as the risks, benefits, and alternatives were carefully and thoroughly reviewed with the patient. Ample time for discussion and questions allowed.  All questions were answered. The patient understood, was satisfied, and agreed with the plan to proceed.    HPI: Joel Schaumburg. is a 74 y.o. male who presents for surveillance colonoscopy.  Medical history as below.  Tolerated the prep.  No recent chest pain or shortness of breath.  No abdominal pain today.  Past Medical History:  Diagnosis Date   Allergy    seasonal   Celiac disease    Cirrhosis (HCC)    Diabetes mellitus without complication (HCC)    Gastritis and duodenitis    on egd 12/2017   Hyperlipidemia    Patient denies   Hypertension    IDA (iron deficiency anemia)    Internal hemorrhoids    Nephrolithiasis    PVD (peripheral vascular disease)    decreased TBI in right foot.   Right buttock pain 07/29/2018   Sleep apnea    no cpap   Splenomegaly    Tubular adenoma of colon     Past Surgical History:  Procedure Laterality Date   COLONOSCOPY     JOINT REPLACEMENT Left    Dr Jane   POLYPECTOMY     SPINE SURGERY     disectomy   UPPER GASTROINTESTINAL ENDOSCOPY  12/16/2017    Prior to Admission medications  Medication Sig Start Date End Date Taking? Authorizing Provider  amLODipine  (NORVASC ) 5 MG tablet Take 1 tablet by mouth once daily 02/10/24  Yes Pickard, Butler DASEN, MD  aspirin  EC 81 MG tablet Take 1 tablet (81 mg total) by mouth daily. Swallow whole. 11/21/23  Yes Duanne Butler DASEN, MD  carvedilol  (COREG ) 6.25 MG tablet Take 1 tablet (6.25 mg total) by mouth 2 (two) times daily. 01/20/24  Yes Duanne Butler DASEN, MD  CONTOUR NEXT  TEST test strip USE 1 STRIP TO CHECK GLUCOSE ONCE DAILY 01/03/24  Yes Duanne Butler DASEN, MD  cyanocobalamin  (VITAMIN B12) 500 MCG tablet Take 1 tablet (500 mcg total) by mouth daily. 01/12/22  Yes Duanne Butler DASEN, MD  fluticasone  (FLONASE ) 50 MCG/ACT nasal spray Place 2 sprays into both nostrils daily. Patient taking differently: Place 2 sprays into both nostrils as needed. 05/14/23  Yes Duanne Butler DASEN, MD  losartan  (COZAAR ) 100 MG tablet Take 1 tablet by mouth once daily 02/03/24  Yes Pickard, Butler DASEN, MD  metFORMIN  (GLUCOPHAGE ) 1000 MG tablet TAKE 1 TABLET BY MOUTH TWICE DAILY WITH A MEAL . APPOINTMENT REQUIRED FOR FUTURE REFILLS 09/30/23  Yes Duanne Butler DASEN, MD  rosuvastatin  (CRESTOR ) 20 MG tablet Take 1 tablet (20 mg total) by mouth daily. 01/20/24  Yes Duanne Butler DASEN, MD  ketoconazole (NIZORAL) 2 % cream Apply topically 2 (two) times daily. Patient taking differently: Apply topically as needed. 05/02/20   [provider]  Semaglutide , 1 MG/DOSE, 4 MG/3ML SOPN Inject 1 mg as directed once a week. Patient taking differently: Inject 2 mg as directed once a week. 03/21/23   Duanne Butler DASEN, MD  tamsulosin  (FLOMAX ) 0.4 MG CAPS capsule Take 1 capsule (0.4 mg total) by mouth daily. Patient taking  differently: Take 0.4 mg by mouth as needed. 09/20/22   Duanne Butler DASEN, MD    Current Outpatient Medications  Medication Sig Dispense Refill   amLODipine  (NORVASC ) 5 MG tablet Take 1 tablet by mouth once daily 90 tablet 0   aspirin  EC 81 MG tablet Take 1 tablet (81 mg total) by mouth daily. Swallow whole. 90 tablet 1   carvedilol  (COREG ) 6.25 MG tablet Take 1 tablet (6.25 mg total) by mouth 2 (two) times daily. 180 tablet 1   CONTOUR NEXT TEST test strip USE 1 STRIP TO CHECK GLUCOSE ONCE DAILY 100 each 0   cyanocobalamin  (VITAMIN B12) 500 MCG tablet Take 1 tablet (500 mcg total) by mouth daily. 90 tablet 3   fluticasone  (FLONASE ) 50 MCG/ACT nasal spray Place 2 sprays into both nostrils  daily. (Patient taking differently: Place 2 sprays into both nostrils as needed.) 16 g 2   losartan  (COZAAR ) 100 MG tablet Take 1 tablet by mouth once daily 90 tablet 0   metFORMIN  (GLUCOPHAGE ) 1000 MG tablet TAKE 1 TABLET BY MOUTH TWICE DAILY WITH A MEAL . APPOINTMENT REQUIRED FOR FUTURE REFILLS 180 tablet 1   rosuvastatin  (CRESTOR ) 20 MG tablet Take 1 tablet (20 mg total) by mouth daily. 90 tablet 1   ketoconazole (NIZORAL) 2 % cream Apply topically 2 (two) times daily. (Patient taking differently: Apply topically as needed.)     Semaglutide , 1 MG/DOSE, 4 MG/3ML SOPN Inject 1 mg as directed once a week. (Patient taking differently: Inject 2 mg as directed once a week.) 3 mL 3   tamsulosin  (FLOMAX ) 0.4 MG CAPS capsule Take 1 capsule (0.4 mg total) by mouth daily. (Patient taking differently: Take 0.4 mg by mouth as needed.) 30 capsule 3   Current Facility-Administered Medications  Medication Dose Route Frequency Provider Last Rate Last Admin   0.9 %  sodium chloride  infusion  500 mL Intravenous Once Joel Reed, Joel HERO, MD        Allergies as of 02/24/2024 - Review Complete 02/24/2024  Allergen Reaction Noted   Gluten meal Other (See Comments) 03/12/2018    Family History  Problem Relation Age of Onset   Colon polyps Mother    Cerebral aneurysm Mother    Esophageal cancer Father    Breast cancer Sister    Bone cancer Sister    Cancer - Other Sister        cervical?   Cerebral aneurysm Sister    Heart attack Brother 20   Colon cancer Neg Hx    Rectal cancer Neg Hx    Stomach cancer Neg Hx     Social History   Socioeconomic History   Marital status: Married    Spouse name: Not on file   Number of children: 3   Years of education: Not on file   Highest education level: Not on file  Occupational History   Occupation: retired  Tobacco Use   Smoking status: Never   Smokeless tobacco: Never  Vaping Use   Vaping status: Never Used  Substance and Sexual Activity   Alcohol use: No     Comment: quit 1990   Drug use: No   Sexual activity: Yes  Other Topics Concern   Not on file  Social History Narrative   Not on file   Social Drivers of Health   Tobacco Use: Low Risk (02/21/2024)   Patient History    Smoking Tobacco Use: Never    Smokeless Tobacco Use: Never    Passive Exposure: Not  on file  Financial Resource Strain: Low Risk (03/28/2023)   Overall Financial Resource Strain (CARDIA)    Difficulty of Paying Living Expenses: Not hard at all  Food Insecurity: No Food Insecurity (03/28/2023)   Hunger Vital Sign    Worried About Running Out of Food in the Last Year: Never true    Ran Out of Food in the Last Year: Never true  Transportation Needs: No Transportation Needs (03/28/2023)   PRAPARE - Administrator, Civil Service (Medical): No    Lack of Transportation (Non-Medical): No  Physical Activity: Insufficiently Active (03/28/2023)   Exercise Vital Sign    Days of Exercise per Week: 3 days    Minutes of Exercise per Session: 30 min  Stress: No Stress Concern Present (03/28/2023)   [employer] of Occupational Health - Occupational Stress Questionnaire    Feeling of Stress : Not at all  Social Connections: Moderately Isolated (03/28/2023)   Social Connection and Isolation Panel    Frequency of Communication with Friends and Family: More than three times a week    Frequency of Social Gatherings with Friends and Family: More than three times a week    Attends Religious Services: Never    Database Administrator or Organizations: No    Attends Banker Meetings: Never    Marital Status: Married  Catering Manager Violence: Not At Risk (03/28/2023)   Humiliation, Afraid, Rape, and Kick questionnaire    Fear of Current or Ex-Partner: No    Emotionally Abused: No    Physically Abused: No    Sexually Abused: No  Depression (PHQ2-9): Low Risk (07/18/2023)   Depression (PHQ2-9)    PHQ-2 Score: 0  Alcohol Screen: Low Risk (03/28/2023)    Alcohol Screen    Last Alcohol Screening Score (AUDIT): 0  Housing: Unknown (03/28/2023)   Housing Stability Vital Sign    Unable to Pay for Housing in the Last Year: No    Number of Times Moved in the Last Year: Not on file    Homeless in the Last Year: No  Utilities: Not At Risk (03/28/2023)   AHC Utilities    Threatened with loss of utilities: No  Health Literacy: Adequate Health Literacy (03/28/2023)   B1300 Health Literacy    Frequency of need for help with medical instructions: Never    Physical Exam: Vital signs in last 24 hours: @BP  (!) 157/77   Pulse 77   Temp (!) 97.4 F (36.3 C)   Ht 5' 8 (1.727 m)   Wt 244 lb (110.7 kg)   SpO2 97%   BMI 37.10 kg/m  GEN: NAD EYE: Sclerae anicteric ENT: MMM CV: Non-tachycardic Pulm: CTA b/l GI: Soft, NT/ND NEURO:  Alert & Oriented x 3   Joel Starch, MD [facility]  02/24/2024 9:29 AM  "

## 2024-02-24 NOTE — Op Note (Signed)
 [facility] Patient Name: Joel Reed Procedure Date: 02/24/2024 9:35 AM MRN: [id number] Endoscopist: Gordy CHRISTELLA Starch , MD, [id number] Age: 74 Referring MD:  Date of Birth: 08-07-1950 Gender: Male Account #: [id number] Procedure:                Colonoscopy Indications:              High risk colon cancer surveillance: Personal                            history of multiple adenomas, Last colonoscopy:                            November 2019 (TA x 1), 2018 (TA x 4) Medicines:                Monitored Anesthesia Care Procedure:                Pre-Anesthesia Assessment:                           - Prior to the procedure, a History and Physical                            was performed, and patient medications and                            allergies were reviewed. The patient's tolerance of                            previous anesthesia was also reviewed. The risks                            and benefits of the procedure and the sedation                            options and risks were discussed with the patient.                            All questions were answered, and informed consent                            was obtained. Prior Anticoagulants: The patient has                            taken no anticoagulant or antiplatelet agents. ASA                            Grade Assessment: III - A patient with severe                            systemic disease. After reviewing the risks and                            benefits, the patient was deemed in satisfactory  condition to undergo the procedure.                           After obtaining informed consent, the colonoscope                            was passed under direct vision. Throughout the                            procedure, the patient's blood pressure, pulse, and                            oxygen saturations were monitored continuously. The                            Colonoscope was introduced  through the anus and                            advanced to the cecum, identified by appendiceal                            orifice and ileocecal valve. The colonoscopy was                            performed without difficulty. The patient tolerated                            the procedure well. The quality of the bowel                            preparation was good. The ileocecal valve,                            appendiceal orifice, and rectum were photographed. Scope In: 9:44:43 AM Scope Out: 10:01:42 AM Scope Withdrawal Time: 0 hours 14 minutes 19 seconds  Total Procedure Duration: 0 hours 16 minutes 59 seconds  Findings:                 The digital rectal exam was normal.                           A 4 mm polyp was found in the ascending colon. The                            polyp was sessile. The polyp was removed with a                            cold snare. Resection and retrieval were complete.                           Multiple small patchy angioectasias with typical                            arborization were found in the sigmoid colon, in  the descending colon, in the transverse colon and                            in the ascending colon.                           Small, non-bleeding rectal varices were found.                           No additional abnormalities were found on                            retroflexion. Complications:            No immediate complications. Estimated Blood Loss:     Estimated blood loss: none. Impression:               - One 4 mm polyp in the ascending colon, removed                            with a cold snare. Resected and retrieved.                           - Multiple, small, nonbleeding colonic                            angioectasias.                           - Small rectal varix. Recommendation:           - Patient has a contact number available for                            emergencies. The signs and  symptoms of potential                            delayed complications were discussed with the                            patient. Return to normal activities tomorrow.                            Written discharge instructions were provided to the                            patient.                           - Resume previous diet.                           - Continue present medications.                           - Await pathology results.                           -  Repeat colonoscopy may be recommended for                            surveillance. The colonoscopy date will be                            determined after pathology results from today's                            exam become available for review. Gordy CHRISTELLA Starch, MD 02/24/2024 10:05:20 AM This report has been signed electronically.

## 2024-02-25 ENCOUNTER — Telehealth: Payer: Self-pay

## 2024-02-25 NOTE — Telephone Encounter (Signed)
" °  Follow up Call-     02/24/2024    9:04 AM  Call back number  Post procedure Call Back phone  # [phone]  Permission to leave phone message Yes     Patient questions:  Do you have a fever, pain , or abdominal swelling? No. Pain Score  0 *  Have you tolerated food without any problems? Yes.    Have you been able to return to your normal activities? Yes.    Do you have any questions about your discharge instructions: Diet   No. Medications  No. Follow up visit  No.  Do you have questions or concerns about your Care? No.  Actions: * If pain score is 4 or above: No action needed, pain <4.   "

## 2024-02-26 ENCOUNTER — Ambulatory Visit: Payer: Self-pay | Admitting: Internal Medicine

## 2024-02-26 LAB — SURGICAL PATHOLOGY
